# Patient Record
Sex: Female | Born: 1950 | Race: White | Hispanic: No | Smoking: Current every day smoker
Health system: Southern US, Community
[De-identification: ages and names within clinical notes are randomized; demographics above are authoritative.]

## PROBLEM LIST (undated history)

## (undated) DIAGNOSIS — M797 Fibromyalgia: Secondary | ICD-10-CM

---

## 2021-09-01 ENCOUNTER — Ambulatory Visit: Payer: Self-pay | Admitting: Nurse Practitioner

## 2021-09-07 ENCOUNTER — Inpatient Hospital Stay
Admission: EM | Admit: 2021-09-07 | Discharge: 2021-09-24 | DRG: 391 | Disposition: A | Discharge status: Home Health | Payer: Medicare Other | Attending: Family Medicine | Admitting: Family Medicine

## 2021-09-07 ENCOUNTER — Emergency Department: Payer: Medicare Other

## 2021-09-07 ENCOUNTER — Encounter: Payer: Self-pay | Admitting: Internal Medicine

## 2021-09-07 ENCOUNTER — Other Ambulatory Visit: Payer: Self-pay

## 2021-09-07 DIAGNOSIS — R4182 Altered mental status, unspecified: Secondary | ICD-10-CM | POA: Diagnosis not present

## 2021-09-07 DIAGNOSIS — I5032 Chronic diastolic (congestive) heart failure: Secondary | ICD-10-CM | POA: Diagnosis present

## 2021-09-07 DIAGNOSIS — K529 Noninfective gastroenteritis and colitis, unspecified: Secondary | ICD-10-CM | POA: Diagnosis not present

## 2021-09-07 DIAGNOSIS — J849 Interstitial pulmonary disease, unspecified: Secondary | ICD-10-CM | POA: Diagnosis present

## 2021-09-07 DIAGNOSIS — J984 Other disorders of lung: Secondary | ICD-10-CM | POA: Diagnosis present

## 2021-09-07 DIAGNOSIS — R319 Hematuria, unspecified: Secondary | ICD-10-CM | POA: Diagnosis not present

## 2021-09-07 DIAGNOSIS — R112 Nausea with vomiting, unspecified: Secondary | ICD-10-CM | POA: Diagnosis not present

## 2021-09-07 DIAGNOSIS — I493 Ventricular premature depolarization: Secondary | ICD-10-CM | POA: Diagnosis present

## 2021-09-07 DIAGNOSIS — I6503 Occlusion and stenosis of bilateral vertebral arteries: Secondary | ICD-10-CM | POA: Diagnosis present

## 2021-09-07 DIAGNOSIS — G9341 Metabolic encephalopathy: Secondary | ICD-10-CM | POA: Diagnosis present

## 2021-09-07 DIAGNOSIS — R932 Abnormal findings on diagnostic imaging of liver and biliary tract: Secondary | ICD-10-CM | POA: Diagnosis not present

## 2021-09-07 DIAGNOSIS — I776 Arteritis, unspecified: Secondary | ICD-10-CM | POA: Diagnosis present

## 2021-09-07 DIAGNOSIS — J441 Chronic obstructive pulmonary disease with (acute) exacerbation: Secondary | ICD-10-CM | POA: Diagnosis present

## 2021-09-07 DIAGNOSIS — J9811 Atelectasis: Secondary | ICD-10-CM | POA: Diagnosis present

## 2021-09-07 DIAGNOSIS — G0490 Encephalitis and encephalomyelitis, unspecified: Secondary | ICD-10-CM | POA: Diagnosis not present

## 2021-09-07 DIAGNOSIS — G894 Chronic pain syndrome: Secondary | ICD-10-CM | POA: Diagnosis present

## 2021-09-07 DIAGNOSIS — N390 Urinary tract infection, site not specified: Secondary | ICD-10-CM

## 2021-09-07 DIAGNOSIS — R1319 Other dysphagia: Secondary | ICD-10-CM | POA: Diagnosis not present

## 2021-09-07 DIAGNOSIS — F061 Catatonic disorder due to known physiological condition: Secondary | ICD-10-CM | POA: Diagnosis not present

## 2021-09-07 DIAGNOSIS — B9620 Unspecified Escherichia coli [E. coli] as the cause of diseases classified elsewhere: Secondary | ICD-10-CM | POA: Diagnosis present

## 2021-09-07 DIAGNOSIS — E0500 Thyrotoxicosis with diffuse goiter without thyrotoxic crisis or storm: Secondary | ICD-10-CM | POA: Diagnosis present

## 2021-09-07 DIAGNOSIS — I639 Cerebral infarction, unspecified: Secondary | ICD-10-CM

## 2021-09-07 DIAGNOSIS — I110 Hypertensive heart disease with heart failure: Secondary | ICD-10-CM | POA: Diagnosis present

## 2021-09-07 DIAGNOSIS — E43 Unspecified severe protein-calorie malnutrition: Secondary | ICD-10-CM | POA: Diagnosis not present

## 2021-09-07 DIAGNOSIS — I7143 Infrarenal abdominal aortic aneurysm, without rupture: Secondary | ICD-10-CM | POA: Diagnosis present

## 2021-09-07 DIAGNOSIS — E0590 Thyrotoxicosis, unspecified without thyrotoxic crisis or storm: Secondary | ICD-10-CM | POA: Diagnosis not present

## 2021-09-07 DIAGNOSIS — E872 Acidosis, unspecified: Secondary | ICD-10-CM | POA: Diagnosis present

## 2021-09-07 DIAGNOSIS — Z681 Body mass index (BMI) 19 or less, adult: Secondary | ICD-10-CM | POA: Diagnosis not present

## 2021-09-07 DIAGNOSIS — E860 Dehydration: Secondary | ICD-10-CM | POA: Diagnosis not present

## 2021-09-07 DIAGNOSIS — K821 Hydrops of gallbladder: Secondary | ICD-10-CM | POA: Diagnosis not present

## 2021-09-07 DIAGNOSIS — M47816 Spondylosis without myelopathy or radiculopathy, lumbar region: Secondary | ICD-10-CM | POA: Diagnosis present

## 2021-09-07 DIAGNOSIS — E870 Hyperosmolality and hypernatremia: Secondary | ICD-10-CM | POA: Diagnosis present

## 2021-09-07 DIAGNOSIS — Z20822 Contact with and (suspected) exposure to covid-19: Secondary | ICD-10-CM | POA: Diagnosis present

## 2021-09-07 DIAGNOSIS — T402X5A Adverse effect of other opioids, initial encounter: Secondary | ICD-10-CM | POA: Diagnosis present

## 2021-09-07 DIAGNOSIS — K5190 Ulcerative colitis, unspecified, without complications: Secondary | ICD-10-CM | POA: Diagnosis present

## 2021-09-07 DIAGNOSIS — R1011 Right upper quadrant pain: Secondary | ICD-10-CM | POA: Diagnosis not present

## 2021-09-07 DIAGNOSIS — I10 Essential (primary) hypertension: Secondary | ICD-10-CM

## 2021-09-07 DIAGNOSIS — Z1611 Resistance to penicillins: Secondary | ICD-10-CM | POA: Diagnosis present

## 2021-09-07 DIAGNOSIS — T17908A Unspecified foreign body in respiratory tract, part unspecified causing other injury, initial encounter: Secondary | ICD-10-CM

## 2021-09-07 DIAGNOSIS — E440 Moderate protein-calorie malnutrition: Secondary | ICD-10-CM | POA: Diagnosis not present

## 2021-09-07 DIAGNOSIS — T17908D Unspecified foreign body in respiratory tract, part unspecified causing other injury, subsequent encounter: Secondary | ICD-10-CM | POA: Diagnosis not present

## 2021-09-07 DIAGNOSIS — A0839 Other viral enteritis: Secondary | ICD-10-CM | POA: Diagnosis present

## 2021-09-07 DIAGNOSIS — D72829 Elevated white blood cell count, unspecified: Secondary | ICD-10-CM

## 2021-09-07 DIAGNOSIS — F1123 Opioid dependence with withdrawal: Secondary | ICD-10-CM | POA: Diagnosis present

## 2021-09-07 DIAGNOSIS — R197 Diarrhea, unspecified: Secondary | ICD-10-CM

## 2021-09-07 DIAGNOSIS — E876 Hypokalemia: Secondary | ICD-10-CM | POA: Diagnosis not present

## 2021-09-07 DIAGNOSIS — G9340 Encephalopathy, unspecified: Secondary | ICD-10-CM | POA: Diagnosis not present

## 2021-09-07 DIAGNOSIS — F17210 Nicotine dependence, cigarettes, uncomplicated: Secondary | ICD-10-CM | POA: Diagnosis present

## 2021-09-07 DIAGNOSIS — T17418A Gastric contents in trachea causing other injury, initial encounter: Secondary | ICD-10-CM | POA: Diagnosis present

## 2021-09-07 DIAGNOSIS — R0602 Shortness of breath: Secondary | ICD-10-CM

## 2021-09-07 DIAGNOSIS — N342 Other urethritis: Secondary | ICD-10-CM | POA: Diagnosis not present

## 2021-09-07 DIAGNOSIS — J00 Acute nasopharyngitis [common cold]: Secondary | ICD-10-CM | POA: Diagnosis present

## 2021-09-07 DIAGNOSIS — R079 Chest pain, unspecified: Secondary | ICD-10-CM

## 2021-09-07 DIAGNOSIS — B258 Other cytomegaloviral diseases: Secondary | ICD-10-CM | POA: Diagnosis present

## 2021-09-07 DIAGNOSIS — R0682 Tachypnea, not elsewhere classified: Secondary | ICD-10-CM | POA: Diagnosis present

## 2021-09-07 DIAGNOSIS — E785 Hyperlipidemia, unspecified: Secondary | ICD-10-CM | POA: Diagnosis present

## 2021-09-07 DIAGNOSIS — R1310 Dysphagia, unspecified: Secondary | ICD-10-CM | POA: Diagnosis not present

## 2021-09-07 DIAGNOSIS — Z79899 Other long term (current) drug therapy: Secondary | ICD-10-CM

## 2021-09-07 DIAGNOSIS — I4891 Unspecified atrial fibrillation: Secondary | ICD-10-CM | POA: Diagnosis not present

## 2021-09-07 DIAGNOSIS — M797 Fibromyalgia: Secondary | ICD-10-CM | POA: Diagnosis present

## 2021-09-07 DIAGNOSIS — D751 Secondary polycythemia: Secondary | ICD-10-CM | POA: Diagnosis present

## 2021-09-07 HISTORY — DX: Fibromyalgia: M79.7

## 2021-09-07 LAB — CBC WITH DIFFERENTIAL/PLATELET
Abs Immature Granulocytes: 0.06 10*3/uL (ref 0.00–0.07)
Basophils Absolute: 0 10*3/uL (ref 0.0–0.1)
Basophils Relative: 0 %
Eosinophils Absolute: 0 10*3/uL (ref 0.0–0.5)
Eosinophils Relative: 0 %
HCT: 47.1 % — ABNORMAL HIGH (ref 36.0–46.0)
Hemoglobin: 16.2 g/dL — ABNORMAL HIGH (ref 12.0–15.0)
Immature Granulocytes: 0 %
Lymphocytes Relative: 5 %
Lymphs Abs: 0.7 10*3/uL (ref 0.7–4.0)
MCH: 32 pg (ref 26.0–34.0)
MCHC: 34.4 g/dL (ref 30.0–36.0)
MCV: 92.9 fL (ref 80.0–100.0)
Monocytes Absolute: 0.5 10*3/uL (ref 0.1–1.0)
Monocytes Relative: 4 %
Neutro Abs: 12.2 10*3/uL — ABNORMAL HIGH (ref 1.7–7.7)
Neutrophils Relative %: 91 %
Platelets: 263 10*3/uL (ref 150–400)
RBC: 5.07 MIL/uL (ref 3.87–5.11)
RDW: 12.6 % (ref 11.5–15.5)
WBC: 13.7 10*3/uL — ABNORMAL HIGH (ref 4.0–10.5)
nRBC: 0 % (ref 0.0–0.2)
nRBC: 0 /100 WBC

## 2021-09-07 LAB — COMPREHENSIVE METABOLIC PANEL
ALT: 25 U/L (ref 0–44)
AST: 36 U/L (ref 15–41)
Albumin: 3.2 g/dL — ABNORMAL LOW (ref 3.5–5.0)
Alkaline Phosphatase: 144 U/L — ABNORMAL HIGH (ref 38–126)
Anion gap: 17 — ABNORMAL HIGH (ref 5–15)
BUN: 24 mg/dL — ABNORMAL HIGH (ref 8–23)
CO2: 23 mmol/L (ref 22–32)
Calcium: 9.4 mg/dL (ref 8.9–10.3)
Chloride: 104 mmol/L (ref 98–111)
Creatinine, Ser: 0.92 mg/dL (ref 0.44–1.00)
GFR, Estimated: >60 mL/min (ref >60)
Glucose, Bld: 117 mg/dL — ABNORMAL HIGH (ref 70–99)
Potassium: 2.7 mmol/L — CL (ref 3.5–5.1)
Sodium: 144 mmol/L (ref 135–145)
Total Bilirubin: 0.5 mg/dL (ref 0.3–1.2)
Total Protein: 7.6 g/dL (ref 6.5–8.1)

## 2021-09-07 LAB — GASTROINTESTINAL PANEL BY PCR, STOOL (REPLACES STOOL CULTURE)

## 2021-09-07 LAB — URINALYSIS, ROUTINE W REFLEX MICROSCOPIC
Bilirubin Urine: NEGATIVE
Glucose, UA: NEGATIVE mg/dL
Ketones, ur: 20 mg/dL — AB
Nitrite: POSITIVE — AB
Protein, ur: 30 mg/dL — AB
Specific Gravity, Urine: 1.012 (ref 1.005–1.030)
WBC, UA: >50 WBC/hpf — ABNORMAL HIGH (ref 0–5)
pH: 6 (ref 5.0–8.0)

## 2021-09-07 LAB — C DIFFICILE QUICK SCREEN W PCR REFLEX
C Diff antigen: NEGATIVE
C Diff interpretation: NOT DETECTED
C Diff toxin: NEGATIVE

## 2021-09-07 LAB — CBC
HCT: 47.7 % — ABNORMAL HIGH (ref 36.0–46.0)
Hemoglobin: 16.3 g/dL — ABNORMAL HIGH (ref 12.0–15.0)
MCH: 31.8 pg (ref 26.0–34.0)
MCHC: 34.2 g/dL (ref 30.0–36.0)
MCV: 93.2 fL (ref 80.0–100.0)
Platelets: 259 10*3/uL (ref 150–400)
RBC: 5.12 MIL/uL — ABNORMAL HIGH (ref 3.87–5.11)
RDW: 12.5 % (ref 11.5–15.5)
WBC: 13.6 10*3/uL — ABNORMAL HIGH (ref 4.0–10.5)
nRBC: 0 % (ref 0.0–0.2)

## 2021-09-07 LAB — RESP PANEL BY RT-PCR (FLU A&B, COVID) ARPGX2
Influenza A by PCR: NEGATIVE
Influenza B by PCR: NEGATIVE
SARS Coronavirus 2 by RT PCR: NEGATIVE

## 2021-09-07 LAB — LIPASE, BLOOD: Lipase: 26 U/L (ref 11–51)

## 2021-09-07 LAB — TROPONIN I (HIGH SENSITIVITY)
Troponin I (High Sensitivity): 45 ng/L — ABNORMAL HIGH (ref <18)
Troponin I (High Sensitivity): 48 ng/L — ABNORMAL HIGH (ref <18)

## 2021-09-07 LAB — LACTIC ACID, PLASMA: Lactic Acid, Venous: 1.3 mmol/L (ref 0.5–1.9)

## 2021-09-07 LAB — MAGNESIUM: Magnesium: 1.9 mg/dL (ref 1.7–2.4)

## 2021-09-07 LAB — SEDIMENTATION RATE: Sed Rate: 44 mm/hr — ABNORMAL HIGH (ref 0–30)

## 2021-09-07 IMAGING — DX DG CHEST 1V PORT
1 series · 1 of 1 positions shown · non-contrast
Comparison: None.

CLINICAL DATA: Altered mental status.  Weakness.

EXAM:
PORTABLE CHEST 1 VIEW

[chest ap]
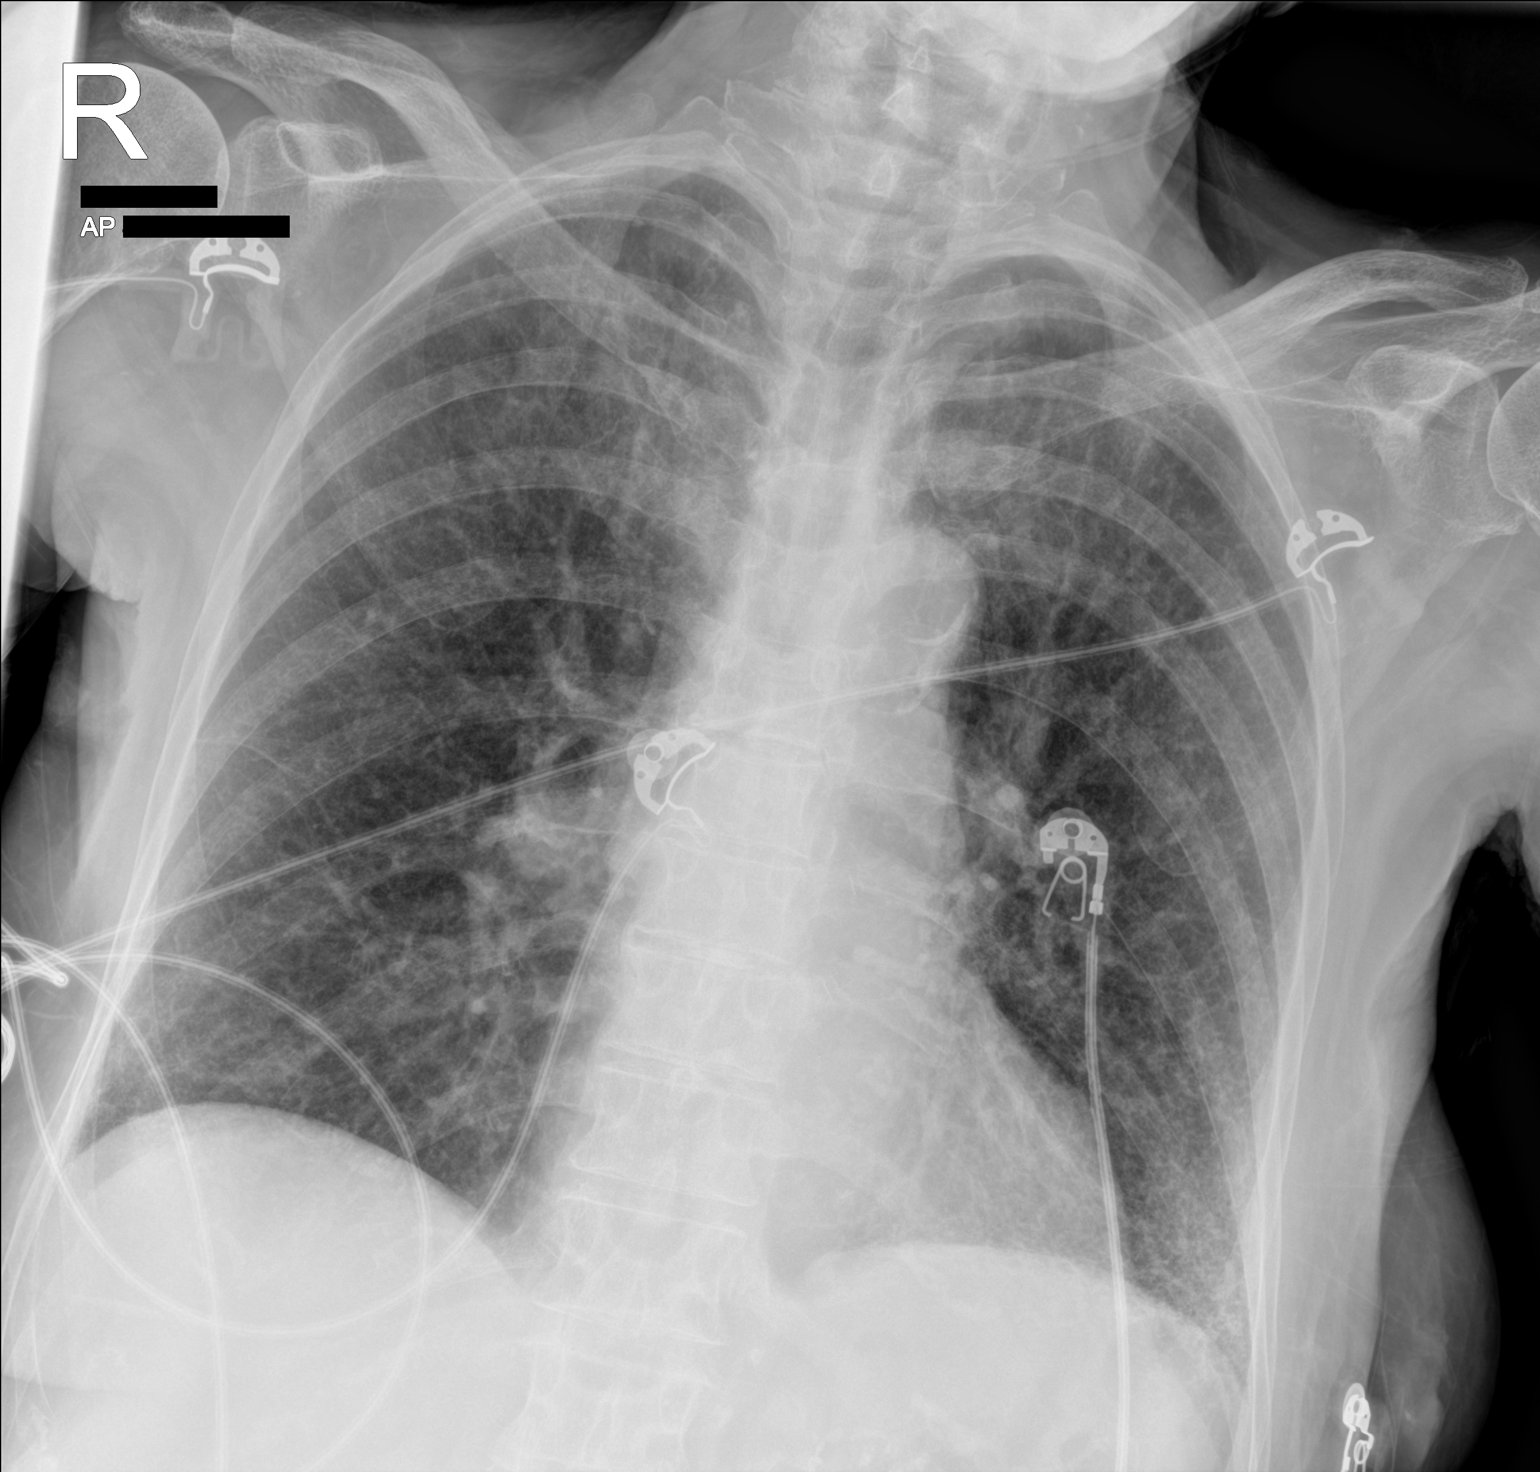

[1 of 1 positions shown; findings below may reference images not displayed]

FINDINGS: [PC] hours. Lungs are hyperexpanded. Interstitial markings are
diffusely coarsened with chronic features. Subtle disease at the
left base may reflect chronic interstitial changes although subtle
pneumonia not excluded. The cardiopericardial silhouette is within
normal limits for size. The visualized bony structures of the thorax
are unremarkable. Telemetry leads overlie the chest.
IMPRESSION: 1. Hyperexpansion with chronic interstitial coarsening.
2. Subtle opacity at the left base may reflect chronic interstitial
changes although subtle pneumonia not excluded.

## 2021-09-07 IMAGING — US US ABDOMEN LIMITED
1 series · 14 of 25 positions shown · non-contrast
Comparison: Abdominopelvic CT same date.

CLINICAL DATA: Right upper quadrant abdominal pain.

EXAM:
ULTRASOUND ABDOMEN LIMITED RIGHT UPPER QUADRANT

[Series 1: us abdomen limited ruq (liver/gb) · 14 of 116 slices shown]
[im 1/116]
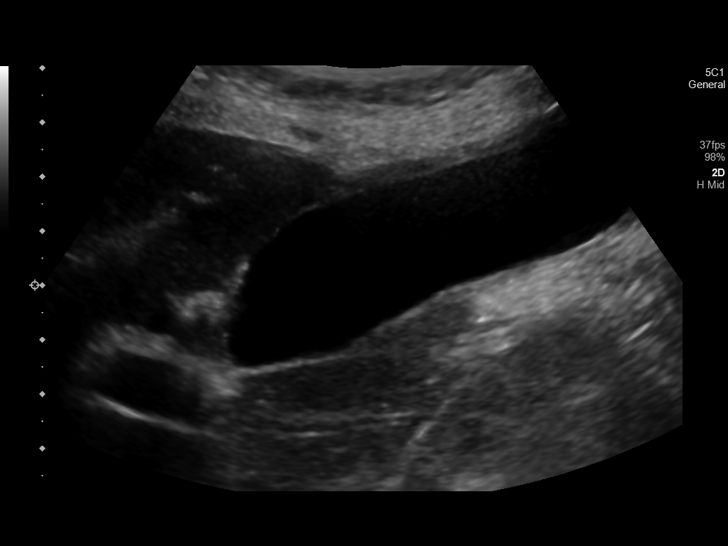
[im 10/116]
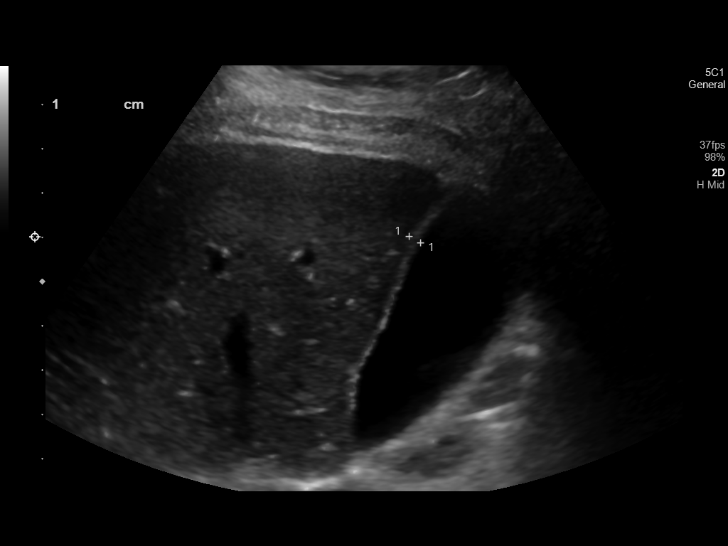
[im 20/116]
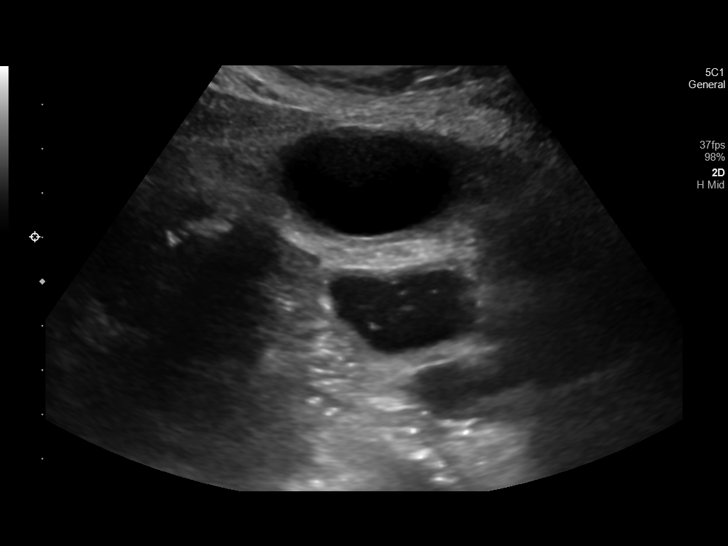
[im 29/116]
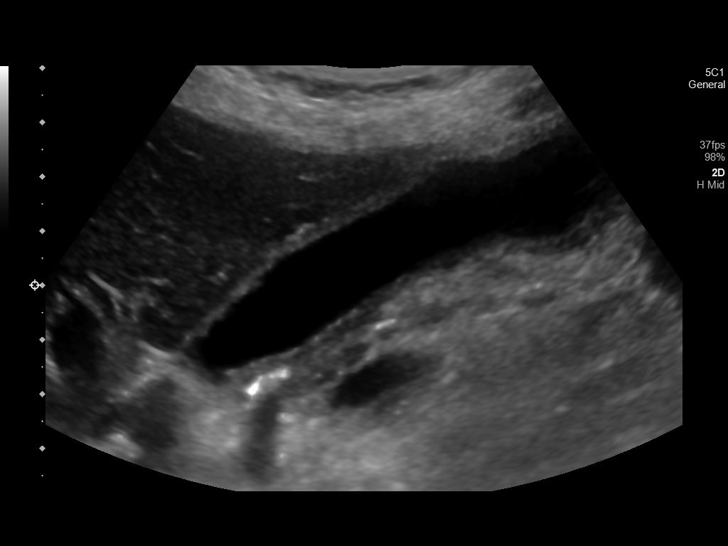
[im 39/116]
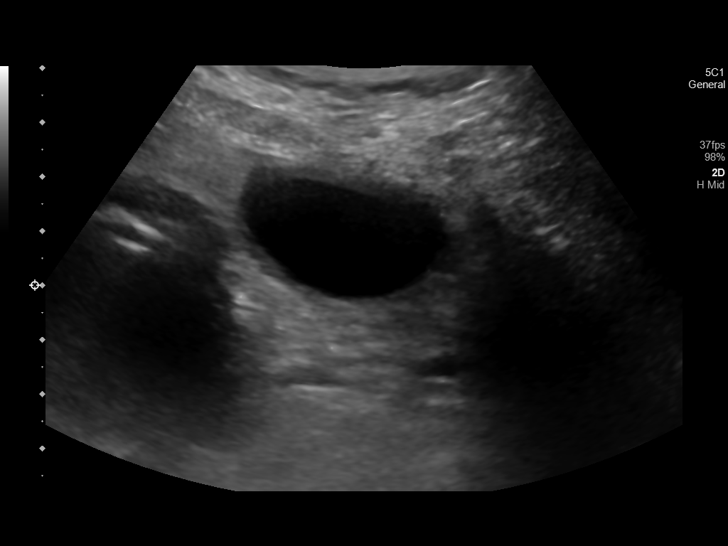
[im 44/116]
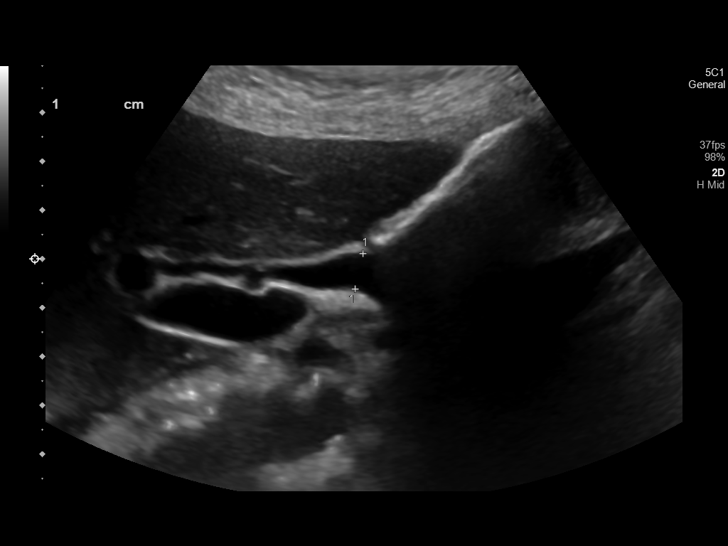
[im 53/116]
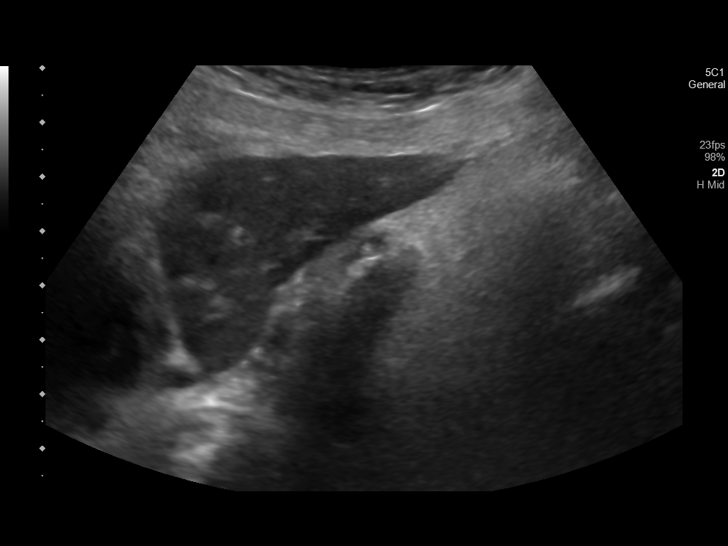
[im 63/116]
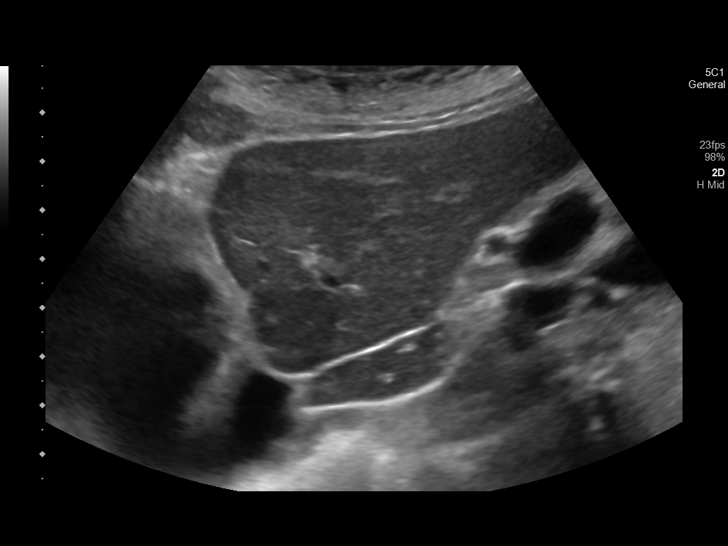
[im 72/116]
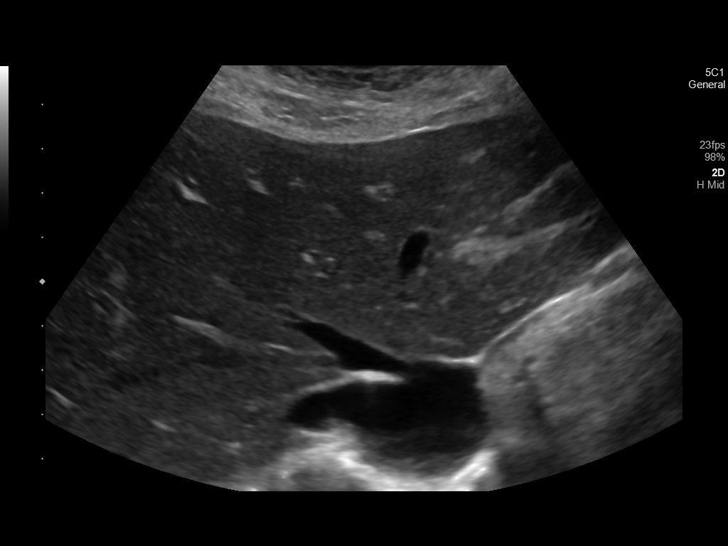
[im 77/116]
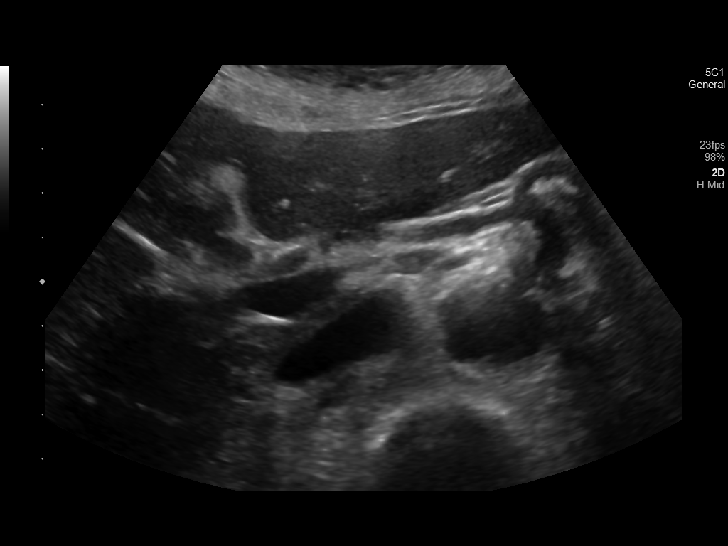
[im 87/116]
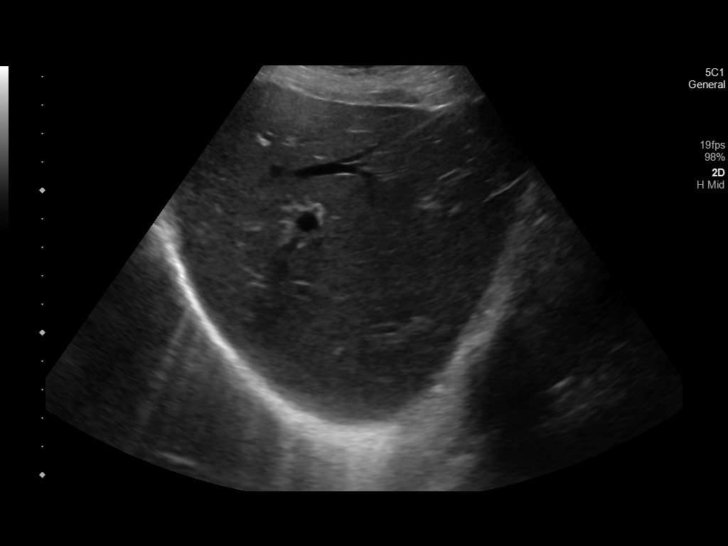
[im 96/116]
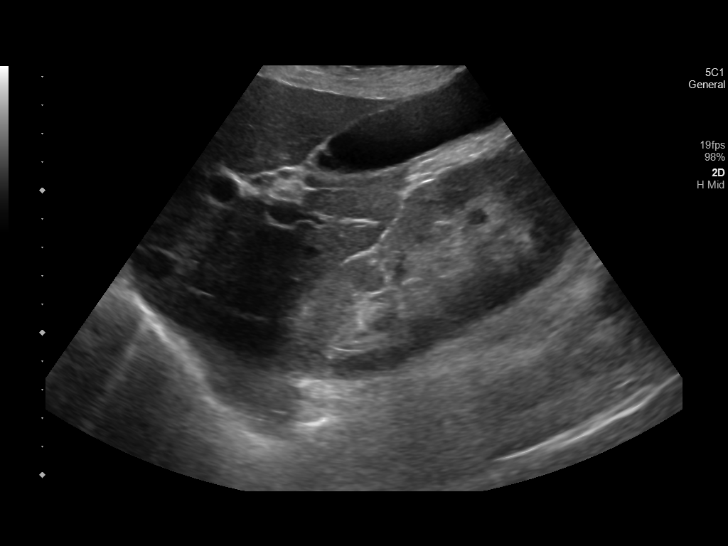
[im 106/116]
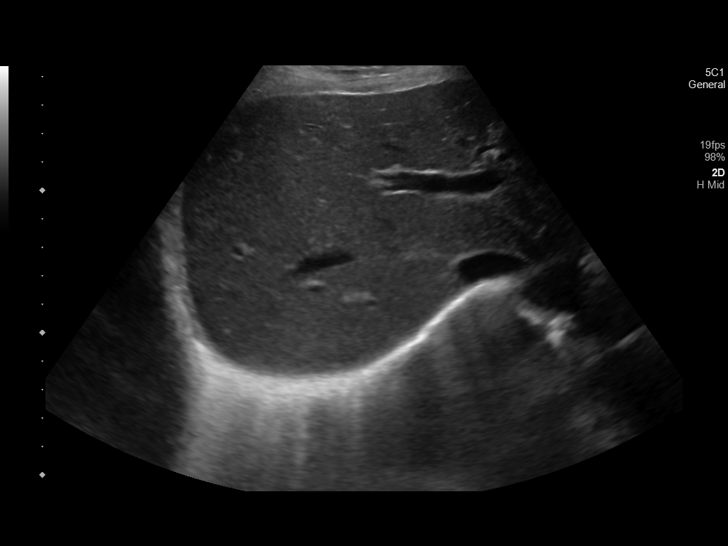
[im 116/116]
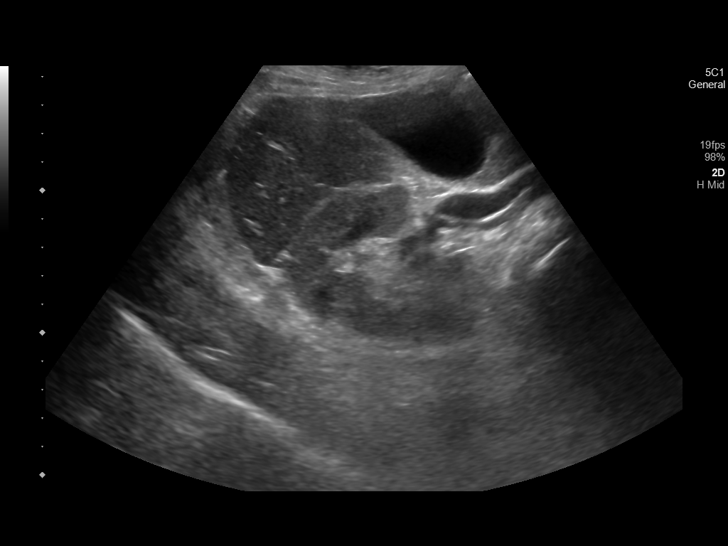

[14 of 25 positions shown; findings below may reference images not displayed]

FINDINGS: Gallbladder:

Borderline gallbladder wall thickening to 3-4 mm. The gallbladder is
mildly distended, measuring up to 8.7 cm in length. No evidence of
cholelithiasis, pericholecystic fluid or sonographic Murphy sign.

Common bile duct:

Diameter: 8 mm. The common bile duct tapers distally. No evidence of
choledocholithiasis or intrahepatic biliary dilatation.

Liver:

No focal lesion identified. Within normal limits in parenchymal
echogenicity. Portal vein is patent on color Doppler imaging with
normal direction of blood flow towards the liver.

Other: None.
IMPRESSION: 1. Nonspecific borderline gallbladder hydrops with borderline
gallbladder wall thickening. No evidence of cholelithiasis or
sonographic Murphy sign.
2. Mild extrahepatic biliary dilatation without evidence of
choledocholithiasis.

## 2021-09-07 IMAGING — CT CT HEAD W/O CM
4 series · 16 of 47 positions shown, 18 images · non-contrast
Comparison: None.

CLINICAL DATA: Nausea, vomiting and diarrhea, altered mental status



[Series 2: head wo · axial · 0.41mm/px · z∈[+231,+356]mm · 7 of 35 slices shown, 9 images]
[im 5/35  brain]
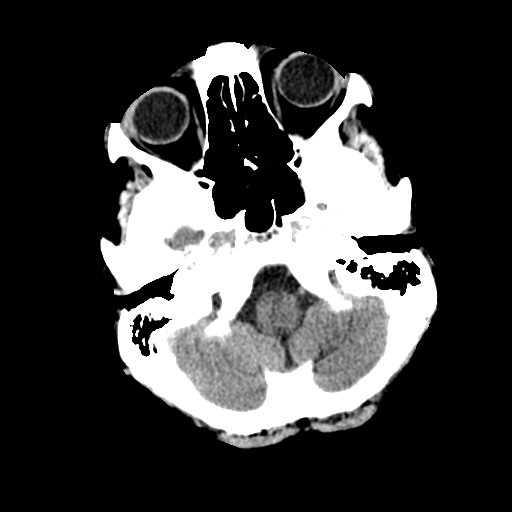
[im 5/35  bone]
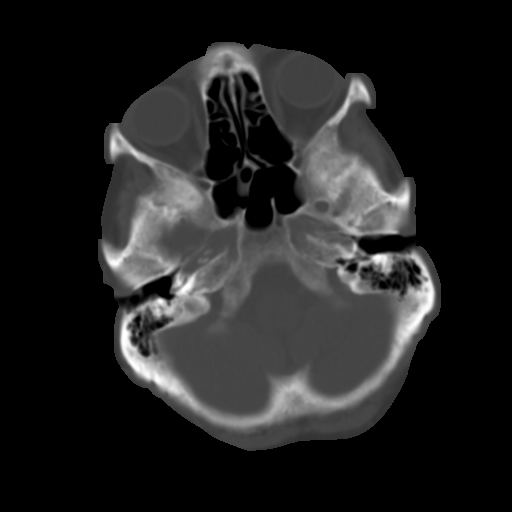
[im 9/35  brain]
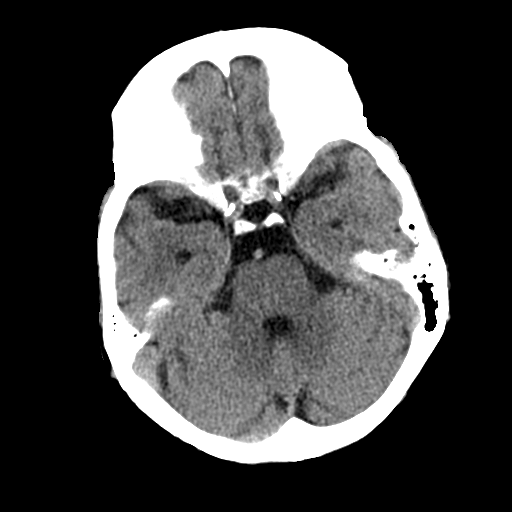
[im 13/35  brain]
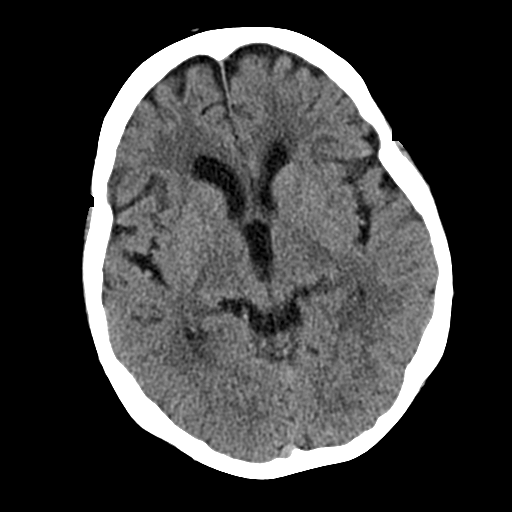
[im 18/35  brain]
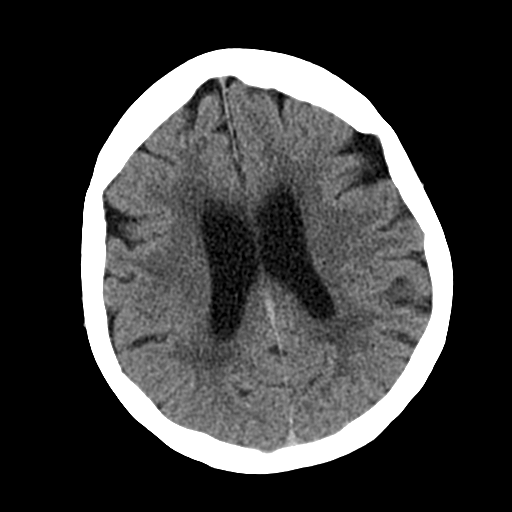
[im 22/35  brain]
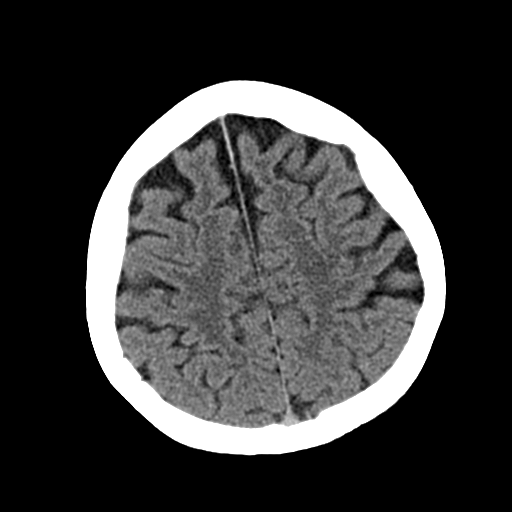
[im 22/35  bone]
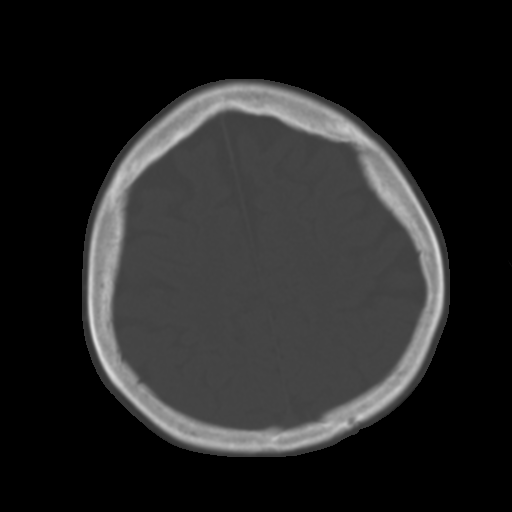
[im 26/35  brain]
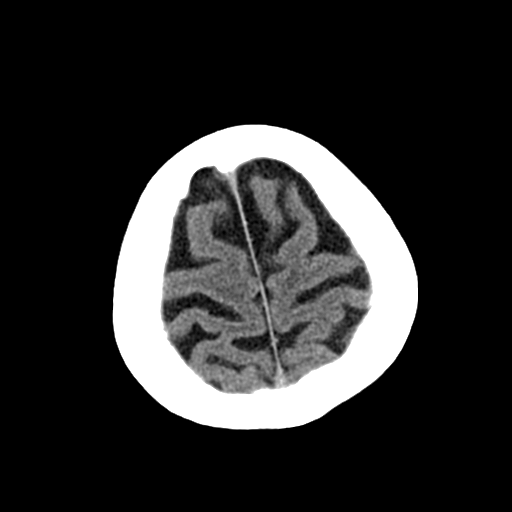
[im 30/35  brain]
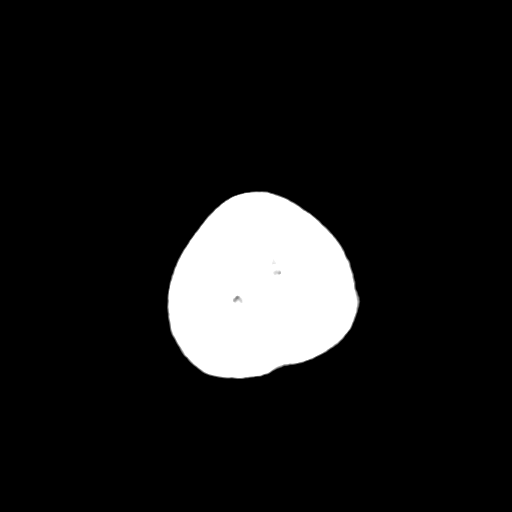

[Series 3: head bone · axial · 0.41mm/px · z∈[+227,+261]mm · 3 of 86 slices shown]
[im 9/86  bone]
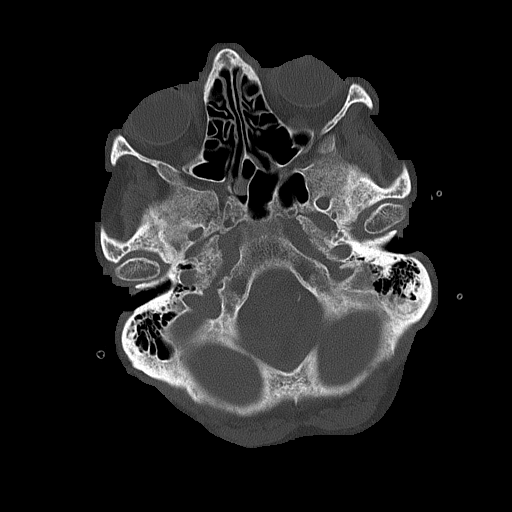
[im 18/86  bone]
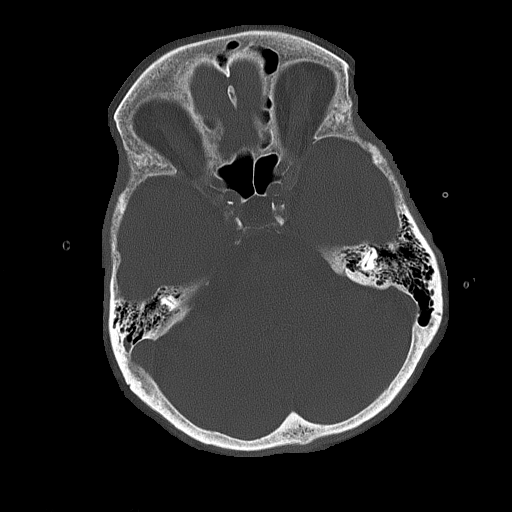
[im 26/86  bone]
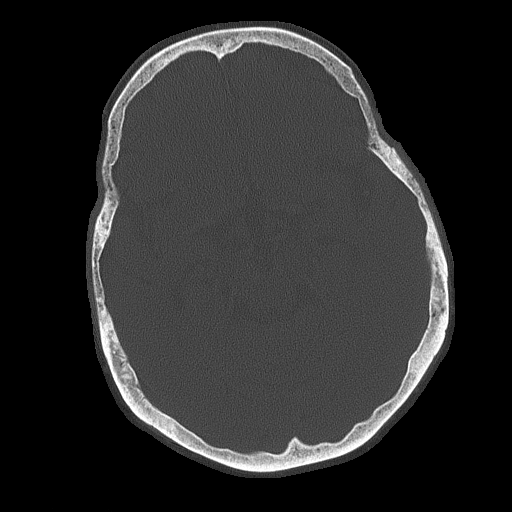

[Series 4: coronal soft tissue · coronal · 0.32mm/px · 3 of 63 slices shown]
[im 21/63  brain]
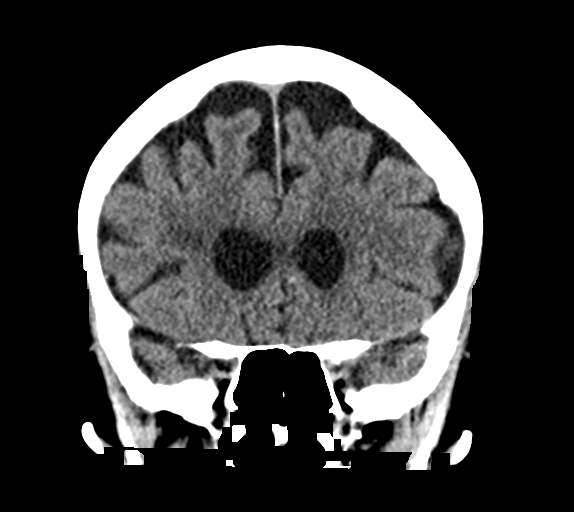
[im 28/63  brain]
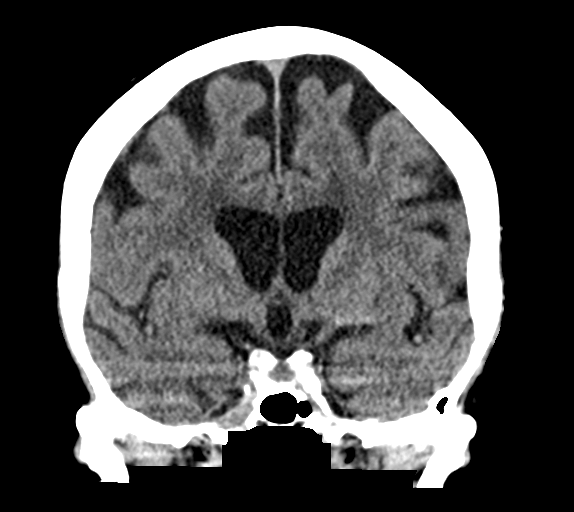
[im 35/63  brain]
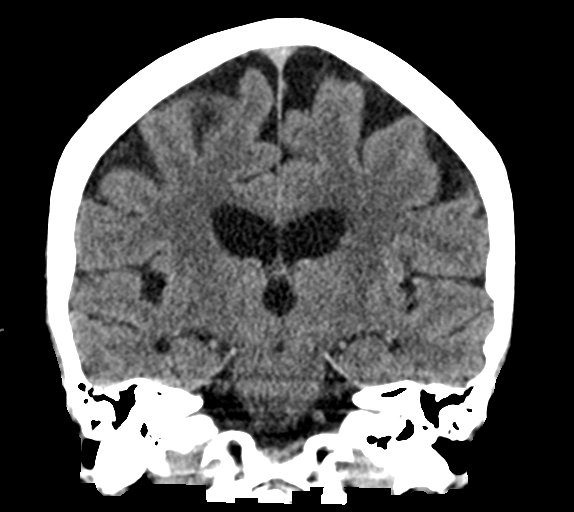

[Series 5: sagittal soft tissue · sagittal · 0.33mm/px · 3 of 57 slices shown]
[im 19/57  brain]
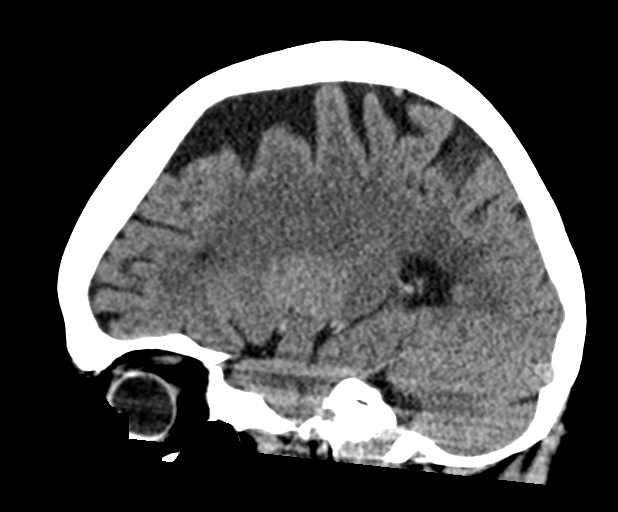
[im 29/57  brain]
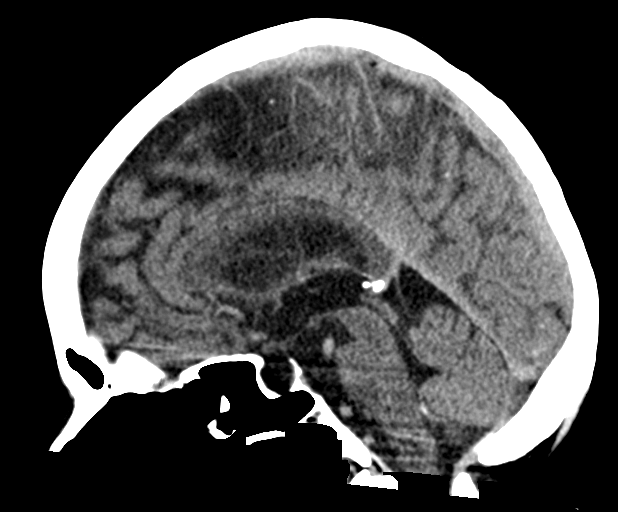
[im 38/57  brain]
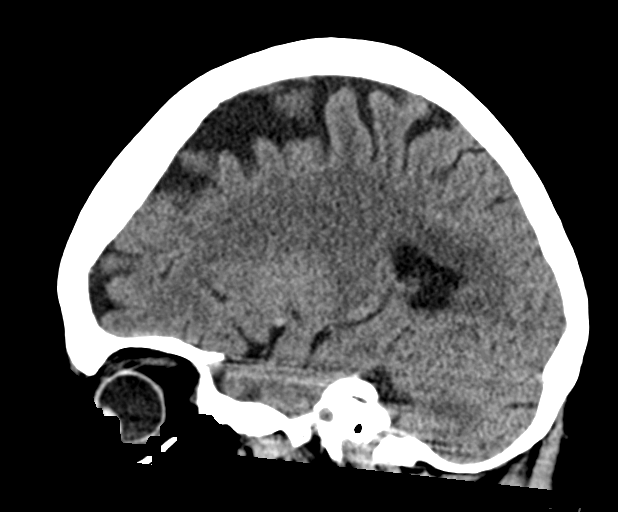

[16 of 47 positions shown; findings below may reference images not displayed]

FINDINGS: Brain: Mild atrophy pattern and white matter microvascular ischemic
changes. No acute intracranial hemorrhage, new mass lesion, new
infarction, midline shift, herniation, hydrocephalus, or extra-axial
fluid collection. No cerebellar abnormality.

Vascular: Intracranial atherosclerosis. No hyperdense vessel.

Skull: Normal. Negative for fracture or focal lesion.

Sinuses/Orbits: No acute finding.

Other: None.
IMPRESSION: 1. Mild atrophy and white matter microvascular ischemic changes.
2. No acute intracranial abnormality by noncontrast CT.

## 2021-09-07 IMAGING — CT CT ABD-PELV W/ CM
2 of 5 series · 15 of 46 positions shown, 17 images · IV contrast (APPLIED)
Comparison: None.

CLINICAL DATA: Abdominal pain

EXAM:
CT ABDOMEN AND PELVIS WITH CONTRAST
TECHNIQUE: Multidetector CT imaging of the abdomen and pelvis was performed
using the standard protocol following bolus administration of
intravenous contrast.

[Series 3: routine abd/pel with · axial · 0.83mm/px · z∈[-495,-95]mm · 12 of 92 slices shown, 14 images]
[im 6/92  soft-tissue]
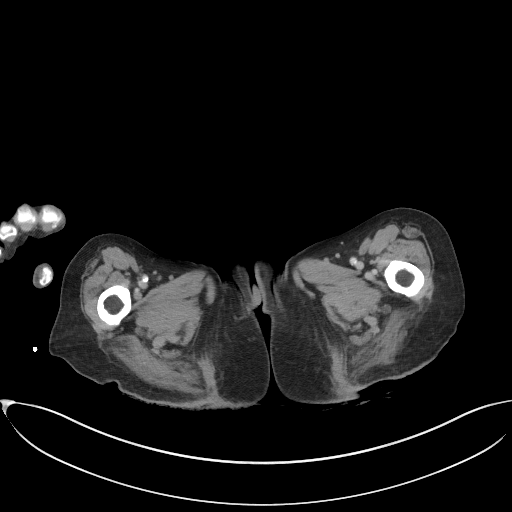
[im 6/92  bone]
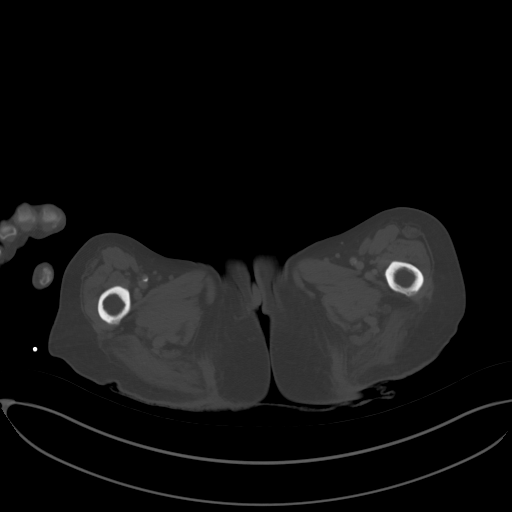
[im 16/92  soft-tissue]
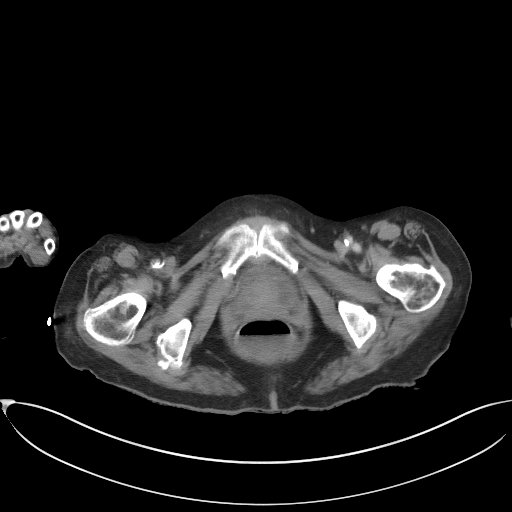
[im 21/92  soft-tissue]
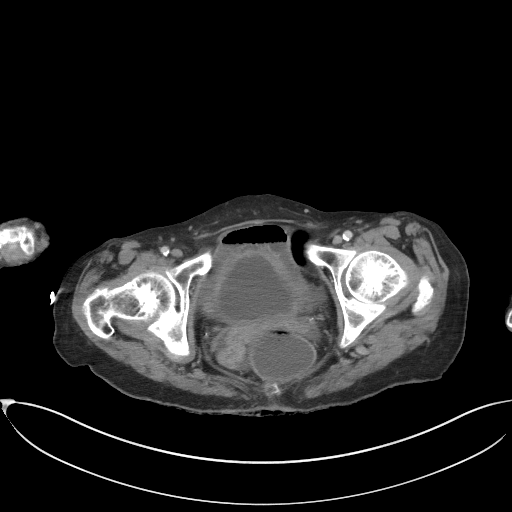
[im 26/92  soft-tissue]
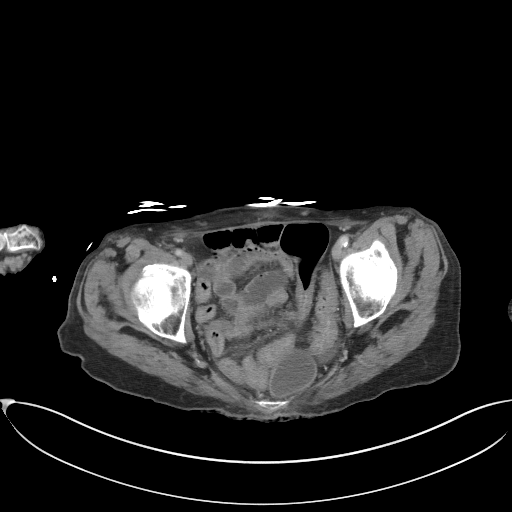
[im 36/92  soft-tissue]
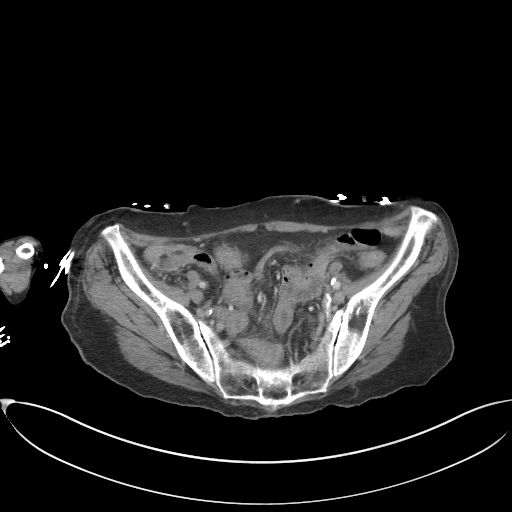
[im 41/92  soft-tissue]
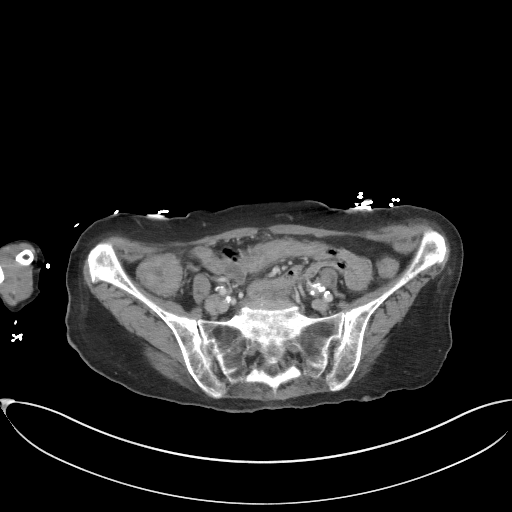
[im 51/92  soft-tissue]
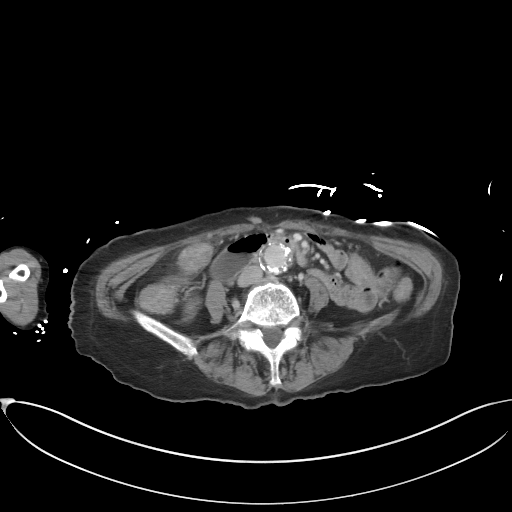
[im 56/92  soft-tissue]
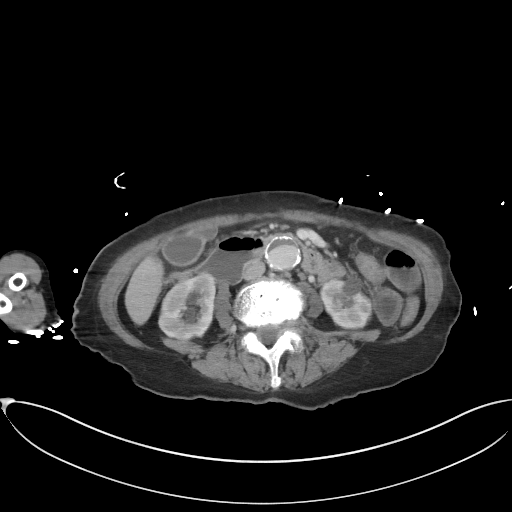
[im 66/92  soft-tissue]
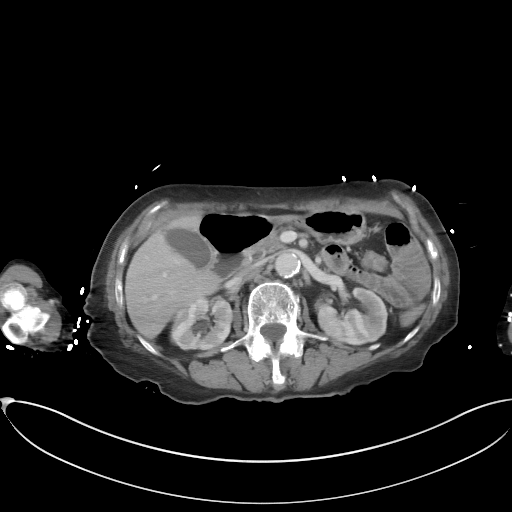
[im 66/92  bone]
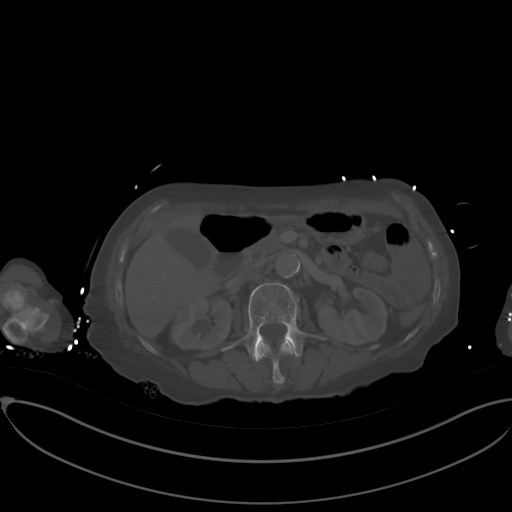
[im 71/92  soft-tissue]
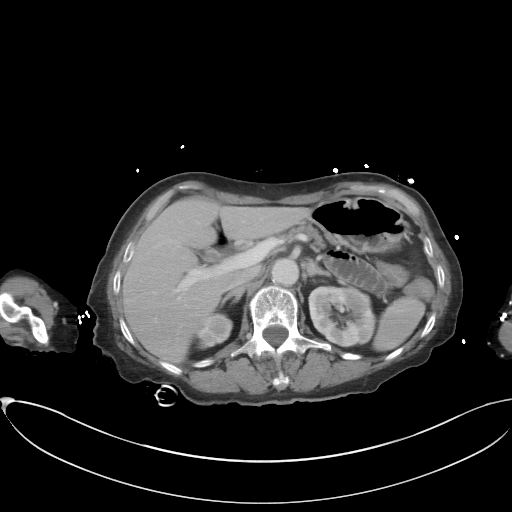
[im 76/92  soft-tissue]
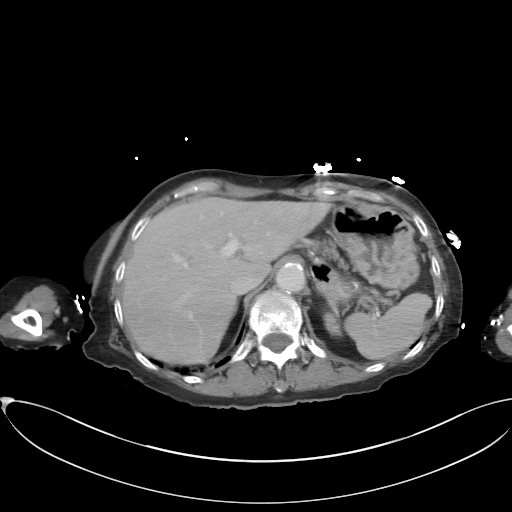
[im 86/92  soft-tissue]
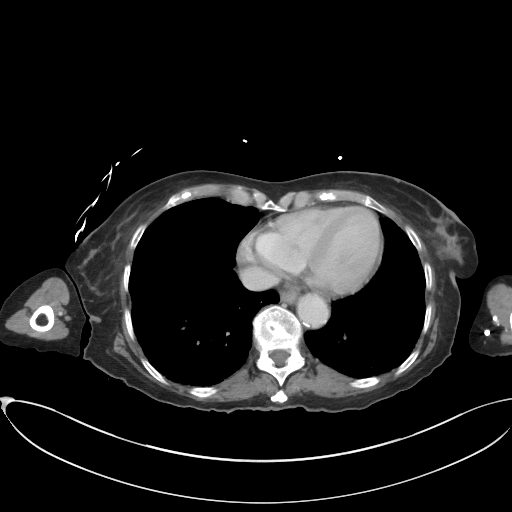

[Series 7: coronal st · coronal · 0.76mm/px · 3 of 74 slices shown]
[im 25/74  soft-tissue]
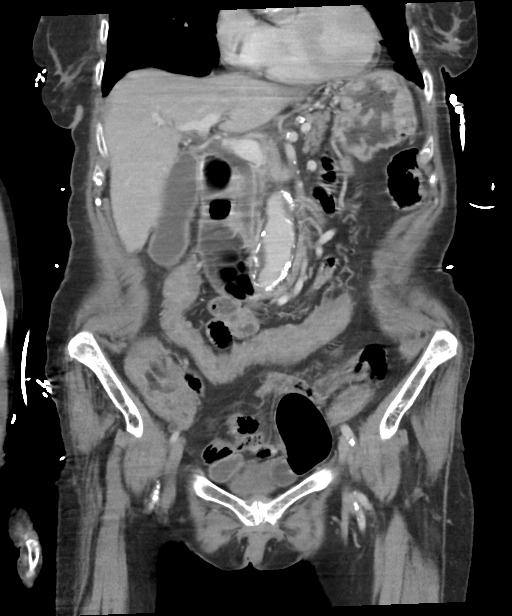
[im 33/74  soft-tissue]
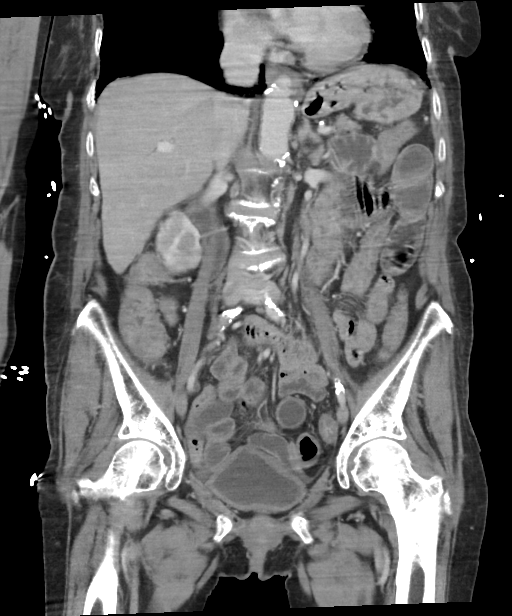
[im 41/74  soft-tissue]
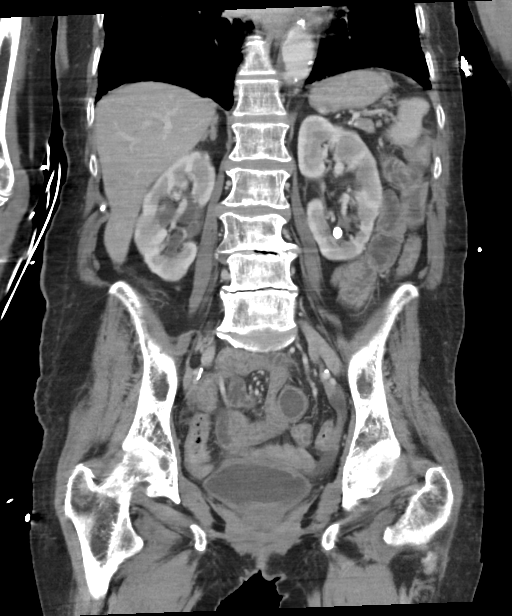

[15 of 46 positions shown; findings below may reference images not displayed]

RADIATION DOSE REDUCTION: This exam was performed according to the
departmental dose-optimization program which includes automated
exposure control, adjustment of the mA and/or kV according to
patient size and/or use of iterative reconstruction technique.

CONTRAST:  80mL OMNIPAQUE IOHEXOL 300 MG/ML  SOLN
FINDINGS: Lower chest: Increased interstitial markings in the periphery of
both lower lung fields may suggest scarring.

Hepatobiliary: No focal abnormality is seen in the liver.
Gallbladder is distended. There is mild diffuse wall thickening in
the gallbladder. There is no fluid around the gallbladder. Distal
common bile duct in the head of the pancreas measures 8 mm in
diameter.

Pancreas: There is atrophy.  No focal abnormality is seen.

Spleen: Unremarkable.

Adrenals/Urinary Tract: Adrenals are unremarkable. There is no
hydronephrosis. There are scattered calcifications in the right
renal artery branches. No definite renal or ureteral stones are
seen. Urinary bladder is not distended. There is mild diffuse wall
thickening in the bladder which may be due to incomplete distention
or cystitis.

Stomach/Bowel: Stomach is unremarkable. There is mild dilation of
proximal small bowel loops. There is mild diffuse wall thickening in
the distal small bowel loops. Appendix is not dilated. There is
diffuse wall thickening in the colon. Liquid stool is seen in the
the lumen of rectosigmoid.

Vascular/Lymphatic: There are scattered atherosclerotic plaques and
calcifications. There is ectasia of infrarenal abdominal aorta
measuring 3.4 x 3.3 cm. There is no demonstrable retroperitoneal
hematoma. There are coarse calcifications in the major branches of
aorta. There is possible high-grade stenosis in right common iliac
artery

Reproductive: Uterus is not seen.

Other: There is no pneumoperitoneum. Minimal amount of free fluid is
seen in the pelvis, possibly due to inflammation of distal small
bowel loops and colon.

Musculoskeletal: Degenerative changes are noted in the lumbar spine
with disc space narrowing, bony spurs, facet hypertrophy and
encroachment of neural foramina at multiple levels, more so at L3-L4
L4-L5 and L5-S1 levels.
IMPRESSION: There is diffuse wall thickening in colon. There is diffuse wall
thickening in the distal small bowel loops. Findings suggest
inflammatory or infectious enterocolitis.

There is no evidence of intestinal obstruction or pneumoperitoneum.
There is no hydronephrosis.

Gallbladder is distended with wall thickening. This may be due to
chronic cholecystitis. If there are focal symptoms in the right
upper quadrant, gallbladder sonogram may be considered.

There is 3.4 cm infrarenal aortic aneurysm. Lumbar spondylosis with
encroachment of neural foramina from L3-S1 levels.

Other findings as described in the body of the report.

## 2021-09-07 MED ORDER — CEFTRIAXONE SODIUM 1 G IJ SOLR
1.0000 g | Freq: Once | INTRAMUSCULAR | Status: AC
  Administered 2021-09-07: 1 g via INTRAVENOUS
  Filled 2021-09-07: qty 10

## 2021-09-07 MED ORDER — ALBUTEROL SULFATE (2.5 MG/3ML) 0.083% IN NEBU
3.0000 mL | INHALATION_SOLUTION | Freq: Four times a day (QID) | RESPIRATORY_TRACT | Status: DC | PRN
  Filled 2021-09-07: qty 3

## 2021-09-07 MED ORDER — SODIUM CHLORIDE 0.9 % IV SOLN
500.0000 mg | Freq: Once | INTRAVENOUS | Status: AC
  Administered 2021-09-07: 500 mg via INTRAVENOUS
  Filled 2021-09-07: qty 5

## 2021-09-07 MED ORDER — ONDANSETRON HCL 4 MG PO TABS: 4.0000 mg | ORAL_TABLET | Freq: Four times a day (QID) | ORAL | Status: DC | PRN

## 2021-09-07 MED ORDER — SODIUM CHLORIDE 0.9 % IV SOLN
2.0000 g | INTRAVENOUS | Status: AC
  Administered 2021-09-08 – 2021-09-13 (×6): 2 g via INTRAVENOUS
  Filled 2021-09-07 (×2): qty 2
  Filled 2021-09-07 (×4): qty 20

## 2021-09-07 MED ORDER — METRONIDAZOLE 500 MG/100ML IV SOLN
500.0000 mg | Freq: Two times a day (BID) | INTRAVENOUS | Status: DC
  Administered 2021-09-07 – 2021-09-08 (×2): 500 mg via INTRAVENOUS
  Filled 2021-09-07 (×4): qty 100

## 2021-09-07 MED ORDER — PANTOPRAZOLE SODIUM 40 MG IV SOLR
40.0000 mg | INTRAVENOUS | Status: DC
  Administered 2021-09-07 – 2021-09-22 (×16): 40 mg via INTRAVENOUS
  Filled 2021-09-07 (×15): qty 10

## 2021-09-07 MED ORDER — METRONIDAZOLE 500 MG/100ML IV SOLN
500.0000 mg | Freq: Once | INTRAVENOUS | Status: AC
  Administered 2021-09-07: 500 mg via INTRAVENOUS
  Filled 2021-09-07: qty 100

## 2021-09-07 MED ORDER — LACTATED RINGERS IV BOLUS
1000.0000 mL | Freq: Once | INTRAVENOUS | Status: AC
  Administered 2021-09-07: 1000 mL via INTRAVENOUS

## 2021-09-07 MED ORDER — ONDANSETRON HCL 4 MG/2ML IJ SOLN
4.0000 mg | Freq: Four times a day (QID) | INTRAMUSCULAR | Status: DC | PRN
  Administered 2021-09-07 – 2021-09-10 (×3): 4 mg via INTRAVENOUS
  Filled 2021-09-07 (×3): qty 2

## 2021-09-07 MED ORDER — IOHEXOL 300 MG/ML  SOLN
80.0000 mL | Freq: Once | INTRAMUSCULAR | Status: AC | PRN
  Administered 2021-09-07: 80 mL via INTRAVENOUS

## 2021-09-07 MED ORDER — POTASSIUM CHLORIDE 2 MEQ/ML IV SOLN
INTRAVENOUS | Status: DC
  Filled 2021-09-07 (×6): qty 1000

## 2021-09-07 MED ORDER — ATORVASTATIN CALCIUM 20 MG PO TABS
40.0000 mg | ORAL_TABLET | Freq: Every day | ORAL | Status: DC
  Administered 2021-09-07 – 2021-09-18 (×12): 40 mg via ORAL
  Filled 2021-09-07 (×12): qty 2

## 2021-09-07 MED ORDER — ENOXAPARIN SODIUM 40 MG/0.4ML IJ SOSY
40.0000 mg | PREFILLED_SYRINGE | INTRAMUSCULAR | Status: DC
  Administered 2021-09-07: 40 mg via SUBCUTANEOUS
  Filled 2021-09-07: qty 0.4

## 2021-09-07 MED ORDER — POTASSIUM CHLORIDE 10 MEQ/100ML IV SOLN
10.0000 meq | Freq: Once | INTRAVENOUS | Status: AC
  Administered 2021-09-07: 10 meq via INTRAVENOUS
  Filled 2021-09-07: qty 100

## 2021-09-07 NOTE — ED Notes (Signed)
Pt had large watery BM. Unable to obtain sample to send to lab. Patient cleaned. New lien and and chuck pad placed at this time. Warm blankets proved. ?

## 2021-09-07 NOTE — H&P (Signed)
History and Physical    Patient: Joy Clark GQQ:[id number] DOB: [birth year] DOA: 09/07/2021 DOS: the patient was seen and examined on 09/07/2021 PCP: Pcp, No  Patient coming from: Home  Chief Complaint:  Chief Complaint  Patient presents with   Emesis   Diarrhea   Most of the history was obtained from patient's daughter at the bedside.  HPI: Joy Clark is a 71 y.o. female with medical history significant for nicotine dependence, chronic pain syndrome, dyslipidemia who was brought into the ER by EMS for evaluation of mental status changes for about 4 days. Patient is visiting her daughter here in [place] and has been here for about 4 weeks.  At baseline she is usually awake, alert and oriented to person place and time.  Patient's daughter states that her symptoms started about 4 days ago with chills and confusion.  Since then she has had multiple episodes of emesis and diarrhea and is now incontinent of stool which is loose and has lots of mucus in it.  Patient's oral intake has been very poor over the last 4 days as well. She is awake and oriented to person but is unable to follow commands. I am unable to do review of systems on this patient due to her mental status changes.  Review of Systems: unable to review all systems due to the inability of the patient to answer questions. Past Medical History:  Diagnosis Date   Fibromyalgia    History reviewed. No pertinent surgical history. Social History:  reports that she has been smoking cigarettes. She does not have any smokeless tobacco history on file. No history on file for alcohol use and drug use.  Not on File  History reviewed. No pertinent family history.  Prior to Admission medications   Medication Sig Start Date End Date Taking? Authorizing Provider  albuterol (VENTOLIN HFA) 108 (90 Base) MCG/ACT inhaler Inhale into the lungs every 6 (six) hours as needed for wheezing or shortness of breath.   Yes [provider]   atorvastatin (LIPITOR) 40 MG tablet Take 40 mg by mouth daily.   Yes [provider]  carboxymethylcellulose 1 % ophthalmic solution 1 drop 3 (three) times daily.   Yes [provider]  celecoxib (CELEBREX) 200 MG capsule Take 200 mg by mouth 2 (two) times daily.   Yes [provider]  gabapentin (NEURONTIN) 800 MG tablet Take 800 mg by mouth 3 (three) times daily.   Yes [provider]  omeprazole (PRILOSEC) 40 MG capsule Take 40 mg by mouth daily.   Yes [provider]  ondansetron (ZOFRAN) 8 MG tablet Take by mouth every 8 (eight) hours as needed for nausea or vomiting.   Yes [provider]  oxyCODONE-acetaminophen (PERCOCET) 10-325 MG tablet Take 1 tablet by mouth every 4 (four) hours as needed for pain.   Yes [provider]    Physical Exam: Vitals:   09/07/21 1211 09/07/21 1249 09/07/21 1324 09/07/21 1423  BP: (!) 148/82 (!) 184/74 (!) 162/109 (!) 165/76  Pulse: (!) 112 (!) 103 (!) 103 99  Resp: 19 20 (!) 25 (!) 21  Temp:      TempSrc:      SpO2: 98% 98% 97% 96%  Weight:      Height:       Physical Exam Vitals and nursing note reviewed.  Constitutional:      Appearance: She is normal weight.     Comments: Oriented only to person not to place or time.  Unable to follow commands  HENT:     Head: Normocephalic and atraumatic.     Nose: Nose normal.     Mouth/Throat:     Mouth: Mucous membranes are dry.  Eyes:     Pupils: Pupils are equal, round, and reactive to light.  Cardiovascular:     Rate and Rhythm: Tachycardia present.  Pulmonary:     Effort: Pulmonary effort is normal.  Abdominal:     General: Abdomen is flat.     Palpations: Abdomen is soft.     Tenderness: There is abdominal tenderness.     Comments: Right upper quadrant tenderness  Musculoskeletal:        General: Normal range of motion.     Cervical back: Normal range of motion and neck supple.  Skin:    General: Skin is warm and dry.   Neurological:     Mental Status: She is alert.     Comments: Unable to assess.  Patient not following commands  Psychiatric:     Comments: Unable to assess     Data Reviewed: Relevant notes from primary care and specialist visits, past discharge summaries as available in EHR, including Care Everywhere. Prior diagnostic testing as pertinent to current admission diagnoses Updated medications and problem lists for reconciliation ED course, including vitals, labs, imaging, treatment and response to treatment Triage notes, nursing and pharmacy notes and ED provider's notes Notable results as noted in HPI Labs reviewed white count 13.7, hemoglobin 16.2, hematocrit 47.1, platelet count 263, potassium 2.7, BUN 24, lactic acid 1.3, troponin 45 >> 48, magnesium 1.9 Urine analysis shows significant pyuria Chest x-ray reviewed by me shows hyperinflated lung fields CT scan of the head without contrast shows Mild atrophy and white matter microvascular ischemic changes. No acute intracranial abnormality by noncontrast CT. CT scan of abdomen and pelvis shows diffuse wall thickening in colon. There is diffuse wall thickening in the distal small bowel loops. Findings suggest inflammatory or infectious enterocolitis. There is no evidence of intestinal obstruction or pneumoperitoneum. There is no hydronephrosis. Gallbladder is distended with wall thickening. This may be due to chronic cholecystitis. If there are focal symptoms in the right upper quadrant, gallbladder sonogram may be considered. 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 Gallbladder ultrasound shows Nonspecific borderline gallbladder hydrops with borderline gallbladder wall thickening. No evidence of cholelithiasis or sonographic Murphy sign. Mild extrahepatic biliary dilatation without evidence of choledocholithiasis. Twelve-lead EKG reviewed by me shows sinus  tachycardia with PVCs. There are no new results to review at this time.  Assessment and Plan: * Acute metabolic encephalopathy- (present on admission) Patient presents for evaluation of mental status changes over the last 4 days prior to her admission. She is awake and is only oriented to person not place or time. Altered mental status appears to be metabolic and may be secondary to underlying UTI Expect improvement in patient's mental status following resolution of acute infection and treatment of acute illness Patient may need an MRI of the brain if her mental status does not improve.  Hypokalemia- (present on admission) Secondary to GI losses from nausea, vomiting and diarrhea Supplement potassium Check magnesium levels  UTI (urinary tract infection)- (present on admission) Patient has significant pyuria We will treat empirically with Rocephin until urine culture results become available  Enterocolitis- (present on admission) Patient has a history of C. Difficile She has diarrhea but has not had recent antibiotic therapy We will send  stool for C. difficile toxin Treat empirically with Rocephin and Flagyl       Advance Care Planning:   Code Status: Full Code   Consults: None  Family Communication: Greater than 50% of time was spent discussing patient's condition and plan of care with her daughter at the bedside.  All questions and concerns have been addressed.  She verbalizes understanding and agrees to the plan  Severity of Illness: The appropriate patient status for this patient is INPATIENT. Inpatient status is judged to be reasonable and necessary in order to provide the required intensity of service to ensure the patient's safety. The patient's presenting symptoms, physical exam findings, and initial radiographic and laboratory data in the context of their chronic comorbidities is felt to place them at high risk for further clinical deterioration. Furthermore, it is not  anticipated that the patient will be medically stable for discharge from the hospital within 2 midnights of admission.   * I certify that at the point of admission it is my clinical judgment that the patient will require inpatient hospital care spanning beyond 2 midnights from the point of admission due to high intensity of service, high risk for further deterioration and high frequency of surveillance required.*  Author: Lucile Shutters, MD 09/07/2021 3:27 PM  For on call review [email or web address].

## 2021-09-07 NOTE — ED Notes (Signed)
Secure chat message sent to inpatient RN Despina Hidden for hand off report at this time; ?

## 2021-09-07 NOTE — ED Provider Notes (Signed)
? ?[facility] ?Provider Note ? ? ? Event Date/Time  ? First MD Initiated Contact with Patient 09/07/21 1117   ?  (approximate) ? ? ?History  ? ?Emesis and Diarrhea ? ? ?HPI ? ?Khadeja Abt is a 71 y.o. female who is very confused.  Daughter reports that she has a past history of hypertension, she had C. difficile 4 years ago.  She had a rib out of place that was hurting and a abdominal aortic aneurysm.  She had some upper back pain prior to last Friday that she thought maybe was from the rib problem.  On Friday she started with nausea and vomiting and diarrhea and became altered.  She is speaking but not anywhere near as much as usual she appears to be very confused.  For me she cannot answer any questions although she says I do not know very clearly.  She is moving all extremities equally and well. ? ?  ? ? ?Physical Exam  ? ?Triage Vital Signs: ?ED Triage Vitals  ?Enc Vitals Group  ?   BP 09/07/21 1111 (!) 184/95  ?   Pulse Rate 09/07/21 1111 (!) 114  ?   Resp 09/07/21 1111 (!) 25  ?   Temp 09/07/21 1111 98.3 ?F (36.8 ?C)  ?   Temp Source 09/07/21 1111 Axillary  ?   SpO2 09/07/21 1111 96 %  ?   Weight 09/07/21 1106 140 lb (63.5 kg)  ?   Height 09/07/21 1106 5\' 10"  (1.778 m)  ?   Head Circumference --   ?   Peak Flow --   ?   Pain Score --   ?   Pain Loc --   ?   Pain Edu? --   ?   Excl. in GC? --   ? ? ?Most recent vital signs: ?Vitals:  ? 09/07/21 1423 09/07/21 1545  ?BP: (!) 165/76 (!) 148/79  ?Pulse: 99 (!) 107  ?Resp: (!) 21 18  ?Temp:    ?SpO2: 96% 98%  ? ? ? ?General: Awake, alert but cannot answer any questions.  She looks uncomfortable ?Head normocephalic atraumatic eyes pupils equal round reactive ?CV:  Good peripheral perfusion.  Heart regular rate and rhythm no audible murmurs ?Resp:  Normal effort.  Lungs sound clear ?Abd:  No distention.  Abdomen appears to be somewhat tender ?Extremities moving all extremities equally and well no edema ? ? ?ED Results / Procedures / Treatments   ? ?Labs ?(all labs ordered are listed, but only abnormal results are displayed) ?Labs Reviewed  ?COMPREHENSIVE METABOLIC PANEL - Abnormal; Notable for the following components:  ?    Result Value  ? Potassium 2.7 (*)   ? Glucose, Bld 117 (*)   ? BUN 24 (*)   ? Albumin 3.2 (*)   ? Alkaline Phosphatase 144 (*)   ? Anion gap 17 (*)   ? All other components within normal limits  ?CBC - Abnormal; Notable for the following components:  ? WBC 13.6 (*)   ? RBC 5.12 (*)   ? Hemoglobin 16.3 (*)   ? HCT 47.7 (*)   ? All other components within normal limits  ?URINALYSIS, ROUTINE W REFLEX MICROSCOPIC - Abnormal; Notable for the following components:  ? Color, Urine YELLOW (*)   ? APPearance HAZY (*)   ? Hgb urine dipstick MODERATE (*)   ? Ketones, ur 20 (*)   ? Protein, ur 30 (*)   ? Nitrite POSITIVE (*)   ?  Leukocytes,Ua LARGE (*)   ? WBC, UA >50 (*)   ? Bacteria, UA MANY (*)   ? All other components within normal limits  ?CBC WITH DIFFERENTIAL/PLATELET - Abnormal; Notable for the following components:  ? WBC 13.7 (*)   ? Hemoglobin 16.2 (*)   ? HCT 47.1 (*)   ? Neutro Abs 12.2 (*)   ? All other components within normal limits  ?SEDIMENTATION RATE - Abnormal; Notable for the following components:  ? Sed Rate 44 (*)   ? All other components within normal limits  ?TROPONIN I (HIGH SENSITIVITY) - Abnormal; Notable for the following components:  ? Troponin I (High Sensitivity) 45 (*)   ? All other components within normal limits  ?TROPONIN I (HIGH SENSITIVITY) - Abnormal; Notable for the following components:  ? Troponin I (High Sensitivity) 48 (*)   ? All other components within normal limits  ?RESP PANEL BY RT-PCR (FLU A&B, COVID) ARPGX2  ?C DIFFICILE QUICK SCREEN W PCR REFLEX    ?GASTROINTESTINAL PANEL BY PCR, STOOL (REPLACES STOOL CULTURE)  ?CULTURE, BLOOD (SINGLE)  ?LIPASE, BLOOD  ?MAGNESIUM  ?LACTIC ACID, PLASMA  ?DIFFERENTIAL  ? ? ? ?EKG ? ? ? ? ?RADIOLOGY ?Chest x-ray shows possible pneumonia reviewed the film ?CT of the  head shows no acute disease I reviewed those films ?CT of the abdomen and pelvis shows an abdominal aortic aneurysm colitis and thickening of the wall of the gallbladder.  I reviewed those films ultrasound shows what appears to be gallbladder hydrops per radiology I reviewed those films as well. ? ? ? ? ? ?PROCEDURES: ? ?Critical Care performed: Critical care time 30 minutes.  This includes speaking to the patient's daughter reviewing the patient's findings ordering additional tests and reviewing those findings.  Additionally I looked at the patient's old records and old lab work ? ?Procedures ? ? ?MEDICATIONS ORDERED IN ED: ?Medications  ?lactated ringers 1,000 mL with potassium chloride 40 mEq infusion (has no administration in time range)  ?atorvastatin (LIPITOR) tablet 40 mg (has no administration in time range)  ?albuterol (PROVENTIL) (2.5 MG/3ML) 0.083% nebulizer solution 3 mL (has no administration in time range)  ?enoxaparin (LOVENOX) injection 40 mg (has no administration in time range)  ?ondansetron [facility]) tablet 4 mg ( Oral See Alternative 09/07/21 1516)  ?  Or  ?ondansetron [facility]) injection 4 mg (4 mg Intravenous Given 09/07/21 1516)  ?cefTRIAXone (ROCEPHIN) 2 g in sodium chloride 0.9 % 100 mL IVPB (has no administration in time range)  ?metroNIDAZOLE (FLAGYL) IVPB 500 mg (has no administration in time range)  ?pantoprazole (PROTONIX) injection 40 mg (has no administration in time range)  ?potassium chloride 10 mEq in 100 mL IVPB (0 mEq Intravenous Stopped 09/07/21 1344)  ?lactated ringers bolus 1,000 mL (0 mLs Intravenous Stopped 09/07/21 1421)  ?cefTRIAXone (ROCEPHIN) 1 g in sodium chloride 0.9 % 100 mL IVPB (0 g Intravenous Stopped 09/07/21 1320)  ?azithromycin (ZITHROMAX) 500 mg in sodium chloride 0.9 % 250 mL IVPB (0 mg Intravenous Stopped 09/07/21 1424)  ?iohexol (OMNIPAQUE) 300 MG/ML solution 80 mL (80 mLs Intravenous Contrast Given 09/07/21 1239)  ?metroNIDAZOLE (FLAGYL) IVPB 500 mg (0 mg Intravenous  Stopped 09/07/21 1545)  ? ? ? ?IMPRESSION / MDM / ASSESSMENT AND PLAN / ED COURSE  ?I reviewed the triage vital signs and the nursing notes. ? 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  She has marked dehydration with hyperkalemia hypernatremia and AKI.  She has also other electrolyte abnormalities related to this.  Her troponin is  mildly elevated but does not change significantly.  She has a white count with 90% polys.  She does have a UTI which may explain the white count and the elevated sed rate along with the CT findings which I will discuss in a second CT of the head  does not show any acute pathology.  CT of the abdomen shows dilated gallbladder with thickened wall and what appears to be colitis.  Ultrasound was done to look at the gallbladder which shows only hydrops of the gallbladder and some ductal dilatation which is mild without any evidence of choledocholithiasis. ? ? ? ?The patient is on the cardiac monitor to evaluate for evidence of arrhythmia and/or significant heart rate changes.  None were seen ? ?  ? ? ?FINAL CLINICAL IMPRESSION(S) / ED DIAGNOSES  ? ?Final diagnoses:  ?Dehydration  ?Nausea vomiting and diarrhea  ?Colitis  ?Urinary tract infection with hematuria, site unspecified  ?Altered mental status, unspecified altered mental status type  ?Gallbladder hydrops  ? ? ? ?Rx / DC Orders  ? ?ED Discharge Orders   ? ? None  ? ?  ? ? ? ?Note:  This document was prepared using Dragon voice recognition software and may include unintentional dictation errors. ?  ?Arnaldo Natal, MD ?09/07/21 1613 ? ?

## 2021-09-07 NOTE — Consult Note (Addendum)
Subjective:   CC: Gallbladder hydrops  HPI:  Joy Clark is a 71 y.o. female who is consulted by [facility] for evaluation of above cc.  Per chart review, pt experienced some diarrhea and vomiting for several days, then noted to have AMS.  No report of abdominal pain through chart review.  Patient currently continues to have altered mental status and is unable to answer any past questions.  She cannot definitively answer yes or no to any abdominal pain, but does nod her head yes when she is asked if she is nauseous.      Past Medical History:  has a past medical history of Fibromyalgia.  Past Surgical History: none reported  Family History: reviewed and not relevant to CC  Social History:  reports that she has been smoking cigarettes. She does not have any smokeless tobacco history on file. No history on file for alcohol use and drug use.  Current Medications:  Prior to Admission medications   Medication Sig Start Date End Date Taking? Authorizing Provider  albuterol (VENTOLIN HFA) 108 (90 Base) MCG/ACT inhaler Inhale into the lungs every 6 (six) hours as needed for wheezing or shortness of breath.   Yes [provider]  atorvastatin (LIPITOR) 40 MG tablet Take 40 mg by mouth daily.   Yes [provider]  carboxymethylcellulose 1 % ophthalmic solution 1 drop 3 (three) times daily.   Yes [provider]  celecoxib (CELEBREX) 200 MG capsule Take 200 mg by mouth 2 (two) times daily.   Yes [provider]  cilostazol (PLETAL) 100 MG tablet Take 100 mg by mouth 2 (two) times daily.   Yes [provider]  fluticasone (FLONASE) 50 MCG/ACT nasal spray Place into both nostrils daily.   Yes [provider]  gabapentin (NEURONTIN) 800 MG tablet Take 800 mg by mouth 3 (three) times daily.   Yes [provider]  omeprazole (PRILOSEC) 40 MG capsule Take 40 mg by mouth daily.   Yes [provider]  ondansetron (ZOFRAN) 8 MG tablet Take  by mouth every 8 (eight) hours as needed for nausea or vomiting.   Yes [provider]  oxyCODONE-acetaminophen (PERCOCET) 10-325 MG tablet Take 1 tablet by mouth every 4 (four) hours as needed for pain.   Yes [provider]  sodium chloride (OCEAN) 0.65 % SOLN nasal spray Place 1 spray into both nostrils as needed for congestion.   Yes [provider]    Allergies:  Allergies as of 09/07/2021   (Not on File)    ROS:  Unable to be obtained secondary to pt mentation     Objective:     BP (!) 148/79    Pulse (!) 107    Temp 98.3 F (36.8 C) (Axillary)    Resp 18    Ht 5' 10"  (1.778 m)    Wt 63.5 kg    SpO2 98%    BMI 20.09 kg/m    Constitutional :  no distress, slowed mentation, and uncooperative  Lymphatics/Throat:  no asymmetry, masses, or scars  Respiratory:  clear to auscultation bilaterally  Cardiovascular:  regular rate and rhythm  Gastrointestinal: Soft, no guarding, some grimace with palpation in lower quadrants, no sign of TTP in upper quadrant .   Musculoskeletal: Steady movement  Skin: Cool and moist          LABS:  CMP Latest Ref Rng & Units 09/07/2021  Glucose 70 - 99 mg/dL 117(H)  BUN 8 - 23 mg/dL 24(H)  Creatinine 0.44 - 1.00 mg/dL 0.92  Sodium 135 - 145 mmol/L 144  Potassium 3.5 - 5.1 mmol/L 2.7(LL)  Chloride 98 - 111 mmol/L 104  CO2 22 - 32 mmol/L 23  Calcium 8.9 - 10.3 mg/dL 9.4  Total Protein 6.5 - 8.1 g/dL 7.6  Total Bilirubin 0.3 - 1.2 mg/dL 0.5  Alkaline Phos 38 - 126 U/L 144(H)  AST 15 - 41 U/L 36  ALT 0 - 44 U/L 25   CBC Latest Ref Rng & Units 09/07/2021 09/07/2021  WBC 4.0 - 10.5 K/uL 13.7(H) 13.6(H)  Hemoglobin 12.0 - 15.0 g/dL 16.2(H) 16.3(H)  Hematocrit 36.0 - 46.0 % 47.1(H) 47.7(H)  Platelets 150 - 400 K/uL 263 259     RADS: CLINICAL DATA:  Right upper quadrant abdominal pain.   EXAM: ULTRASOUND ABDOMEN LIMITED RIGHT UPPER QUADRANT   COMPARISON:  Abdominopelvic CT same date.   FINDINGS: Gallbladder:    Borderline gallbladder wall thickening to 3-4 mm. The gallbladder is mildly distended, measuring up to 8.7 cm in length. No evidence of cholelithiasis, pericholecystic fluid or sonographic Murphy sign.   Common bile duct:   Diameter: 8 mm. The common bile duct tapers distally. No evidence of choledocholithiasis or intrahepatic biliary dilatation.   Liver:   No focal lesion identified. Within normal limits in parenchymal echogenicity. Portal vein is patent on color Doppler imaging with normal direction of blood flow towards the liver.   Other: None.   IMPRESSION: 1. Nonspecific borderline gallbladder hydrops with borderline gallbladder wall thickening. No evidence of cholelithiasis or sonographic Murphy sign. 2. Mild extrahepatic biliary dilatation without evidence of choledocholithiasis.     Electronically Signed   By: Richardean Sale M.D.   On: 09/07/2021 14:07  CLINICAL DATA:  Abdominal pain   EXAM: CT ABDOMEN AND PELVIS WITH CONTRAST   TECHNIQUE: Multidetector CT imaging of the abdomen and pelvis was performed using the standard protocol following bolus administration of intravenous contrast.   RADIATION DOSE REDUCTION: This exam was performed according to the departmental dose-optimization program which includes automated exposure control, adjustment of the mA and/or kV according to patient size and/or use of iterative reconstruction technique.   CONTRAST:  45m OMNIPAQUE IOHEXOL 300 MG/ML  SOLN   COMPARISON:  None.   FINDINGS: Lower chest: Increased interstitial markings in the periphery of both lower lung fields may suggest scarring.   Hepatobiliary: No focal abnormality is seen in the liver. Gallbladder is distended. There is mild diffuse wall thickening in the gallbladder. There is no fluid around the gallbladder. Distal common bile duct in the head of the pancreas measures 8 mm in diameter.   Pancreas: There is atrophy.  No focal abnormality is  seen.   Spleen: Unremarkable.   Adrenals/Urinary Tract: Adrenals are unremarkable. There is no hydronephrosis. There are scattered calcifications in the right renal artery branches. No definite renal or ureteral stones are seen. Urinary bladder is not distended. There is mild diffuse wall thickening in the bladder which may be due to incomplete distention or cystitis.   Stomach/Bowel: Stomach is unremarkable. There is mild dilation of proximal small bowel loops. There is mild diffuse wall thickening in the distal small bowel loops. Appendix is not dilated. There is diffuse wall thickening in the colon. Liquid stool is seen in the the lumen of rectosigmoid.   Vascular/Lymphatic: There are scattered atherosclerotic plaques and calcifications. There is ectasia of infrarenal abdominal aorta measuring 3.4 x 3.3 cm. There is no demonstrable retroperitoneal hematoma. There are coarse calcifications in  the major branches of aorta. There is possible high-grade stenosis in right common iliac artery   Reproductive: Uterus is not seen.   Other: There is no pneumoperitoneum. Minimal amount of free fluid is seen in the pelvis, possibly due to inflammation of distal small bowel loops and colon.   Musculoskeletal: Degenerative changes are noted in the lumbar spine with disc space narrowing, bony spurs, facet hypertrophy and encroachment of neural foramina at multiple levels, more so at L3-L4 L4-L5 and L5-S1 levels.   IMPRESSION: There is diffuse wall thickening in colon. There is diffuse wall thickening in the distal small bowel loops. Findings suggest inflammatory or infectious enterocolitis.   There is no evidence of intestinal obstruction or pneumoperitoneum. There is no hydronephrosis.   Gallbladder is distended with wall thickening. This may be due to chronic cholecystitis. If there are focal symptoms in the right upper quadrant, gallbladder sonogram may be considered.   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   Other findings as described in the body of the report.     Electronically Signed   By: Elmer Picker M.D.   On: 09/07/2021 12:56 Assessment:      Gallbladder hydrops based on US findings with elevated alk phos  Plan:  No clinical exam concerning for acute cholecystitis associated with the distended gallbladder at this time.  History and remaining labs and imaging more concerning for UTI related sepsis and/or gastroenteritis.  Recommend continuing supportive care with IVF, abx per hospitalist team.  Ok to resume clears for now.  Surgery will continue to follow.  labs/images/medications/previous chart entries reviewed personally and relevant changes/updates noted above.

## 2021-09-07 NOTE — ED Notes (Signed)
US at bedside

## 2021-09-07 NOTE — Assessment & Plan Note (Signed)
Patient has a history of C. Difficile She has diarrhea but has not had recent antibiotic therapy We will send stool for C. difficile toxin Treat empirically with Rocephin and Flagyl

## 2021-09-07 NOTE — ED Notes (Signed)
Critical Result: Potassium 2.7 ? ?Darnelle Catalan, MD aware ?

## 2021-09-07 NOTE — ED Notes (Signed)
Patient to CT at this time

## 2021-09-07 NOTE — ED Notes (Signed)
Pt on the way to [facility] room 204 at this time, inpatient RN notified and pt's daughter at bedside. Pt clean and dry at time of ED departure  ?

## 2021-09-07 NOTE — Assessment & Plan Note (Signed)
Patient has significant pyuria We will treat empirically with Rocephin until urine culture results become available

## 2021-09-07 NOTE — Assessment & Plan Note (Signed)
Secondary to GI losses from nausea, vomiting and diarrhea Supplement potassium Check magnesium levels

## 2021-09-07 NOTE — Assessment & Plan Note (Addendum)
Patient presents for evaluation of mental status changes over the last 4 days prior to her admission. She is awake and is only oriented to person not place or time. Altered mental status appears to be metabolic and may be secondary to underlying UTI Expect improvement in patient's mental status following resolution of acute infection and treatment of acute illness Patient may need an MRI of the brain if her mental status does not improve.

## 2021-09-07 NOTE — ED Triage Notes (Addendum)
Pt comes ems from home with n/v/d. Hx of cdiff. Symptoms since Friday. Altered since then as well, Aox0. Normally Aox4, ambulatory. NSR. 166/73. CBG 127. 98.8 ax temp. 99% RA.  ? ?Unable to assess medical hx or allergies due to AMS. Daughter en route.  ?

## 2021-09-08 ENCOUNTER — Inpatient Hospital Stay: Payer: Medicare Other

## 2021-09-08 DIAGNOSIS — R1011 Right upper quadrant pain: Secondary | ICD-10-CM

## 2021-09-08 DIAGNOSIS — E0590 Thyrotoxicosis, unspecified without thyrotoxic crisis or storm: Secondary | ICD-10-CM

## 2021-09-08 DIAGNOSIS — I4891 Unspecified atrial fibrillation: Secondary | ICD-10-CM | POA: Diagnosis not present

## 2021-09-08 DIAGNOSIS — R932 Abnormal findings on diagnostic imaging of liver and biliary tract: Secondary | ICD-10-CM | POA: Diagnosis not present

## 2021-09-08 DIAGNOSIS — K529 Noninfective gastroenteritis and colitis, unspecified: Secondary | ICD-10-CM

## 2021-09-08 DIAGNOSIS — R112 Nausea with vomiting, unspecified: Secondary | ICD-10-CM

## 2021-09-08 DIAGNOSIS — K821 Hydrops of gallbladder: Secondary | ICD-10-CM

## 2021-09-08 DIAGNOSIS — R4182 Altered mental status, unspecified: Secondary | ICD-10-CM | POA: Diagnosis not present

## 2021-09-08 DIAGNOSIS — E876 Hypokalemia: Secondary | ICD-10-CM | POA: Diagnosis not present

## 2021-09-08 DIAGNOSIS — E860 Dehydration: Secondary | ICD-10-CM | POA: Diagnosis not present

## 2021-09-08 DIAGNOSIS — N390 Urinary tract infection, site not specified: Secondary | ICD-10-CM

## 2021-09-08 DIAGNOSIS — R319 Hematuria, unspecified: Secondary | ICD-10-CM

## 2021-09-08 DIAGNOSIS — G9341 Metabolic encephalopathy: Secondary | ICD-10-CM | POA: Diagnosis not present

## 2021-09-08 DIAGNOSIS — R197 Diarrhea, unspecified: Secondary | ICD-10-CM

## 2021-09-08 LAB — CBC
HCT: 43.6 % (ref 36.0–46.0)
Hemoglobin: 14.8 g/dL (ref 12.0–15.0)
MCH: 31.6 pg (ref 26.0–34.0)
MCHC: 33.9 g/dL (ref 30.0–36.0)
MCV: 93.2 fL (ref 80.0–100.0)
Platelets: 264 10*3/uL (ref 150–400)
RBC: 4.68 MIL/uL (ref 3.87–5.11)
RDW: 12.6 % (ref 11.5–15.5)
WBC: 13.4 10*3/uL — ABNORMAL HIGH (ref 4.0–10.5)
nRBC: 0 % (ref 0.0–0.2)

## 2021-09-08 LAB — T4, FREE: Free T4: 2.24 ng/dL — ABNORMAL HIGH (ref 0.61–1.12)

## 2021-09-08 LAB — BASIC METABOLIC PANEL
Anion gap: 16 — ABNORMAL HIGH (ref 5–15)
BUN: 25 mg/dL — ABNORMAL HIGH (ref 8–23)
CO2: 22 mmol/L (ref 22–32)
Calcium: 8.5 mg/dL — ABNORMAL LOW (ref 8.9–10.3)
Chloride: 111 mmol/L (ref 98–111)
Creatinine, Ser: 0.89 mg/dL (ref 0.44–1.00)
GFR, Estimated: >60 mL/min (ref >60)
Glucose, Bld: 102 mg/dL — ABNORMAL HIGH (ref 70–99)
Potassium: 2.6 mmol/L — CL (ref 3.5–5.1)
Sodium: 149 mmol/L — ABNORMAL HIGH (ref 135–145)

## 2021-09-08 LAB — MAGNESIUM: Magnesium: 1.9 mg/dL (ref 1.7–2.4)

## 2021-09-08 LAB — LACTIC ACID, PLASMA
Lactic Acid, Venous: 1 mmol/L (ref 0.5–1.9)
Lactic Acid, Venous: 0.9 mmol/L (ref 0.5–1.9)

## 2021-09-08 LAB — HEPARIN LEVEL (UNFRACTIONATED): Heparin Unfractionated: 0.29 IU/mL — ABNORMAL LOW (ref 0.30–0.70)

## 2021-09-08 LAB — PROTIME-INR
INR: 1.3 — ABNORMAL HIGH (ref 0.8–1.2)
Prothrombin Time: 16.4 seconds — ABNORMAL HIGH (ref 11.4–15.2)

## 2021-09-08 LAB — GLUCOSE, CAPILLARY: Glucose-Capillary: 108 mg/dL — ABNORMAL HIGH (ref 70–99)

## 2021-09-08 LAB — APTT: aPTT: 77 seconds — ABNORMAL HIGH (ref 24–36)

## 2021-09-08 LAB — TSH: TSH: 0.01 u[IU]/mL — ABNORMAL LOW (ref 0.350–4.500)

## 2021-09-08 LAB — PROCALCITONIN: Procalcitonin: 0.53 ng/mL

## 2021-09-08 LAB — D-DIMER, QUANTITATIVE: D-Dimer, Quant: 6.42 ug/mL-FEU — ABNORMAL HIGH (ref 0.00–0.50)

## 2021-09-08 LAB — POTASSIUM: Potassium: 3.4 mmol/L — ABNORMAL LOW (ref 3.5–5.1)

## 2021-09-08 IMAGING — DX DG CHEST 1V PORT
1 series · 1 of 1 positions shown · non-contrast
Comparison: [DATE]

CLINICAL DATA: Chest pain

EXAM:
PORTABLE CHEST 1 VIEW

[chest ap]
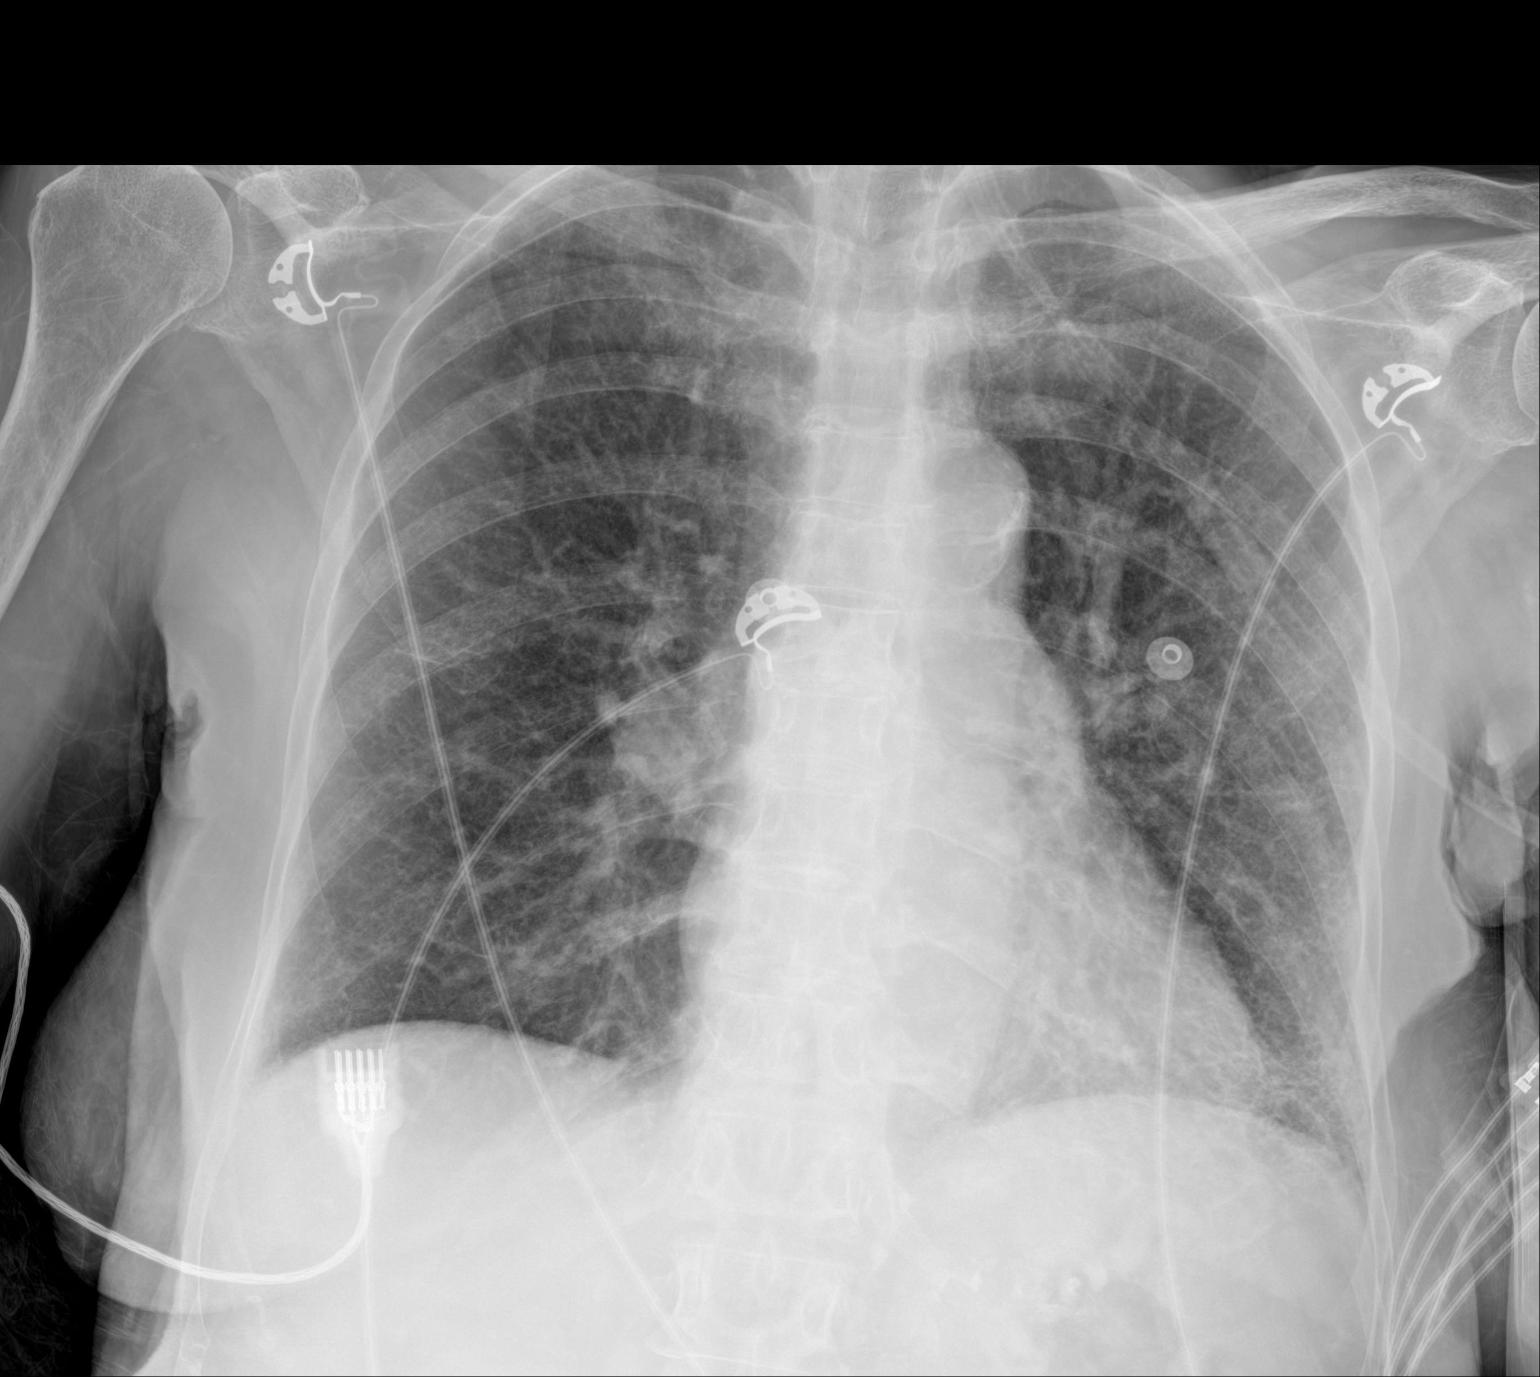

[1 of 1 positions shown; findings below may reference images not displayed]

FINDINGS: Heart is normal size. Aortic atherosclerosis. Interstitial
coarsening throughout the lungs again noted, unchanged. Continued
opacity at the left base could reflect chronic interstitial changes
or developing infiltrate. No effusions. No real change since prior
study.
IMPRESSION: Stable chronic interstitial coarsening.

Slight increased density at the left base could also be chronic
interstitial changes although early infiltrate cannot be excluded.

## 2021-09-08 IMAGING — US US THYROID
1 series · 14 of 25 positions shown · non-contrast
Comparison: None.

CLINICAL DATA: Hyperthyroidism.

EXAM:
THYROID ULTRASOUND
TECHNIQUE: Ultrasound examination of the thyroid gland and adjacent soft
tissues was performed.

[Series 1: us thyroid · 14 of 48 slices shown]
[im 1/48]
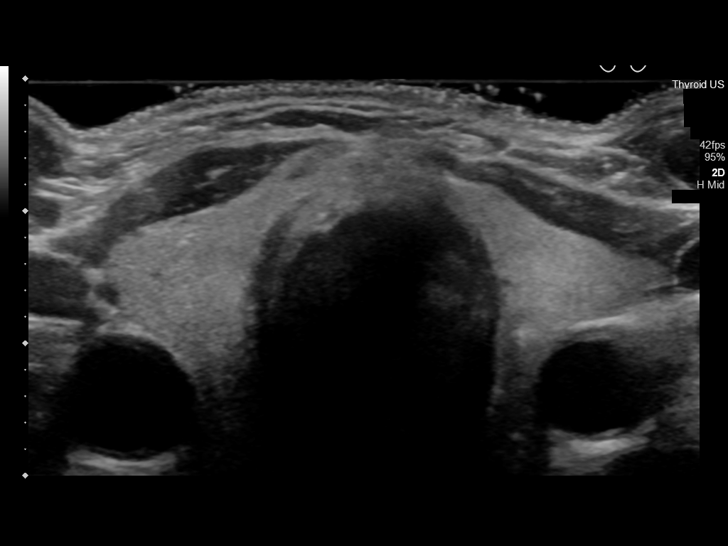
[im 4/48]
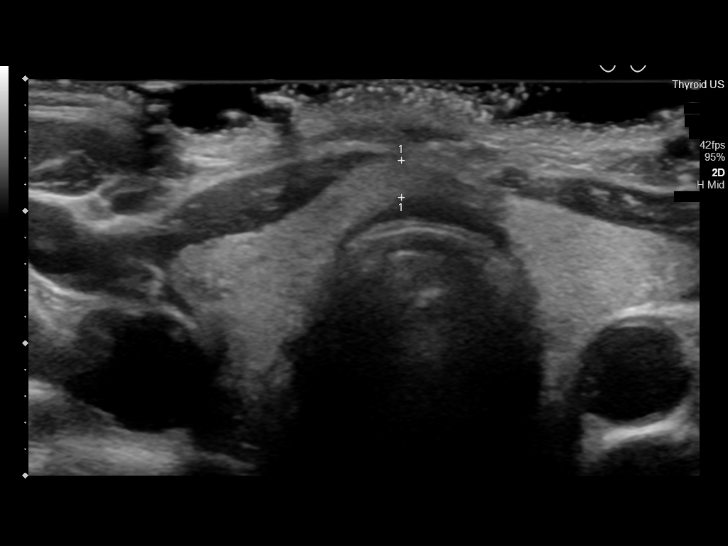
[im 8/48]
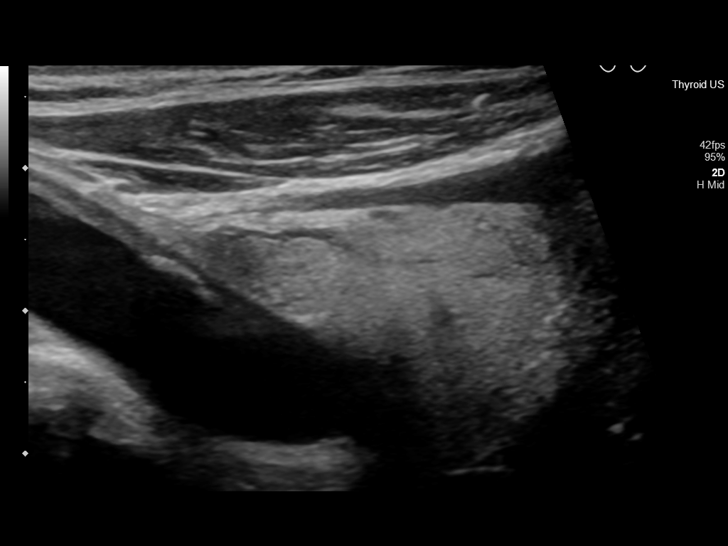
[im 12/48]
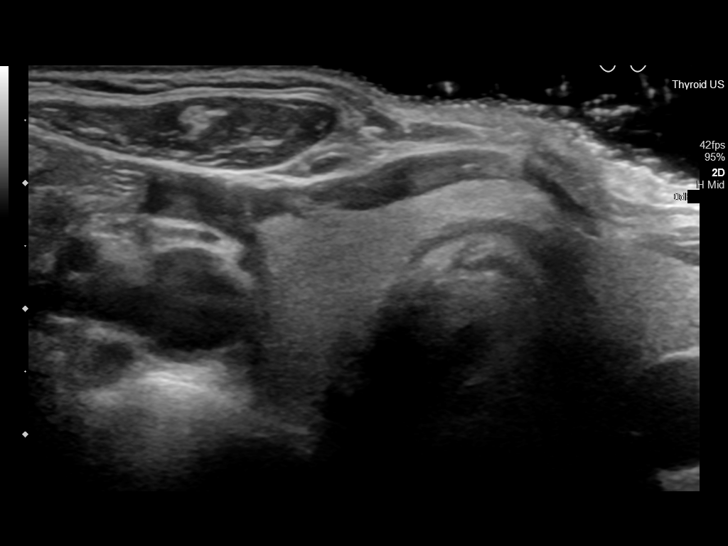
[im 16/48]
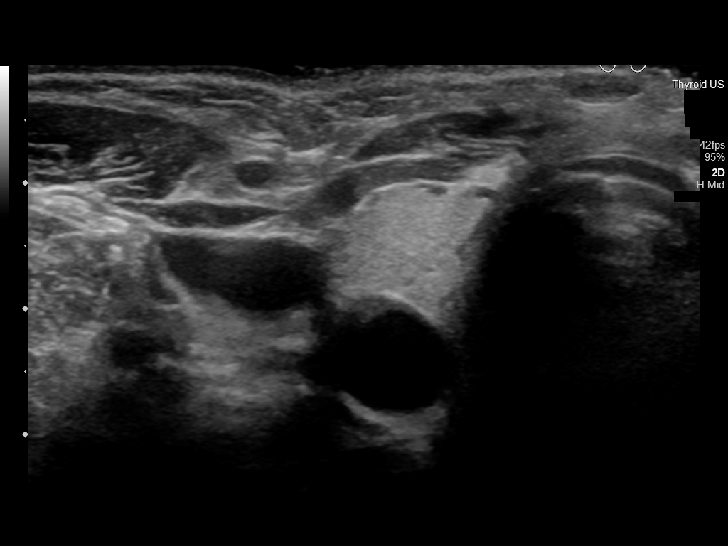
[im 18/48]
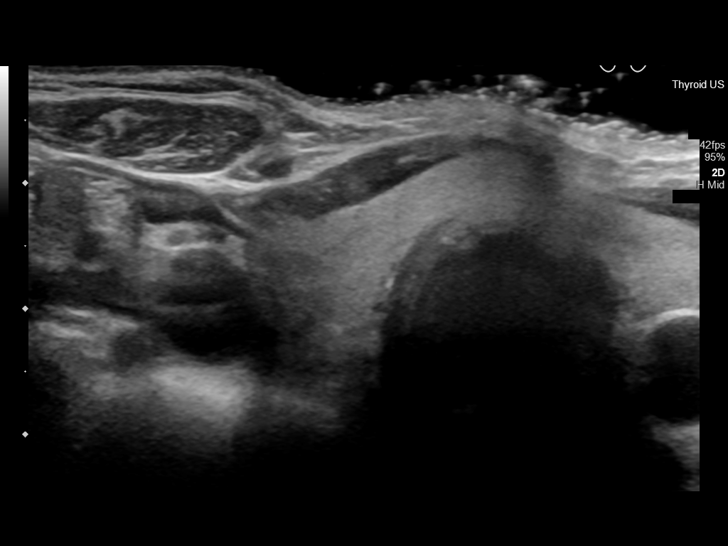
[im 22/48]
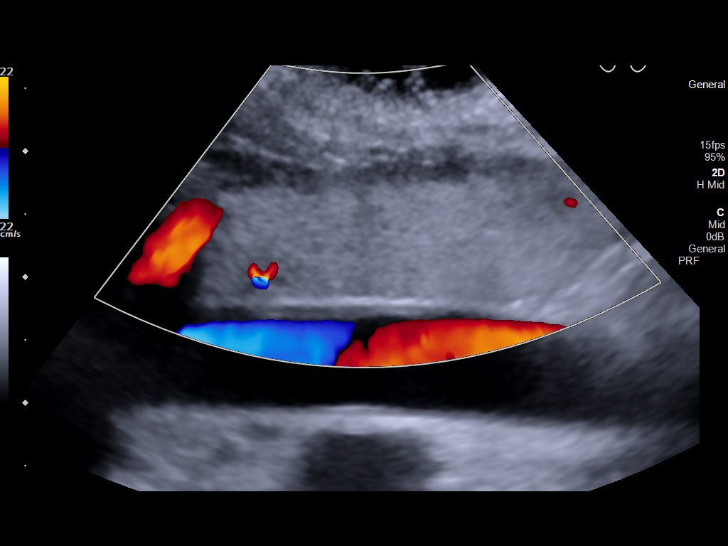
[im 26/48]
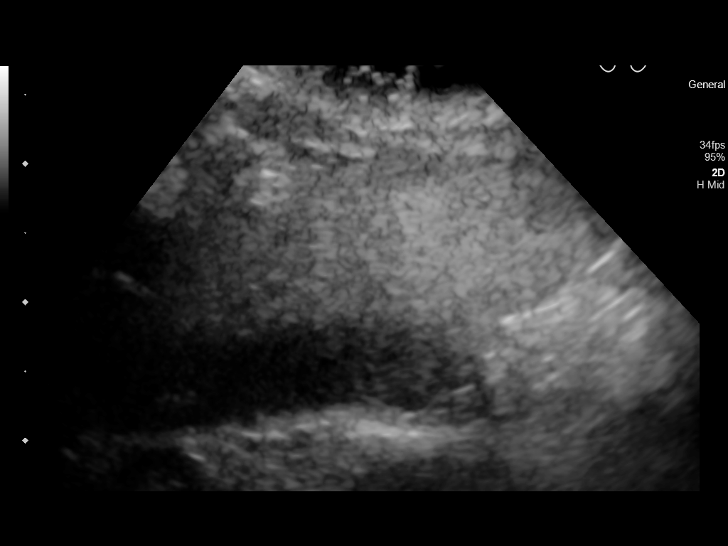
[im 30/48]
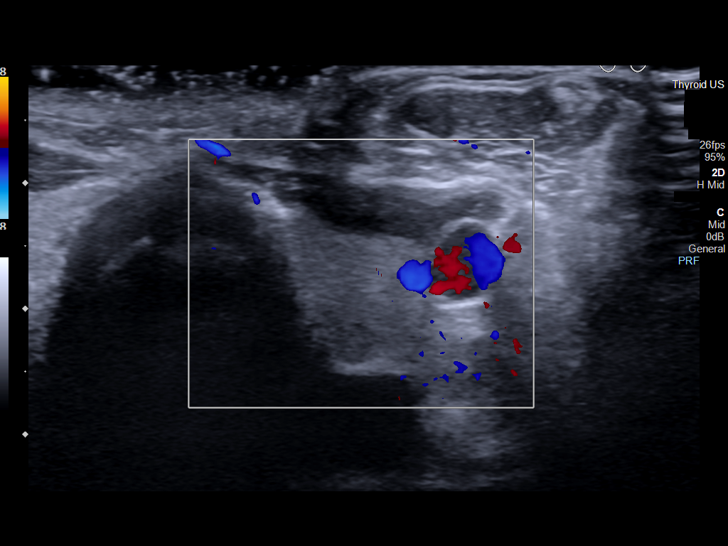
[im 32/48]
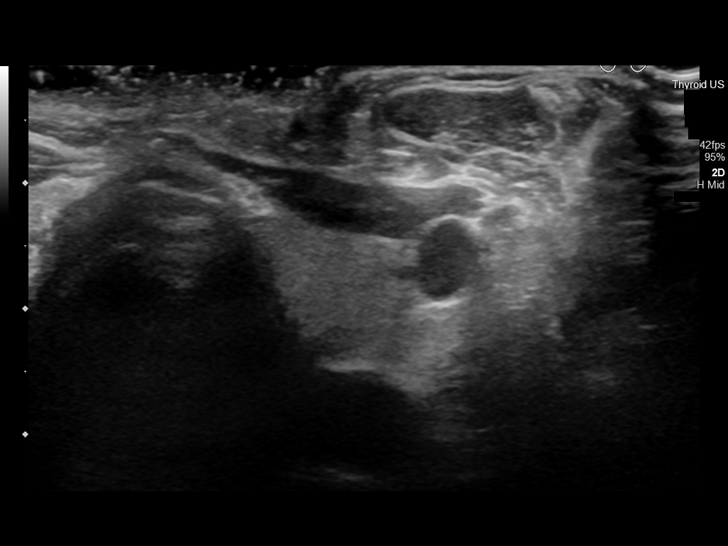
[im 36/48]
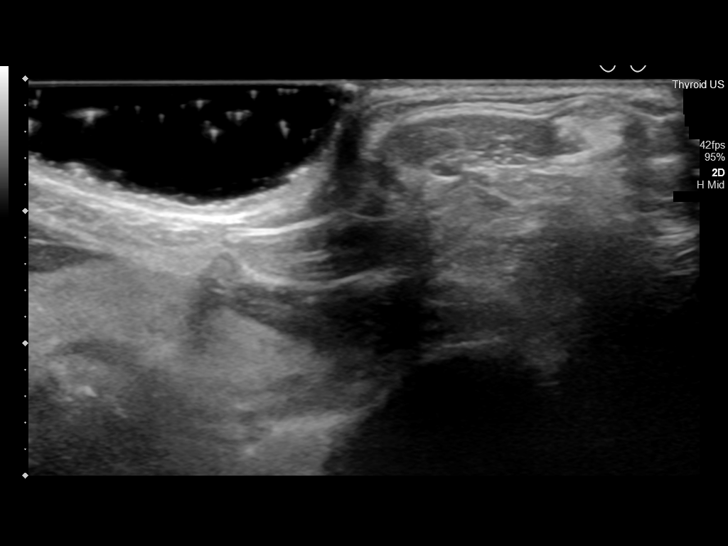
[im 40/48]
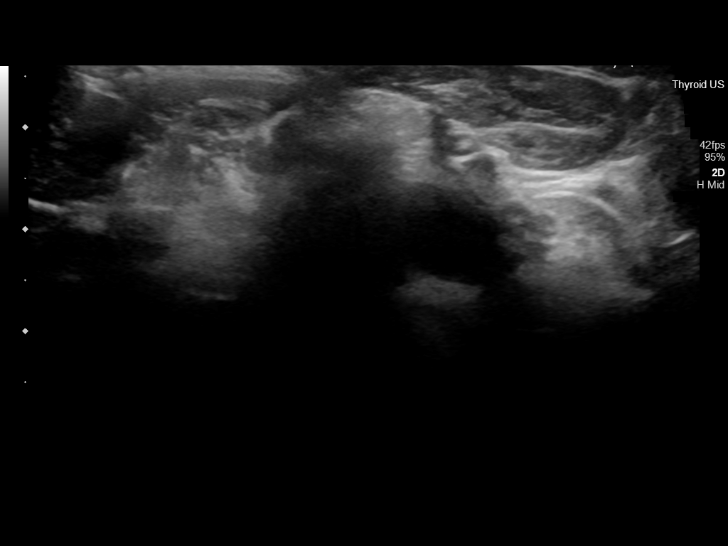
[im 44/48]
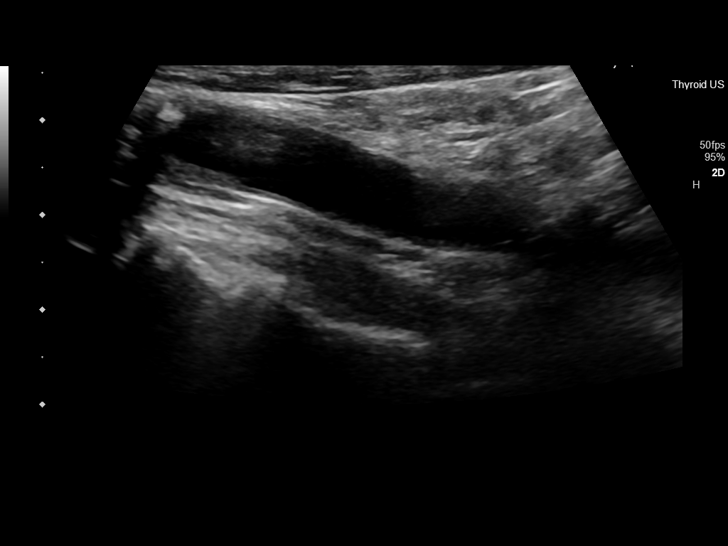
[im 48/48]
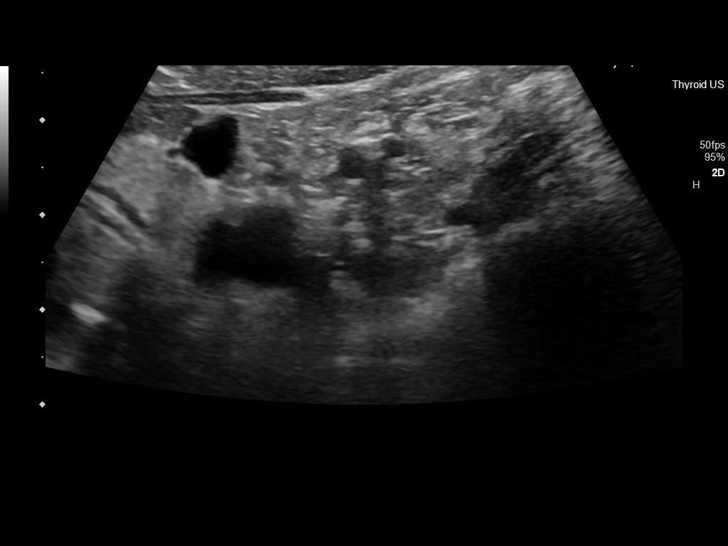

[14 of 25 positions shown; findings below may reference images not displayed]

FINDINGS: Parenchymal Echotexture: Mildly heterogenous

Isthmus: 0.3 cm

Right lobe: 3.7 x 1.3 x 1.3 cm

Left lobe: 3.6 x 1.1 x 1.2 cm

_________________________________________________________

Estimated total number of nodules >/= 1 cm: 0

Number of spongiform nodules >/=  2 cm not described below (TR1): 0

Number of mixed cystic and solid nodules >/= 1.5 cm not described
below (TR2): 0

_________________________________________________________

Few small hypoechoic structures in the right thyroid lobe are
nonspecific. No significant or suspicious thyroid nodules.

Small amount of heterogeneous plaque in the right carotid artery.
Evidence for echogenic plaque in left carotid artery.
IMPRESSION: 1. Thyroid tissue is mildly heterogeneous. No suspicious thyroid
nodules. Reportedly, this was a technically challenging examination
due to patient altered mental status.
2. Carotid artery atherosclerotic disease.

The above is in keeping with the ACR TI-RADS recommendations - [HOSPITAL] [LH];[DATE].

## 2021-09-08 MED ORDER — DILTIAZEM HCL-DEXTROSE 125-5 MG/125ML-% IV SOLN (PREMIX)
5.0000 mg/h | INTRAVENOUS | Status: DC
  Administered 2021-09-08: 7.5 mg/h via INTRAVENOUS
  Administered 2021-09-08: 5 mg/h via INTRAVENOUS
  Administered 2021-09-09: 7.5 mg/h via INTRAVENOUS
  Filled 2021-09-08: qty 125

## 2021-09-08 MED ORDER — POTASSIUM CHLORIDE CRYS ER 20 MEQ PO TBCR
40.0000 meq | EXTENDED_RELEASE_TABLET | Freq: Once | ORAL | Status: AC
  Administered 2021-09-08: 40 meq via ORAL
  Filled 2021-09-08: qty 2

## 2021-09-08 MED ORDER — DILTIAZEM HCL 30 MG PO TABS
60.0000 mg | ORAL_TABLET | Freq: Four times a day (QID) | ORAL | Status: DC
  Administered 2021-09-08: 60 mg via ORAL
  Filled 2021-09-08: qty 2

## 2021-09-08 MED ORDER — PROCHLORPERAZINE EDISYLATE 10 MG/2ML IJ SOLN
INTRAMUSCULAR | Status: AC
  Filled 2021-09-08: qty 2

## 2021-09-08 MED ORDER — DILTIAZEM HCL-DEXTROSE 125-5 MG/125ML-% IV SOLN (PREMIX)
5.0000 mg/h | INTRAVENOUS | Status: DC
  Filled 2021-09-08: qty 125

## 2021-09-08 MED ORDER — HEPARIN (PORCINE) 25000 UT/250ML-% IV SOLN
1000.0000 [IU]/h | INTRAVENOUS | Status: DC
  Administered 2021-09-08: 850 [IU]/h via INTRAVENOUS
  Administered 2021-09-09: 1000 [IU]/h via INTRAVENOUS
  Filled 2021-09-08 (×3): qty 250

## 2021-09-08 MED ORDER — METOPROLOL TARTRATE 5 MG/5ML IV SOLN
5.0000 mg | INTRAVENOUS | Status: AC
  Administered 2021-09-08: 5 mg via INTRAVENOUS
  Filled 2021-09-08: qty 5

## 2021-09-08 MED ORDER — DILTIAZEM HCL 25 MG/5ML IV SOLN
15.0000 mg | Freq: Once | INTRAVENOUS | Status: AC
  Administered 2021-09-08: 15 mg via INTRAVENOUS
  Filled 2021-09-08: qty 5

## 2021-09-08 MED ORDER — HEPARIN BOLUS VIA INFUSION
3100.0000 [IU] | Freq: Once | INTRAVENOUS | Status: DC
  Filled 2021-09-08: qty 3100

## 2021-09-08 MED ORDER — PROCHLORPERAZINE EDISYLATE 10 MG/2ML IJ SOLN
10.0000 mg | INTRAMUSCULAR | Status: AC
  Administered 2021-09-08: 10 mg via INTRAVENOUS

## 2021-09-08 MED ORDER — HEPARIN BOLUS VIA INFUSION
1500.0000 [IU] | Freq: Once | INTRAVENOUS | Status: AC
  Administered 2021-09-08: 1500 [IU] via INTRAVENOUS
  Filled 2021-09-08: qty 1500

## 2021-09-08 MED ORDER — POTASSIUM CHLORIDE 10 MEQ/100ML IV SOLN
10.0000 meq | INTRAVENOUS | Status: AC
  Administered 2021-09-08 (×4): 10 meq via INTRAVENOUS
  Filled 2021-09-08 (×4): qty 100

## 2021-09-08 NOTE — Progress Notes (Signed)
?   09/08/21 0241  ?Assess: MEWS Score  ?Temp 99 ?F (37.2 ?C)  ?BP (!) 136/118  ?Pulse Rate (!) 162  ?Resp (!) 34  ?SpO2 95 %  ?O2 Device Room Air  ?Assess: MEWS Score  ?MEWS Temp 0  ?MEWS Systolic 0  ?MEWS Pulse 3  ?MEWS RR 2  ?MEWS LOC 0  ?MEWS Score 5  ?MEWS Score Color Red  ?Assess: if the MEWS score is Yellow or Red  ?Were vital signs taken at a resting state? Yes  ?Focused Assessment No change from prior assessment  ?Does the patient meet 2 or more of the SIRS criteria? No  ?MEWS guidelines implemented *See Row Information* Yes  ?Treat  ?MEWS Interventions Escalated (See documentation below)  ?Pain Scale 0-10  ?Pain Score 0  ?Take Vital Signs  ?Increase Vital Sign Frequency  Red: Q 1hr X 4 then Q 4hr X 4, if remains red, continue Q 4hrs  ?Escalate  ?MEWS: Escalate Red: discuss with charge nurse/RN and provider, consider discussing with RRT  ?Notify: Charge Nurse/RN  ?Name of Charge Nurse/RN Notified Marcella, RN  ?Date Charge Nurse/RN Notified 09/08/21  ?Time Charge Nurse/RN Notified [phone]  ?Notify: Provider  ?Provider Name/Title Manuela Schwartz, NP  ?Date Provider Notified 09/08/21  ?Time Provider Notified [phone]  ?Notification Type Page ?(secure chat)  ?Notification Reason Other (Comment)  ?Provider response At bedside  ?Date of Provider Response 09/08/21  ?Time of Provider Response [phone]  ?Notify: Rapid Response  ?Name of Rapid Response RN Notified Beth Buono  ?Date Rapid Response Notified 09/08/21  ?Time Rapid Response Notified 0242  ?Document  ?Patient Outcome Stabilized after interventions  ?Progress note created (see row info) Yes  ?Assess: SIRS CRITERIA  ?SIRS Temperature  0  ?SIRS Pulse 1  ?SIRS Respirations  1  ?SIRS WBC 0  ?SIRS Score Sum  2  ? ?Received called form Tele that patient HR in the 160s, vital check, red mews, provider, RR, CN all notified. See order set. Plan is to transfer patient to PCU when bed is available. Patient currently stable. We continue to monitor.  ?

## 2021-09-08 NOTE — Progress Notes (Signed)
Patient transfer to 74, family notified. ?

## 2021-09-08 NOTE — Evaluation (Signed)
Clinical/Bedside Swallow Evaluation Patient Details  Name: Joy Clark MRN: [id number] Date of Birth: 12/24/1950  Today's Date: 09/08/2021 Time: SLP Start Time (ACUTE ONLY): 1310 SLP Stop Time (ACUTE ONLY): 1355 SLP Time Calculation (min) (ACUTE ONLY): 45 min  Past Medical History:  Past Medical History:  Diagnosis Date   Fibromyalgia    Past Surgical History: History reviewed. No pertinent surgical history. HPI:  Pt is a 71 y.o. female with medical history significant for nicotine dependence, chronic pain syndrome, dyslipidemia who was brought into the ER by EMS for evaluation of mental status changes for about 4 days.  Patient is visiting her daughter here in [place] and has been here for about 4 weeks.  At baseline, she is usually awake, alert and oriented to person place and time.  Patient's daughter states that her symptoms started about 4 days ago with chills and confusion.  Since then she has had multiple episodes of emesis and diarrhea and is now incontinent of stool which is loose and has lots of mucus in it.  Patient's oral intake has been very poor over the last 4 days as well.  She is awake and oriented to person in the ED.   CXR: Stable chronic interstitial coarsening.     Slight increased density at the left base could also be chronic  interstitial changes although early infiltrate cannot be excluded.  HEAD CT: Mild atrophy and white matter microvascular ischemic changes.  2. No acute intracranial abnormality.  CT of Abd.: There is diffuse wall thickening in colon. There is diffuse wall  thickening in the distal small bowel loops. Findings suggest  inflammatory or infectious enterocolitis.    Assessment / Plan / Recommendation  Clinical Impression  Pt appears to present w/ oral phase dysphagia in setting of Acute illness and Cognitive decline; unsure of pt's Baseline Cognitive status d/t lack of supporting history and/or chart information. Any Cognitive decline in the setting  of Acute illness can impact overall awareness/timing of swallow and safety during po tasks which increases risk for aspiration, choking. Pt also exhibited increased Phlegm leading to gagging and expectoration; this too can have impact on swallowing and oral intake in general.  Pt's risk for aspiration can be reduced when following general aspiration precautions. Verbal/tactile/visual cues and support during po tasks can help encourage and support oral intake. Pt required MOD+ assistance w/ initiation in taking po trials as well as w/ oral care. She was unable to manage clearing the Phlegm she expectorated and often let it hang from her mouth. Pt often labile at times. Again, unsure of pt's Baseline Cognitive status.  Upon entering room, pt was lying in bed; not overally verbal but nodded her head. She did not immediately respond to direct questions. She was labile at times. She required MAX support to sit fully upright in bed for po intake.   Pt consumed only few trials of ice chips, thin liquids via Straw, and purees. No immediate, overt clinical s/s of aspiration noted w/ 2 sips of thin liquids and ice chips(2) accepted; no decline in vocal quality; no cough, and no decline in respiratory status during/post trials. HOWEVER, oral phase deficits coupled w/ Cognitive decline resulted in pt orally holding boluses, "swishing" boluses orally (even applesauce), then expectorated the boluses -- minimal intake consumed w/ pharyngeal swallowing following. Pt was given MOD++ cues to complete a swallow and NOT "swish", but she was unable to follow through and spit out trials anyway. W/ some expectorated trials, also noted  gagging and increased, thick Phelgm. Pt nodded she had been having the Phlegm at home also. Pt attempted self-feeding by holding Cup to drink but required Mod support and guidance. OM Exam appeared [facility] during bolus management and oral clearing w/ No unilateral labial/lingual weakness noted. Some  confusion of OM tasks and oral care.     D/t pt's overall presentation and her risk for aspiration, recommend initiation of a more Full Liquid diet w/ general aspiration precautions; reduce Distractions during meals and engage pt during po's at meal for self-feeding. Support and encouragement w/ eating at meals as needed. Pills Crushed in Puree for safer swallowing.  Recommend f/u w/ GI if further increased Phlegm to r/o possible GERD, Esophageal dysmotility. MD/NSG updated. ST services will continue to f/u while pt is admitted.  SLP Visit Diagnosis: Dysphagia, oral phase (R13.11) (suspect impact from Cognitive decline and Acute illness)    Aspiration Risk  Mild aspiration risk;Risk for inadequate nutrition/hydration    Diet Recommendation   Full Liquid diet w/ general aspiration precautions; reduce Distractions during meals and engage pt during po's at meal for self-feeding. Support and encouragement w/ eating at meals as needed.   Medication Administration: Crushed with puree    Other  Recommendations Recommended Consults:  (Dietician f/u) Oral Care Recommendations: Oral care BID;Oral care before and after PO;Staff/trained caregiver to provide oral care Other Recommendations:  (n/a)    Recommendations for follow up therapy are one component of a multi-disciplinary discharge planning process, led by the attending physician.  Recommendations may be updated based on patient status, additional functional criteria and insurance authorization.  Follow up Recommendations Skilled nursing-short term rehab (<3 hours/day)      Assistance Recommended at Discharge Frequent or constant Supervision/Assistance  Functional Status Assessment Patient has had a recent decline in their functional status and demonstrates the ability to make significant improvements in function in a reasonable and predictable amount of time.  Frequency and Duration min 2x/week  2 weeks       Prognosis Prognosis for Safe Diet  Advancement: Fair Barriers to Reach Goals: Cognitive deficits;Time post onset;Severity of deficits;Behavior Barriers/Prognosis Comment: acuity of illness; Cognitive decline      Swallow Study   General Date of Onset: 09/07/21 HPI: Pt is a 71 y.o. female with medical history significant for nicotine dependence, chronic pain syndrome, dyslipidemia who was brought into the ER by EMS for evaluation of mental status changes for about 4 days.  Patient is visiting her daughter here in [place] and has been here for about 4 weeks.  At baseline, she is usually awake, alert and oriented to person place and time.  Patient's daughter states that her symptoms started about 4 days ago with chills and confusion.  Since then she has had multiple episodes of emesis and diarrhea and is now incontinent of stool which is loose and has lots of mucus in it.  Patient's oral intake has been very poor over the last 4 days as well.  She is awake and oriented to person in the ED.   CXR: Stable chronic interstitial coarsening.     Slight increased density at the left base could also be chronic  interstitial changes although early infiltrate cannot be excluded.  HEAD CT: Mild atrophy and white matter microvascular ischemic changes.  2. No acute intracranial abnormality.  CT of Abd.: There is diffuse wall thickening in colon. There is diffuse wall  thickening in the distal small bowel loops. Findings suggest  inflammatory or infectious  enterocolitis. Type of Study: Bedside Swallow Evaluation Previous Swallow Assessment: none noted Diet Prior to this Study: NPO Temperature Spikes Noted: No (wbc 13.4) Respiratory Status: Nasal cannula (2L) History of Recent Intubation: No Behavior/Cognition: Alert;Confused;Distractible;Requires cueing;Pleasant mood (mostly nonverbal; few words only; lability) Oral Cavity Assessment: Excessive secretions (thick Phlegm) Oral Care Completed by SLP: Yes Oral Cavity - Dentition: Poor  condition;Missing dentition Vision: Functional for self-feeding (helped to hold cup) Self-Feeding Abilities: Needs assist;Needs set up;Total assist Patient Positioning: Upright in bed (needed positioning support) Baseline Vocal Quality: Low vocal intensity (reduced effort) Volitional Cough: Cognitively unable to elicit Volitional Swallow: Unable to elicit    Oral/Motor/Sensory Function Overall Oral Motor/Sensory Function: Within functional limits (during bolus management and oral clearing)   Ice Chips Ice chips: Within functional limits Presentation: Spoon (fed; 2 trials) Other Comments: made a sour face during po trials in mouth   Thin Liquid Thin Liquid: Impaired (initial 2 trials - WFL; the remainder = oral confusion) Presentation: Straw;Self Fed (supported; 6 trials attempted) Oral Phase Impairments: Poor awareness of bolus Oral Phase Functional Implications:  (expectorated 4/6 trials after swishing them first) Pharyngeal  Phase Impairments:  (none noted) Other Comments: oral phase deficits    Nectar Thick Nectar Thick Liquid: Not tested   Honey Thick Honey Thick Liquid: Not tested   Puree Puree: Impaired Presentation: Spoon (fed; 3 trials attempted) Oral Phase Impairments: Poor awareness of bolus Oral Phase Functional Implications:  (pt expectorated the trials after swishing them) Pharyngeal Phase Impairments:  (boluses were not swallowed)   Solid     Solid: Not tested        Jerilynn Som, MS, CCC-SLP Speech Language Pathologist Rehab Services; [facility] [place] [phone] (ascom) Maxcine Strong 09/08/2021,2:20 PM

## 2021-09-08 NOTE — Progress Notes (Signed)
PROGRESS NOTE  Joy Clark    DOB: July 30, 1950, 71 y.o.  [id number]    Code Status: Full Code   DOA: 09/07/2021   LOS: 1   Brief hospital course  Joy Clark is a 71 y.o. female with a PMH significant for nicotine dependence, chronic pain syndrome, HLD. They presented from home to the ED on 09/07/2021 with AMS x 4 days. Began having confusion and chills about 4 days ago, per daughter followed by multiple episodes of emesis, diarrhea and poor PO intake.  In the ED, it was found that they had hypokalemia, lactic acidosis, elevated troponins, pyuria. CT head showed mild atrophy and white matter microvascular ischemic changes. Nothing acute was identified. No previous to compare to. CT abdomen/pelvis positive for diffuse wall thickening of colon and distal small bowel consistent with inflammatory or infectious enterocolitis. Also signs of chronic vs acute cholecystitis. RUQ [place] positive for borderline gallbladder hydrops and positive sonographic murphy sign. Surgery was consulted.  They were treated with supportive care including electrolyte repletion as well as antibiotics for UTI.   Patient was admitted to medicine service for further workup and management of AMS as outlined in detail below.  Overnight, patient had an event of Afib RVR which is presumably a new diagnosis for her. She was started on diltiazem bolus and drip which controlled her rate and she remained hemodynamically stable. Cardiology was consulted to evaluate.   09/08/21 -stable  Assessment & Plan  Principal Problem:   Acute metabolic encephalopathy Active Problems:   Enterocolitis   UTI (urinary tract infection)   Hypokalemia  Acute metabolic encephalopathy- improved since admission with correction of underlying metabolic disorders but still not at baseline.   New onset Afib RVR   elevated BP- converted to NSR overnight with metoprolol and diltiazem.  - cardiology following, appreciate recs  - initiating heparin gtt  -  dilt 60mg  q6h - f/u echo  TSH low- sign of hyperthyroid and could be contributing to the afib. Will obtain T3, T4 and thyroid US  Gallbladder disease- - general surgery following, appreciate recs. Does not recommend urgent surgical intervention - analgesia PRN and nutritional support   Hypokalemia- (present on admission) 2.7>2.6 despite repletion. Repeat is 3.4. Mg++ wnl. Secondary to GI losses from nausea, vomiting and diarrhea - Supplement potassium - BMP in afternoon and am   UTI (urinary tract infection)- (present on admission) Patient has significant pyuria - continue Rocephin  - follow UCx   Enterocolitis- (present on admission) cdiff and GI pathogen panel both negative. Likely viral vs inflammatory - supportive care and treat underlying conditions - dc flagyl  Body mass index is 20.09 kg/m.  VTE ppx: enoxaparin (LOVENOX) injection 40 mg Start: 09/07/21 2000   Diet:     Diet   Diet clear liquid Room service appropriate? Yes; Fluid consistency: Thin   Subjective 09/08/21    Pt reports she is doing well overall. Denies any complaints. Denies baseline oxygen requirement.   Objective   Vitals:   09/08/21 0346 09/08/21 0400 09/08/21 0524 09/08/21 0753  BP: (!) 148/66 (!) 159/74 (!) 161/62 (!) 157/72  Pulse: 76 100 (!) 105   Resp: 20 (!) 24 18 (!) 21  Temp: 98.2 F (36.8 C) 99.3 F (37.4 C) 97.8 F (36.6 C)   TempSrc:      SpO2: 98%  95%   Weight:      Height:        Intake/Output Summary (Last 24 hours) at 09/08/2021 [id number] Last data filed  at 09/08/2021 0537 Gross per 24 hour  Intake 0 ml  Output 2 ml  Net -2 ml   Filed Weights   09/07/21 1106  Weight: 63.5 kg     Physical Exam:  General: awake, alert, NAD HEENT: atraumatic, clear conjunctiva, anicteric sclera, MMM, hearing grossly normal Respiratory: normal respiratory effort. Cardiovascular: normal S1/S2, RRR, no JVD, murmurs, quick capillary refill  Gastrointestinal: soft, NT, ND Nervous: A&O  x2. no gross focal neurologic deficits, normal speech Extremities: moves all equally, no edema, normal tone Skin: dry, intact, normal temperature, normal color. No rashes, lesions or ulcers on exposed skin  Labs   I have personally reviewed the following labs and imaging studies CBC    Component Value Date/Time   WBC 13.4 (H) 09/08/2021 0336   RBC 4.68 09/08/2021 0336   HGB 14.8 09/08/2021 0336   HCT 43.6 09/08/2021 0336   PLT 264 09/08/2021 0336   MCV 93.2 09/08/2021 0336   MCH 31.6 09/08/2021 0336   MCHC 33.9 09/08/2021 0336   RDW 12.6 09/08/2021 0336   LYMPHSABS 0.7 09/07/2021 1109   MONOABS 0.5 09/07/2021 1109   EOSABS 0.0 09/07/2021 1109   BASOSABS 0.0 09/07/2021 1109   BMP Latest Ref Rng & Units 09/08/2021 09/07/2021  Glucose 70 - 99 mg/dL 102(H) 117(H)  BUN 8 - 23 mg/dL 25(H) 24(H)  Creatinine 0.44 - 1.00 mg/dL 0.89 0.92  Sodium 135 - 145 mmol/L 149(H) 144  Potassium 3.5 - 5.1 mmol/L 2.6(LL) 2.7(LL)  Chloride 98 - 111 mmol/L 111 104  CO2 22 - 32 mmol/L 22 23  Calcium 8.9 - 10.3 mg/dL 8.5(L) 9.4    CT Head Wo Contrast  Result Date: 09/07/2021 CLINICAL DATA:  Nausea, vomiting and diarrhea, altered mental status EXAM: CT HEAD WITHOUT CONTRAST TECHNIQUE: Contiguous axial images were obtained from the base of the skull through the vertex without intravenous contrast. RADIATION DOSE REDUCTION: This exam was performed according to the departmental dose-optimization program which includes automated exposure control, adjustment of the mA and/or kV according to patient size and/or use of iterative reconstruction technique. COMPARISON:  None. FINDINGS: Brain: Mild atrophy pattern and white matter microvascular ischemic changes. No acute intracranial hemorrhage, new mass lesion, new infarction, midline shift, herniation, hydrocephalus, or extra-axial fluid collection. No cerebellar abnormality. Vascular: Intracranial atherosclerosis. No hyperdense vessel. Skull: Normal. Negative for  fracture or focal lesion. Sinuses/Orbits: No acute finding. Other: None. IMPRESSION: 1. Mild atrophy and white matter microvascular ischemic changes. 2. No acute intracranial abnormality by noncontrast CT. Electronically Signed   By: Jerilynn Mages.  Shick M.D.   On: 09/07/2021 12:45   CT ABDOMEN PELVIS W CONTRAST  Result Date: 09/07/2021 CLINICAL DATA:  Abdominal pain EXAM: CT ABDOMEN AND PELVIS WITH CONTRAST TECHNIQUE: Multidetector CT imaging of the abdomen and pelvis was performed using the standard protocol following bolus administration of intravenous contrast. RADIATION DOSE REDUCTION: This exam was performed according to the departmental dose-optimization program which includes automated exposure control, adjustment of the mA and/or kV according to patient size and/or use of iterative reconstruction technique. CONTRAST:  37mL OMNIPAQUE IOHEXOL 300 MG/ML  SOLN COMPARISON:  None. FINDINGS: Lower chest: Increased interstitial markings in the periphery of both lower lung fields may suggest scarring. Hepatobiliary: No focal abnormality is seen in the liver. Gallbladder is distended. There is mild diffuse wall thickening in the gallbladder. There is no fluid around the gallbladder. Distal common bile duct in the head of the pancreas measures 8 mm in diameter. Pancreas: There is atrophy.  No  focal abnormality is seen. Spleen: Unremarkable. Adrenals/Urinary Tract: Adrenals are unremarkable. There is no hydronephrosis. There are scattered calcifications in the right renal artery branches. No definite renal or ureteral stones are seen. Urinary bladder is not distended. There is mild diffuse wall thickening in the bladder which may be due to incomplete distention or cystitis. Stomach/Bowel: Stomach is unremarkable. There is mild dilation of proximal small bowel loops. There is mild diffuse wall thickening in the distal small bowel loops. Appendix is not dilated. There is diffuse wall thickening in the colon. Liquid stool is seen  in the the lumen of rectosigmoid. Vascular/Lymphatic: There are scattered atherosclerotic plaques and calcifications. There is ectasia of infrarenal abdominal aorta measuring 3.4 x 3.3 cm. There is no demonstrable retroperitoneal hematoma. There are coarse calcifications in the major branches of aorta. There is possible high-grade stenosis in right common iliac artery Reproductive: Uterus is not seen. Other: There is no pneumoperitoneum. Minimal amount of free fluid is seen in the pelvis, possibly due to inflammation of distal small bowel loops and colon. Musculoskeletal: Degenerative changes are noted in the lumbar spine with disc space narrowing, bony spurs, facet hypertrophy and encroachment of neural foramina at multiple levels, more so at L3-L4 L4-L5 and L5-S1 levels. IMPRESSION: There is diffuse wall thickening in colon. There is diffuse wall thickening in the distal small bowel loops. Findings suggest inflammatory or infectious enterocolitis. There is no evidence of intestinal obstruction or pneumoperitoneum. There is no hydronephrosis. Gallbladder is distended with wall thickening. This may be due to chronic cholecystitis. If there are focal symptoms in the right upper quadrant, gallbladder sonogram may be considered. 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 Other findings as described in the body of the report. Electronically Signed   By: Ernie Avena M.D.   On: 09/07/2021 12:56   DG Chest Port 1 View  Result Date: 09/08/2021 CLINICAL DATA:  Chest pain EXAM: PORTABLE CHEST 1 VIEW COMPARISON:  09/07/2021 FINDINGS: Heart is normal size. Aortic atherosclerosis. Interstitial coarsening throughout the lungs again noted, unchanged. Continued opacity at the left base could reflect chronic interstitial changes or developing infiltrate. No effusions. No real change since prior study. IMPRESSION: Stable chronic interstitial  coarsening. Slight increased density at the left base could also be chronic interstitial changes although early infiltrate cannot be excluded. Electronically Signed   By: Charlett Nose M.D.   On: 09/08/2021 03:46   DG Chest Portable 1 View  Result Date: 09/07/2021 CLINICAL DATA:  Altered mental status.  Weakness. EXAM: PORTABLE CHEST 1 VIEW COMPARISON:  None. FINDINGS: 1130 hours. Lungs are hyperexpanded. Interstitial markings are diffusely coarsened with chronic features. Subtle disease at the left base may reflect chronic interstitial changes although subtle pneumonia not excluded. The cardiopericardial silhouette is within normal limits for size. The visualized bony structures of the thorax are unremarkable. Telemetry leads overlie the chest. IMPRESSION: 1. Hyperexpansion with chronic interstitial coarsening. 2. Subtle opacity at the left base may reflect chronic interstitial changes although subtle pneumonia not excluded. Electronically Signed   By: Kennith Center M.D.   On: 09/07/2021 11:45   US Abdomen Limited RUQ (LIVER/GB)  Result Date: 09/07/2021 CLINICAL DATA:  Right upper quadrant abdominal pain. EXAM: ULTRASOUND ABDOMEN LIMITED RIGHT UPPER QUADRANT COMPARISON:  Abdominopelvic CT same date. FINDINGS: Gallbladder: Borderline gallbladder wall thickening to 3-4 mm. The gallbladder is mildly distended, measuring up to 8.7 cm in length. No evidence of cholelithiasis, pericholecystic fluid or sonographic  Murphy sign. Common bile duct: Diameter: 8 mm. The common bile duct tapers distally. No evidence of choledocholithiasis or intrahepatic biliary dilatation. Liver: No focal lesion identified. Within normal limits in parenchymal echogenicity. Portal vein is patent on color Doppler imaging with normal direction of blood flow towards the liver. Other: None. IMPRESSION: 1. Nonspecific borderline gallbladder hydrops with borderline gallbladder wall thickening. No evidence of cholelithiasis or sonographic Murphy  sign. 2. Mild extrahepatic biliary dilatation without evidence of choledocholithiasis. Electronically Signed   By: Richardean Sale M.D.   On: 09/07/2021 14:07    Disposition Plan & Communication  Patient status: Inpatient  Admitted From: Home Planned disposition location: Relative's home Anticipated discharge date: 3/6 pending further medical evaluation  Family Communication: none    Author: Richarda Osmond, DO [facility] 09/08/2021, 7:54 AM   Available by Epic secure chat 7AM-7PM. If 7PM-7AM, please contact night-coverage.  [facility] contact information found on [email or web address].

## 2021-09-08 NOTE — Progress Notes (Signed)
Subjective:  ?CC: ?Joy Clark is a 71 y.o. female  Hospital stay day 1,   gallbladder hydrops ? ?HPI: ?Afib with RVR reported overnight.  No change in mentation noted.  One more loose BM.   ? ?ROS:  ?Unable to obtain secondary to mentation ? ? ?Objective:  ? ?Temp:  [97.6 ?F (36.4 ?C)-99.3 ?F (37.4 ?C)] 97.7 ?F (36.5 ?C) (03/02 1644) ?Pulse Rate:  [48-162] 93 (03/02 1644) ?Resp:  [17-34] 22 (03/02 1644) ?BP: (112-187)/(62-118) 187/82 (03/02 1644) ?SpO2:  [95 %-99 %] 97 % (03/02 1644)     Height: 5\' 10"  (177.8 cm) Weight: 63.5 kg BMI (Calculated): 20.09  ? ?Intake/Output this shift:  ? ?Intake/Output Summary (Last 24 hours) at 09/08/2021 1924 ?Last data filed at 09/08/2021 1700 ?Gross per 24 hour  ?Intake 273.27 ml  ?Output 2 ml  ?Net 271.27 ml  ? ? ?Constitutional :  no distress, slowed mentation, and shakes head to yes no questions sometime  ?Respiratory:  clear to auscultation bilaterally  ?Cardiovascular:  regular rate and rhythm  ?Gastrointestinal: soft, non-tender; bowel sounds normal; no masses,  no organomegaly.   ?Skin: Cool and moist.   ?   ?   ?  ?LABS:  ?CMP Latest Ref Rng & Units 09/08/2021 09/08/2021 09/07/2021  ?Glucose 70 - 99 mg/dL - 11/07/2021) 960(A)  ?BUN 8 - 23 mg/dL - 540(J) 81(X)  ?Creatinine 0.44 - 1.00 mg/dL - 91(Y 7.82  ?Sodium 135 - 145 mmol/L - 149(H) 144  ?Potassium 3.5 - 5.1 mmol/L 3.4(L) 2.6(LL) 2.7(LL)  ?Chloride 98 - 111 mmol/L - 111 104  ?CO2 22 - 32 mmol/L - 22 23  ?Calcium 8.9 - 10.3 mg/dL - 8.5(L) 9.4  ?Total Protein 6.5 - 8.1 g/dL - - 7.6  ?Total Bilirubin 0.3 - 1.2 mg/dL - - 0.5  ?Alkaline Phos 38 - 126 U/L - - 144(H)  ?AST 15 - 41 U/L - - 36  ?ALT 0 - 44 U/L - - 25  ? ?CBC Latest Ref Rng & Units 09/08/2021 09/07/2021 09/07/2021  ?WBC 4.0 - 10.5 K/uL 13.4(H) 13.7(H) 13.6(H)  ?Hemoglobin 12.0 - 15.0 g/dL 11/07/2021 16.2(H) 16.3(H)  ?Hematocrit 36.0 - 46.0 % 43.6 47.1(H) 47.7(H)  ?Platelets 150 - 400 K/uL 264 263 259  ? ? ?RADS: ?N/a ?Assessment:  ? ?Gallbladder hydrops noted. Abdominal exam continues to be  overall unremarkable.  Current clinical issues likely not related to gallbladder.  Continue care per hospitalist.  Ok to advance diet as tolerated ? ?labs/images/medications/previous chart entries reviewed personally and relevant changes/updates noted above. ? ? ?

## 2021-09-08 NOTE — Consult Note (Addendum)
ANTICOAGULATION CONSULT NOTE - Initial Consult ? ?Pharmacy Consult for Heparin ?Indication: atrial fibrillation ? ?Not on File ? ?Patient Measurements: ?Height: 5\' 10"  (177.8 cm) ?Weight: 63.5 kg (140 lb) ?IBW/kg (Calculated) : 68.5 ?Heparin Dosing Weight: 63.5 kg ? ?Vital Signs: ?Temp: 97.8 ?F (36.6 ?C) (03/02 1101) ?Temp Source: Axillary (03/02 1101) ?BP: 181/95 (03/02 1101) ?Pulse Rate: 96 (03/02 1101) ? ?Labs: ?Recent Labs  ?  09/07/21 ?1109 09/07/21 ?1418 09/08/21 ?0336  ?HGB 16.2*  16.3*  --  14.8  ?HCT 47.1*  47.7*  --  43.6  ?PLT 263  259  --  264  ?CREATININE 0.92  --  0.89  ?TROPONINIHS 45* 48*  --   ? ? ?Estimated Creatinine Clearance: 58.1 mL/min (by C-G formula based on SCr of 0.89 mg/dL). ? ? ?Medical History: ?Past Medical History:  ?Diagnosis Date  ? Fibromyalgia   ? ? ?Medications:  ?No DOAC PTA. Was on enoxaparin for DVT ppx.  ? ?Assessment: ?71 year old female presented with emesis and diarrhea. Pt was found to be in afib and was given ditl 15 IV x 1 and converted back to NSR. Cards following and want to initiate heparin and dilt gtt. CHADSVASc 2, low risk of clotting. No know cardiac history. Pt currently has issues with PO medications. Speech to follow.  ? ?Goal of Therapy:  ?Heparin level 0.3-0.7 units/ml ?Monitor platelets by anticoagulation protocol: Yes ?  ?Plan:  ?Give 1500 units bolus x 1, gave half bolus due to pt receiving enoxaparin 3/1 PM.  ?Start heparin infusion at 850 units/hr ?Check anti-Xa level in 8 hours and daily while on heparin ?Continue to monitor H&H and platelets ? ?62, PharmD, BCPS ?09/08/2021,11:44 AM ? ? ?

## 2021-09-08 NOTE — Consult Note (Signed)
ANTICOAGULATION CONSULT NOTE ? ?Pharmacy Consult for Heparin ?Indication: atrial fibrillation ? ?Not on File ? ?Patient Measurements: ?Height: 5\' 10"  (177.8 cm) ?Weight: 63.5 kg (140 lb) ?IBW/kg (Calculated) : 68.5 ?Heparin Dosing Weight: 63.5 kg ? ?Vital Signs: ?Temp: 97.7 ?F (36.5 ?C) (03/02 1644) ?Temp Source: Axillary (03/02 1644) ?BP: 187/82 (03/02 1644) ?Pulse Rate: 93 (03/02 1644) ? ?Labs: ?Recent Labs  ?  09/07/21 ?1109 09/07/21 ?1418 09/08/21 ?0336 09/08/21 ?1259 09/08/21 ?1900  ?HGB 16.2*  16.3*  --  14.8  --   --   ?HCT 47.1*  47.7*  --  43.6  --   --   ?PLT 263  259  --  264  --   --   ?APTT  --   --   --  77*  --   ?LABPROT  --   --   --  16.4*  --   ?INR  --   --   --  1.3*  --   ?HEPARINUNFRC  --   --   --   --  0.29*  ?CREATININE 0.92  --  0.89  --   --   ?TROPONINIHS 45* 48*  --   --   --   ? ? ? ?Estimated Creatinine Clearance: 58.1 mL/min (by C-G formula based on SCr of 0.89 mg/dL). ? ? ?Medical History: ?Past Medical History:  ?Diagnosis Date  ? Fibromyalgia   ? ? ?Medications:  ?No DOAC PTA. Was on enoxaparin for DVT ppx.  ? ?Assessment: ?71 year old female presented with emesis and diarrhea. Pt was found to be in afib and was given ditl 15 IV x 1 and converted back to NSR. Cards following and want to initiate heparin and dilt gtt. CHADSVASc 2, low risk of clotting. No know cardiac history. Pt currently has issues with PO medications. Speech to follow.  ? ?Date Time HL Rate/comment ?3/2 1900 0.29 850 un/hr, subthera ? ?Goal of Therapy:  ?Heparin level 0.3-0.7 units/ml ?Monitor platelets by anticoagulation protocol: Yes ?  ?Plan:  ?Heparin level 0.29, slightly subtherapeutic ?Increase heparin infusion to 1000 units/hr ?Check anti-Xa level in 8 hours and daily while on heparin ?Continue to monitor H&H and platelets ? ?Darnelle Bos, PharmD ?09/08/2021,7:45 PM ? ? ?

## 2021-09-08 NOTE — Progress Notes (Signed)
Cross Cover ?Patient developed atrial fib with RVR - confirmed by EKG - rates 160' - 170's/ BP stable and mentation at baseline from admit . ?Chest xray again shows increased density in left base chronic vs early infection  ?- no improvement in heart rhythm/rate with dose of IV metop ?-NSR in 70's shortly after diltiazem 15 mg bolus ?Transfer to 2 A for additional diltiazem bolus and drip should she need ?-procal added to am labs ? ?

## 2021-09-08 NOTE — Significant Event (Signed)
Rapid Response Event Note  ? ?Reason for Call : Rapid heart rate ? ? ?Initial Focused Assessment: Pt in bed, heart rate 160s sustained on monitor. 12 ekg shows afib rvr. Pt in no obvious distress, is confused at baseline ? ? ? ? ? ?Interventions: 12 lead ekg, chest xray, metoprolol 5 mg, Diltiazem 15 mg. NP Randol Kern at bedside. Pt returned to nsr, blood pressure wnl.  ? ? ?Plan of Care: Pt will be transferred to pcu for further monitoring and possible diltiazem.  ? ? ? ?Event Summary: Pt treated and will be tranferred to pcu. ? ?MD Notified: Randol Kern NP ?Call [place] ?Arrival [id number] ?End Time:0330 ? ?Shraga Custard A, RN ?

## 2021-09-08 NOTE — Consult Note (Signed)
Cardiology Consultation:   Patient ID: Joy Clark MRN: [id number]; DOB: [birth year]  Admit date: 09/07/2021 Date of Consult: 09/08/2021  PCP:  Oneita Hurt No   [facility] Providers Cardiologist:  New  Patient Profile:   Joy Clark is a 71 y.o. female with a hx of nicotine dependence, chronic pain syndome, dyslipidemia who is being seen 09/08/2021 for the evaluation of afib RVR at the request of Dr. Dareen Piano.  History of Present Illness:   History obtained by chart review given AMS.    Ms. Joy Clark with no known cardiac history.   The patient was brought to the hospital for AMS for 4 days. She is visiting her daughter in [place]. AT baseline she is awake and plsent. Reported chills and confusion alone with diarrhea and vomiting. Not incontinent.   In the ER found to have UTI and hypokalemia with K2.6. BP 184/95, pulse 114, RR 25, afebrile. Other labs showed WBC 13.7, Hgb 16.2, HCT 47.1, platelet count 263, LA 1.3. HS trop 45>48. Mag 1.9. CXR showed hyperinflated lungs, CT head without acute process and microvascular ischemic changes. CT abd/pelvis showed diffuse colon thickening, inflammatory or infectious enterocolitis, infrarenal aortic aneurysm, gallbladder hydrops. EKG showed ST with PVCs.   On 3/2 noted to go into Afib RVR and cardiology was consulted. CXR showed increased density in left base chronic vs early infection \. She was given IV metoprolol. Started on IV dilt bolus and she converted to NSR.   Past Medical History:  Diagnosis Date   Fibromyalgia     History reviewed. No pertinent surgical history.   Home Medications:  Prior to Admission medications   Medication Sig Start Date End Date Taking? Authorizing Provider  albuterol (VENTOLIN HFA) 108 (90 Base) MCG/ACT inhaler Inhale into the lungs every 6 (six) hours as needed for wheezing or shortness of breath.   Yes [provider]  atorvastatin (LIPITOR) 40 MG tablet Take 40 mg by mouth daily.   Yes [provider]  carboxymethylcellulose 1 % ophthalmic solution 1 drop 3 (three) times daily.   Yes [provider]  celecoxib (CELEBREX) 200 MG capsule Take 200 mg by mouth 2 (two) times daily.   Yes [provider]  cilostazol (PLETAL) 100 MG tablet Take 100 mg by mouth 2 (two) times daily.   Yes [provider]  fluticasone (FLONASE) 50 MCG/ACT nasal spray Place into both nostrils daily.   Yes [provider]  gabapentin (NEURONTIN) 800 MG tablet Take 800 mg by mouth 3 (three) times daily.   Yes [provider]  omeprazole (PRILOSEC) 40 MG capsule Take 40 mg by mouth daily.   Yes [provider]  ondansetron (ZOFRAN) 8 MG tablet Take by mouth every 8 (eight) hours as needed for nausea or vomiting.   Yes [provider]  oxyCODONE-acetaminophen (PERCOCET) 10-325 MG tablet Take 1 tablet by mouth every 4 (four) hours as needed for pain.   Yes [provider]  sodium chloride (OCEAN) 0.65 % SOLN nasal spray Place 1 spray into both nostrils as needed for congestion.   Yes [provider]    Inpatient Medications: Scheduled Meds:  atorvastatin  40 mg Oral Daily   enoxaparin (LOVENOX) injection  40 mg Subcutaneous Q24H   pantoprazole (PROTONIX) IV  40 mg Intravenous Q24H   potassium chloride  40 mEq Oral Once   Continuous Infusions:  cefTRIAXone (ROCEPHIN)  IV     diltiazem (CARDIZEM) infusion     lactated ringers with kcl 100  mL/hr at 09/07/21 1643   metronidazole 500 mg (09/07/21 2302)   PRN Meds: albuterol, ondansetron **OR** ondansetron (ZOFRAN) IV  Allergies:   Not on File  Social History:   Social History   Socioeconomic History   Marital status: Widowed    Spouse name: Not on file   Number of children: Not on file   Years of education: Not on file   Highest education level: Not on file  Occupational History   Not on file  Tobacco Use   Smoking status: Every Day    Types: Cigarettes    Smokeless tobacco: Not on file  Substance and Sexual Activity   Alcohol use: Not on file   Drug use: Not on file   Sexual activity: Not on file  Other Topics Concern   Not on file  Social History Narrative   Not on file   Social Determinants of Health   Financial Resource Strain: Not on file  Food Insecurity: Not on file  Transportation Needs: Not on file  Physical Activity: Not on file  Stress: Not on file  Social Connections: Not on file  Intimate Partner Violence: Not on file    Family History:   History reviewed. No pertinent family history.   ROS:  Please see the history of present illness.   All other ROS reviewed and negative.     Physical Exam/Data:   Vitals:   09/08/21 0524 09/08/21 0753 09/08/21 0800 09/08/21 1101  BP: (!) 161/62 (!) 157/72  (!) 181/95  Pulse: (!) 105 100  96  Resp: 18 (!) 21 19 (!) 23  Temp: 97.8 F (36.6 C) 98.2 F (36.8 C)  97.8 F (36.6 C)  TempSrc:  Axillary  Axillary  SpO2: 95% 99%  96%  Weight:      Height:        Intake/Output Summary (Last 24 hours) at 09/08/2021 1106 Last data filed at 09/08/2021 1103 Gross per 24 hour  Intake 120 ml  Output 2 ml  Net 118 ml   Last 3 Weights 09/07/2021  Weight (lbs) 140 lb  Weight (kg) 63.504 kg     Body mass index is 20.09 kg/m.  General:  Well nourished, well developed, in no acute distress HEENT: normal Neck: no JVD Vascular: No carotid bruits; Distal pulses 2+ bilaterally Cardiac:  normal S1, S2; RRR; no murmur  Lungs:  clear to auscultation bilaterally, no wheezing, rhonchi or rales  Abd: soft, nontender, no hepatomegaly  Ext: no edema Musculoskeletal:  No deformities, BUE and BLE strength normal and equal Skin: warm and dry  Neuro:  CNs 2-12 intact, no focal abnormalities noted Psych:  Normal affect   EKG:  The EKG was personally reviewed and demonstrates:  Afib RVR, 180bpm, PVC, rate related changes Telemetry:  Telemetry was personally reviewed and demonstrates:  Afib>NSR  overnight. NSR, 90-100, PVCs  Relevant CV Studies:  Echo ordered  Laboratory Data:  High Sensitivity Troponin:   Recent Labs  Lab 09/07/21 1109 09/07/21 1418  TROPONINIHS 45* 48*     Chemistry Recent Labs  Lab 09/07/21 1109 09/08/21 0336 09/08/21 0338  NA 144 149*  --   K 2.7* 2.6*  --   CL 104 111  --   CO2 23 22  --   GLUCOSE 117* 102*  --   BUN 24* 25*  --   CREATININE 0.92 0.89  --   CALCIUM 9.4 8.5*  --   MG 1.9  --  1.9  GFRNONAA >60 >60  --  ANIONGAP 17* 16*  --     Recent Labs  Lab 09/07/21 1109  PROT 7.6  ALBUMIN 3.2*  AST 36  ALT 25  ALKPHOS 144*  BILITOT 0.5   Lipids No results for input(s): CHOL, TRIG, HDL, LABVLDL, LDLCALC, CHOLHDL in the last 168 hours.  Hematology Recent Labs  Lab 09/07/21 1109 09/08/21 0336  WBC 13.7*   13.6* 13.4*  RBC 5.07   5.12* 4.68  HGB 16.2*   16.3* 14.8  HCT 47.1*   47.7* 43.6  MCV 92.9   93.2 93.2  MCH 32.0   31.8 31.6  MCHC 34.4   34.2 33.9  RDW 12.6   12.5 12.6  PLT 263   259 264   Thyroid No results for input(s): TSH, FREET4 in the last 168 hours.  BNPNo results for input(s): BNP, PROBNP in the last 168 hours.  DDimer  Recent Labs  Lab 09/08/21 [id number]  DDIMER 6.42*     Radiology/Studies:  CT Head Wo Contrast  Result Date: 09/07/2021 CLINICAL DATA:  Nausea, vomiting and diarrhea, altered mental status EXAM: CT HEAD WITHOUT CONTRAST TECHNIQUE: Contiguous axial images were obtained from the base of the skull through the vertex without intravenous contrast. RADIATION DOSE REDUCTION: This exam was performed according to the departmental dose-optimization program which includes automated exposure control, adjustment of the mA and/or kV according to patient size and/or use of iterative reconstruction technique. COMPARISON:  None. FINDINGS: Brain: Mild atrophy pattern and white matter microvascular ischemic changes. No acute intracranial hemorrhage, new mass lesion, new infarction, midline shift, herniation,  hydrocephalus, or extra-axial fluid collection. No cerebellar abnormality. Vascular: Intracranial atherosclerosis. No hyperdense vessel. Skull: Normal. Negative for fracture or focal lesion. Sinuses/Orbits: No acute finding. Other: None. IMPRESSION: 1. Mild atrophy and white matter microvascular ischemic changes. 2. No acute intracranial abnormality by noncontrast CT. Electronically Signed   By: Judie Petit.  Shick M.D.   On: 09/07/2021 12:45   CT ABDOMEN PELVIS W CONTRAST  Result Date: 09/07/2021 CLINICAL DATA:  Abdominal pain EXAM: CT ABDOMEN AND PELVIS WITH CONTRAST TECHNIQUE: Multidetector CT imaging of the abdomen and pelvis was performed using the standard protocol following bolus administration of intravenous contrast. RADIATION DOSE REDUCTION: This exam was performed according to the departmental dose-optimization program which includes automated exposure control, adjustment of the mA and/or kV according to patient size and/or use of iterative reconstruction technique. CONTRAST:  12mL OMNIPAQUE IOHEXOL 300 MG/ML  SOLN COMPARISON:  None. FINDINGS: Lower chest: Increased interstitial markings in the periphery of both lower lung fields may suggest scarring. Hepatobiliary: No focal abnormality is seen in the liver. Gallbladder is distended. There is mild diffuse wall thickening in the gallbladder. There is no fluid around the gallbladder. Distal common bile duct in the head of the pancreas measures 8 mm in diameter. Pancreas: There is atrophy.  No focal abnormality is seen. Spleen: Unremarkable. Adrenals/Urinary Tract: Adrenals are unremarkable. There is no hydronephrosis. There are scattered calcifications in the right renal artery branches. No definite renal or ureteral stones are seen. Urinary bladder is not distended. There is mild diffuse wall thickening in the bladder which may be due to incomplete distention or cystitis. Stomach/Bowel: Stomach is unremarkable. There is mild dilation of proximal small bowel  loops. There is mild diffuse wall thickening in the distal small bowel loops. Appendix is not dilated. There is diffuse wall thickening in the colon. Liquid stool is seen in the the lumen of rectosigmoid. Vascular/Lymphatic: There are scattered atherosclerotic plaques and calcifications. There is  ectasia of infrarenal abdominal aorta measuring 3.4 x 3.3 cm. There is no demonstrable retroperitoneal hematoma. There are coarse calcifications in the major branches of aorta. There is possible high-grade stenosis in right common iliac artery Reproductive: Uterus is not seen. Other: There is no pneumoperitoneum. Minimal amount of free fluid is seen in the pelvis, possibly due to inflammation of distal small bowel loops and colon. Musculoskeletal: Degenerative changes are noted in the lumbar spine with disc space narrowing, bony spurs, facet hypertrophy and encroachment of neural foramina at multiple levels, more so at L3-L4 L4-L5 and L5-S1 levels. IMPRESSION: There is diffuse wall thickening in colon. There is diffuse wall thickening in the distal small bowel loops. Findings suggest inflammatory or infectious enterocolitis. There is no evidence of intestinal obstruction or pneumoperitoneum. There is no hydronephrosis. Gallbladder is distended with wall thickening. This may be due to chronic cholecystitis. If there are focal symptoms in the right upper quadrant, gallbladder sonogram may be considered. 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 Other findings as described in the body of the report. Electronically Signed   By: Ernie Avena M.D.   On: 09/07/2021 12:56   DG Chest Port 1 View  Result Date: 09/08/2021 CLINICAL DATA:  Chest pain EXAM: PORTABLE CHEST 1 VIEW COMPARISON:  09/07/2021 FINDINGS: Heart is normal size. Aortic atherosclerosis. Interstitial coarsening throughout the lungs again noted, unchanged. Continued opacity at the  left base could reflect chronic interstitial changes or developing infiltrate. No effusions. No real change since prior study. IMPRESSION: Stable chronic interstitial coarsening. Slight increased density at the left base could also be chronic interstitial changes although early infiltrate cannot be excluded. Electronically Signed   By: Charlett Nose M.D.   On: 09/08/2021 03:46   DG Chest Portable 1 View  Result Date: 09/07/2021 CLINICAL DATA:  Altered mental status.  Weakness. EXAM: PORTABLE CHEST 1 VIEW COMPARISON:  None. FINDINGS: 1130 hours. Lungs are hyperexpanded. Interstitial markings are diffusely coarsened with chronic features. Subtle disease at the left base may reflect chronic interstitial changes although subtle pneumonia not excluded. The cardiopericardial silhouette is within normal limits for size. The visualized bony structures of the thorax are unremarkable. Telemetry leads overlie the chest. IMPRESSION: 1. Hyperexpansion with chronic interstitial coarsening. 2. Subtle opacity at the left base may reflect chronic interstitial changes although subtle pneumonia not excluded. Electronically Signed   By: Kennith Center M.D.   On: 09/07/2021 11:45   US Abdomen Limited RUQ (LIVER/GB)  Result Date: 09/07/2021 CLINICAL DATA:  Right upper quadrant abdominal pain. EXAM: ULTRASOUND ABDOMEN LIMITED RIGHT UPPER QUADRANT COMPARISON:  Abdominopelvic CT same date. FINDINGS: Gallbladder: Borderline gallbladder wall thickening to 3-4 mm. The gallbladder is mildly distended, measuring up to 8.7 cm in length. No evidence of cholelithiasis, pericholecystic fluid or sonographic Murphy sign. Common bile duct: Diameter: 8 mm. The common bile duct tapers distally. No evidence of choledocholithiasis or intrahepatic biliary dilatation. Liver: No focal lesion identified. Within normal limits in parenchymal echogenicity. Portal vein is patent on color Doppler imaging with normal direction of blood flow towards the liver.  Other: None. IMPRESSION: 1. Nonspecific borderline gallbladder hydrops with borderline gallbladder wall thickening. No evidence of cholelithiasis or sonographic Murphy sign. 2. Mild extrahepatic biliary dilatation without evidence of choledocholithiasis. Electronically Signed   By: Carey Bullocks M.D.   On: 09/07/2021 14:07     Assessment and Plan:   Afib RVR - Afib RVR in the setting of UTI,  hypokalemia, naseua/vomiting - 3/2 patient went into afib RVR, converted with IV dilt bolus - She remains in SR - CHADSVASC at least 2 female, HTN. Unsure if she is PO, will start IV heparin for pharmacy - Needs rate control, will start diltiazem 60 Q6H, may need to continue IV diltiazem - check TSH - keep K>4 and Mag >2 - continue telemetry  HTN - BP has been elevated - start diltiazem as above  Hypokalemia - 2.6 on arrival - goal>4 - she was given supplemental K - IV K given AMS  AMS UTI Enterocolitis - ?etiology UTI/infection - WBC 13.4. LA normal - abx per IM - CT head showed no acute process, microvascular ischemic changes - may need MRI of head  For questions or updates, please contact [facility] Please consult [email or web address] for contact info under    Signed, Sheridyn Canino David StallH Treshun Wold, PA-C  09/08/2021 11:06 AM

## 2021-09-09 ENCOUNTER — Other Ambulatory Visit: Payer: Medicare Other

## 2021-09-09 ENCOUNTER — Inpatient Hospital Stay (HOSPITAL_COMMUNITY)
Admit: 2021-09-09 | Discharge: 2021-09-09 | Disposition: A | Discharge status: Home / Self Care | Payer: Medicare Other | Attending: Cardiovascular Disease | Admitting: Cardiovascular Disease

## 2021-09-09 DIAGNOSIS — E876 Hypokalemia: Secondary | ICD-10-CM | POA: Diagnosis not present

## 2021-09-09 DIAGNOSIS — I4891 Unspecified atrial fibrillation: Secondary | ICD-10-CM

## 2021-09-09 DIAGNOSIS — N390 Urinary tract infection, site not specified: Secondary | ICD-10-CM | POA: Diagnosis not present

## 2021-09-09 DIAGNOSIS — E870 Hyperosmolality and hypernatremia: Secondary | ICD-10-CM

## 2021-09-09 DIAGNOSIS — R4182 Altered mental status, unspecified: Secondary | ICD-10-CM | POA: Diagnosis not present

## 2021-09-09 DIAGNOSIS — G9341 Metabolic encephalopathy: Secondary | ICD-10-CM | POA: Diagnosis not present

## 2021-09-09 DIAGNOSIS — E860 Dehydration: Secondary | ICD-10-CM | POA: Diagnosis not present

## 2021-09-09 DIAGNOSIS — R932 Abnormal findings on diagnostic imaging of liver and biliary tract: Secondary | ICD-10-CM | POA: Diagnosis not present

## 2021-09-09 LAB — ECHOCARDIOGRAM COMPLETE
AR max vel: 2.84 cm2
AV Area VTI: 2.55 cm2
AV Area mean vel: 2.74 cm2
AV Mean grad: 3 mmHg
AV Peak grad: 5.7 mmHg
Ao pk vel: 1.19 m/s
Area-P 1/2: 4.46 cm2
Height: 70 in
MV VTI: 3.67 cm2
S' Lateral: 2.34 cm
Weight: 2240 oz

## 2021-09-09 LAB — COMPREHENSIVE METABOLIC PANEL
ALT: 18 U/L (ref 0–44)
AST: 21 U/L (ref 15–41)
Albumin: 3.2 g/dL — ABNORMAL LOW (ref 3.5–5.0)
Alkaline Phosphatase: 113 U/L (ref 38–126)
Anion gap: 10 (ref 5–15)
BUN: 31 mg/dL — ABNORMAL HIGH (ref 8–23)
CO2: 27 mmol/L (ref 22–32)
Calcium: 9.2 mg/dL (ref 8.9–10.3)
Chloride: 117 mmol/L — ABNORMAL HIGH (ref 98–111)
Creatinine, Ser: 0.87 mg/dL (ref 0.44–1.00)
GFR, Estimated: >60 mL/min (ref >60)
Glucose, Bld: 126 mg/dL — ABNORMAL HIGH (ref 70–99)
Potassium: 3.2 mmol/L — ABNORMAL LOW (ref 3.5–5.1)
Sodium: 154 mmol/L — ABNORMAL HIGH (ref 135–145)
Total Bilirubin: 0.8 mg/dL (ref 0.3–1.2)
Total Protein: 7.6 g/dL (ref 6.5–8.1)

## 2021-09-09 LAB — CBC
HCT: 50.6 % — ABNORMAL HIGH (ref 36.0–46.0)
Hemoglobin: 17.1 g/dL — ABNORMAL HIGH (ref 12.0–15.0)
MCH: 31.7 pg (ref 26.0–34.0)
MCHC: 33.8 g/dL (ref 30.0–36.0)
MCV: 93.9 fL (ref 80.0–100.0)
Platelets: 319 10*3/uL (ref 150–400)
RBC: 5.39 MIL/uL — ABNORMAL HIGH (ref 3.87–5.11)
RDW: 13 % (ref 11.5–15.5)
WBC: 15.5 10*3/uL — ABNORMAL HIGH (ref 4.0–10.5)
nRBC: 0 % (ref 0.0–0.2)

## 2021-09-09 LAB — HEPARIN LEVEL (UNFRACTIONATED)
Heparin Unfractionated: 0.43 IU/mL (ref 0.30–0.70)
Heparin Unfractionated: 0.46 IU/mL (ref 0.30–0.70)

## 2021-09-09 MED ORDER — METOPROLOL TARTRATE 5 MG/5ML IV SOLN
5.0000 mg | Freq: Once | INTRAVENOUS | Status: DC
Start: 2021-09-09 — End: 2021-09-09

## 2021-09-09 MED ORDER — CARVEDILOL 25 MG PO TABS
25.0000 mg | ORAL_TABLET | Freq: Two times a day (BID) | ORAL | Status: DC
  Administered 2021-09-09: 25 mg via ORAL
  Filled 2021-09-09: qty 1

## 2021-09-09 MED ORDER — POTASSIUM CHLORIDE 10 MEQ/100ML IV SOLN
10.0000 meq | INTRAVENOUS | Status: AC
  Administered 2021-09-09 (×6): 10 meq via INTRAVENOUS
  Filled 2021-09-09 (×6): qty 100

## 2021-09-09 MED ORDER — CARVEDILOL 25 MG PO TABS
25.0000 mg | ORAL_TABLET | Freq: Two times a day (BID) | ORAL | Status: DC
  Administered 2021-09-09 – 2021-09-18 (×18): 25 mg via ORAL
  Filled 2021-09-09 (×18): qty 1

## 2021-09-09 MED ORDER — LISINOPRIL 20 MG PO TABS
20.0000 mg | ORAL_TABLET | Freq: Every day | ORAL | Status: DC
  Administered 2021-09-09 – 2021-09-10 (×2): 20 mg via ORAL
  Filled 2021-09-09 (×2): qty 1

## 2021-09-09 MED ORDER — METOPROLOL TARTRATE 25 MG PO TABS
25.0000 mg | ORAL_TABLET | Freq: Two times a day (BID) | ORAL | Status: DC
  Administered 2021-09-09: 25 mg via ORAL
  Filled 2021-09-09: qty 1

## 2021-09-09 MED ORDER — POTASSIUM CL IN DEXTROSE 5% 20 MEQ/L IV SOLN
20.0000 meq | INTRAVENOUS | Status: DC
Start: 2021-09-09 — End: 2021-09-10
  Administered 2021-09-09 – 2021-09-10 (×2): 20 meq via INTRAVENOUS
  Filled 2021-09-09 (×3): qty 1000

## 2021-09-09 MED ORDER — METHIMAZOLE 5 MG PO TABS
5.0000 mg | ORAL_TABLET | Freq: Every day | ORAL | Status: DC
  Administered 2021-09-09 – 2021-09-18 (×10): 5 mg via ORAL
  Filled 2021-09-09 (×11): qty 1

## 2021-09-09 MED ORDER — CARVEDILOL 25 MG PO TABS: 25.0000 mg | ORAL_TABLET | Freq: Two times a day (BID) | ORAL | Status: DC

## 2021-09-09 MED ORDER — SPIRONOLACTONE 25 MG PO TABS
50.0000 mg | ORAL_TABLET | Freq: Every day | ORAL | Status: DC
  Administered 2021-09-10 – 2021-09-18 (×9): 50 mg via ORAL
  Filled 2021-09-09 (×9): qty 2

## 2021-09-09 MED ORDER — SPIRONOLACTONE 25 MG PO TABS
25.0000 mg | ORAL_TABLET | Freq: Once | ORAL | Status: AC
  Administered 2021-09-09: 25 mg via ORAL
  Filled 2021-09-09: qty 1

## 2021-09-09 MED ORDER — SPIRONOLACTONE 25 MG PO TABS
25.0000 mg | ORAL_TABLET | Freq: Every day | ORAL | Status: DC
  Administered 2021-09-09: 25 mg via ORAL
  Filled 2021-09-09: qty 1

## 2021-09-09 NOTE — Progress Notes (Signed)
Progress Note  Patient Name: Joy Clark Date of Encounter: 09/09/2021  [facility] Cardiologist: [place]   Subjective   Patient still confused. Echo showed normal pump function. She remains in NSR. BP elevated . She is taking some PO meds.   Inpatient Medications    Scheduled Meds:  atorvastatin  40 mg Oral Daily   diltiazem  60 mg Oral Q6H   methimazole  5 mg Oral Daily   metoprolol tartrate  25 mg Oral BID   pantoprazole (PROTONIX) IV  40 mg Intravenous Q24H   spironolactone  25 mg Oral Daily   Continuous Infusions:  cefTRIAXone (ROCEPHIN)  IV 2 g (09/08/21 1329)   diltiazem (CARDIZEM) infusion 15 mg/hr (09/09/21 0427)   heparin 1,000 Units/hr (09/09/21 0427)   lactated ringers with kcl 100 mL/hr at 09/07/21 1643   potassium chloride     PRN Meds: albuterol, ondansetron **OR** ondansetron (ZOFRAN) IV   Vital Signs    Vitals:   09/09/21 0355 09/09/21 0400 09/09/21 0405 09/09/21 0410  BP:   (!) 194/86   Pulse:      Resp: (!) 25 (!) 24 (!) 25 (!) 24  Temp:      TempSrc:      SpO2:      Weight:      Height:        Intake/Output Summary (Last 24 hours) at 09/09/2021 0818 Last data filed at 09/09/2021 0427 Gross per 24 hour  Intake 443.19 ml  Output 25 ml  Net 418.19 ml   Last 3 Weights 09/07/2021  Weight (lbs) 140 lb  Weight (kg) 63.504 kg      Telemetry    NSR, HR around 100 3 beats NSVT - Personally Reviewed  ECG    No new - Personally Reviewed  Physical Exam   GEN: No acute distress.   Neck: No JVD Cardiac: RRR, no murmurs, rubs, or gallops.  Respiratory: Clear to auscultation bilaterally. GI: Soft, nontender, non-distended  MS: No edema; No deformity. Neuro: A&Ox1 Psych: Normal affect   Labs    High Sensitivity Troponin:   Recent Labs  Lab 09/07/21 1109 09/07/21 1418  TROPONINIHS 45* 48*     Chemistry Recent Labs  Lab 09/07/21 1109 09/08/21 0336 09/08/21 0338 09/08/21 1259 09/09/21 0554  NA 144 149*  --   --  154*  K  2.7* 2.6*  --  3.4* 3.2*  CL 104 111  --   --  117*  CO2 23 22  --   --  27  GLUCOSE 117* 102*  --   --  126*  BUN 24* 25*  --   --  31*  CREATININE 0.92 0.89  --   --  0.87  CALCIUM 9.4 8.5*  --   --  9.2  MG 1.9  --  1.9  --   --   PROT 7.6  --   --   --  7.6  ALBUMIN 3.2*  --   --   --  3.2*  AST 36  --   --   --  21  ALT 25  --   --   --  18  ALKPHOS 144*  --   --   --  113  BILITOT 0.5  --   --   --  0.8  GFRNONAA >60 >60  --   --  >60  ANIONGAP 17* 16*  --   --  10    Lipids No results for input(s): CHOL,  TRIG, HDL, LABVLDL, LDLCALC, CHOLHDL in the last 168 hours.  Hematology Recent Labs  Lab 09/07/21 1109 09/08/21 0336 09/09/21 0554  WBC 13.7*   13.6* 13.4* 15.5*  RBC 5.07   5.12* 4.68 5.39*  HGB 16.2*   16.3* 14.8 17.1*  HCT 47.1*   47.7* 43.6 50.6*  MCV 92.9   93.2 93.2 93.9  MCH 32.0   31.8 31.6 31.7  MCHC 34.4   34.2 33.9 33.8  RDW 12.6   12.5 12.6 13.0  PLT 263   259 264 319   Thyroid  Recent Labs  Lab 09/08/21 0336 09/08/21 1259  TSH  --  0.010*  FREET4 2.24*  --     BNPNo results for input(s): BNP, PROBNP in the last 168 hours.  DDimer  Recent Labs  Lab 09/08/21 [phone]  DDIMER 6.42*     Radiology    CT Head Wo Contrast  Result Date: 09/07/2021 CLINICAL DATA:  Nausea, vomiting and diarrhea, altered mental status EXAM: CT HEAD WITHOUT CONTRAST TECHNIQUE: Contiguous axial images were obtained from the base of the skull through the vertex without intravenous contrast. RADIATION DOSE REDUCTION: This exam was performed according to the departmental dose-optimization program which includes automated exposure control, adjustment of the mA and/or kV according to patient size and/or use of iterative reconstruction technique. COMPARISON:  None. FINDINGS: Brain: Mild atrophy pattern and white matter microvascular ischemic changes. No acute intracranial hemorrhage, new mass lesion, new infarction, midline shift, herniation, hydrocephalus, or extra-axial fluid  collection. No cerebellar abnormality. Vascular: Intracranial atherosclerosis. No hyperdense vessel. Skull: Normal. Negative for fracture or focal lesion. Sinuses/Orbits: No acute finding. Other: None. IMPRESSION: 1. Mild atrophy and white matter microvascular ischemic changes. 2. No acute intracranial abnormality by noncontrast CT. Electronically Signed   By: Judie Petit.  Shick M.D.   On: 09/07/2021 12:45   CT ABDOMEN PELVIS W CONTRAST  Result Date: 09/07/2021 CLINICAL DATA:  Abdominal pain EXAM: CT ABDOMEN AND PELVIS WITH CONTRAST TECHNIQUE: Multidetector CT imaging of the abdomen and pelvis was performed using the standard protocol following bolus administration of intravenous contrast. RADIATION DOSE REDUCTION: This exam was performed according to the departmental dose-optimization program which includes automated exposure control, adjustment of the mA and/or kV according to patient size and/or use of iterative reconstruction technique. CONTRAST:  54mL OMNIPAQUE IOHEXOL 300 MG/ML  SOLN COMPARISON:  None. FINDINGS: Lower chest: Increased interstitial markings in the periphery of both lower lung fields may suggest scarring. Hepatobiliary: No focal abnormality is seen in the liver. Gallbladder is distended. There is mild diffuse wall thickening in the gallbladder. There is no fluid around the gallbladder. Distal common bile duct in the head of the pancreas measures 8 mm in diameter. Pancreas: There is atrophy.  No focal abnormality is seen. Spleen: Unremarkable. Adrenals/Urinary Tract: Adrenals are unremarkable. There is no hydronephrosis. There are scattered calcifications in the right renal artery branches. No definite renal or ureteral stones are seen. Urinary bladder is not distended. There is mild diffuse wall thickening in the bladder which may be due to incomplete distention or cystitis. Stomach/Bowel: Stomach is unremarkable. There is mild dilation of proximal small bowel loops. There is mild diffuse wall  thickening in the distal small bowel loops. Appendix is not dilated. There is diffuse wall thickening in the colon. Liquid stool is seen in the the lumen of rectosigmoid. Vascular/Lymphatic: There are scattered atherosclerotic plaques and calcifications. There is ectasia of infrarenal abdominal aorta measuring 3.4 x 3.3 cm. There is no demonstrable retroperitoneal hematoma.  There are coarse calcifications in the major branches of aorta. There is possible high-grade stenosis in right common iliac artery Reproductive: Uterus is not seen. Other: There is no pneumoperitoneum. Minimal amount of free fluid is seen in the pelvis, possibly due to inflammation of distal small bowel loops and colon. Musculoskeletal: Degenerative changes are noted in the lumbar spine with disc space narrowing, bony spurs, facet hypertrophy and encroachment of neural foramina at multiple levels, more so at L3-L4 L4-L5 and L5-S1 levels. IMPRESSION: There is diffuse wall thickening in colon. There is diffuse wall thickening in the distal small bowel loops. Findings suggest inflammatory or infectious enterocolitis. There is no evidence of intestinal obstruction or pneumoperitoneum. There is no hydronephrosis. Gallbladder is distended with wall thickening. This may be due to chronic cholecystitis. If there are focal symptoms in the right upper quadrant, gallbladder sonogram may be considered. 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 Other findings as described in the body of the report. Electronically Signed   By: Ernie Avena M.D.   On: 09/07/2021 12:56   DG Chest Port 1 View  Result Date: 09/08/2021 CLINICAL DATA:  Chest pain EXAM: PORTABLE CHEST 1 VIEW COMPARISON:  09/07/2021 FINDINGS: Heart is normal size. Aortic atherosclerosis. Interstitial coarsening throughout the lungs again noted, unchanged. Continued opacity at the left base could reflect chronic  interstitial changes or developing infiltrate. No effusions. No real change since prior study. IMPRESSION: Stable chronic interstitial coarsening. Slight increased density at the left base could also be chronic interstitial changes although early infiltrate cannot be excluded. Electronically Signed   By: Charlett Nose M.D.   On: 09/08/2021 03:46   DG Chest Portable 1 View  Result Date: 09/07/2021 CLINICAL DATA:  Altered mental status.  Weakness. EXAM: PORTABLE CHEST 1 VIEW COMPARISON:  None. FINDINGS: 1130 hours. Lungs are hyperexpanded. Interstitial markings are diffusely coarsened with chronic features. Subtle disease at the left base may reflect chronic interstitial changes although subtle pneumonia not excluded. The cardiopericardial silhouette is within normal limits for size. The visualized bony structures of the thorax are unremarkable. Telemetry leads overlie the chest. IMPRESSION: 1. Hyperexpansion with chronic interstitial coarsening. 2. Subtle opacity at the left base may reflect chronic interstitial changes although subtle pneumonia not excluded. Electronically Signed   By: Kennith Center M.D.   On: 09/07/2021 11:45   US THYROID  Result Date: 09/08/2021 CLINICAL DATA:  Hyperthyroidism. EXAM: THYROID ULTRASOUND TECHNIQUE: Ultrasound examination of the thyroid gland and adjacent soft tissues was performed. COMPARISON:  None. FINDINGS: Parenchymal Echotexture: Mildly heterogenous Isthmus: 0.3 cm Right lobe: 3.7 x 1.3 x 1.3 cm Left lobe: 3.6 x 1.1 x 1.2 cm _________________________________________________________ Estimated total number of nodules >/= 1 cm: 0 Number of spongiform nodules >/=  2 cm not described below (TR1): 0 Number of mixed cystic and solid nodules >/= 1.5 cm not described below (TR2): 0 _________________________________________________________ Few small hypoechoic structures in the right thyroid lobe are nonspecific. No significant or suspicious thyroid nodules. Small amount of  heterogeneous plaque in the right carotid artery. Evidence for echogenic plaque in left carotid artery. IMPRESSION: 1. Thyroid tissue is mildly heterogeneous. No suspicious thyroid nodules. Reportedly, this was a technically challenging examination due to patient altered mental status. 2. Carotid artery atherosclerotic disease. The above is in keeping with the ACR TI-RADS recommendations - J Am Coll Radiol 2017;14:587-595. Electronically Signed   By: Richarda Overlie M.D.   On: 09/08/2021 15:45  US Abdomen Limited RUQ (LIVER/GB)  Result Date: 09/07/2021 CLINICAL DATA:  Right upper quadrant abdominal pain. EXAM: ULTRASOUND ABDOMEN LIMITED RIGHT UPPER QUADRANT COMPARISON:  Abdominopelvic CT same date. FINDINGS: Gallbladder: Borderline gallbladder wall thickening to 3-4 mm. The gallbladder is mildly distended, measuring up to 8.7 cm in length. No evidence of cholelithiasis, pericholecystic fluid or sonographic Murphy sign. Common bile duct: Diameter: 8 mm. The common bile duct tapers distally. No evidence of choledocholithiasis or intrahepatic biliary dilatation. Liver: No focal lesion identified. Within normal limits in parenchymal echogenicity. Portal vein is patent on color Doppler imaging with normal direction of blood flow towards the liver. Other: None. IMPRESSION: 1. Nonspecific borderline gallbladder hydrops with borderline gallbladder wall thickening. No evidence of cholelithiasis or sonographic Murphy sign. 2. Mild extrahepatic biliary dilatation without evidence of choledocholithiasis. Electronically Signed   By: Carey BullocksWilliam  Veazey M.D.   On: 09/07/2021 14:07    Cardiac Studies   Echo ordered  Patient Profile     71 y.o. female with a hx of nicotine dependence, chronic pain syndome, dyslipidemia who is being seen 09/08/2021 for the evaluation of afib RVR   Assessment & Plan    Afib RVR - Afib RVR in the setting of UTI, hypokalemia, naseua/vomiting - 3/2 patient went into afib RVR, converted to NSR  with IV dilt bolus - She remains in SR, rates around 100bpm - CHADSVASC at least 2 female, HTN. Unsure if she is PO>>IV heparin for pharmacy.  - Having some trouble with PO meds, stop dilt and start Coreg - check TSH - keep K>4 and Mag >2 - continue telemetry   HTN - BP has been elevated - d/c dilt as above - started on metoprolol - Bps still elevated, switch BB to coreg   Hypokalemia - 2.6 on arrival.  - goal>4 - potassium today 3.2, continue IV K as needed   AMS UTI Enterocolitis - ?etiology UTI/infection - WBC up today, LA normal - abx per IM - CT head showed no acute process, microvascular ischemic changes - may need MRI of head  For questions or updates, please contact [facility] Please consult [email or web address] for contact info under        Signed, Jarad Barth David StallH Shanti Agresti, PA-C  09/09/2021, 8:18 AM

## 2021-09-09 NOTE — Progress Notes (Addendum)
Speech Language Pathology Treatment: Dysphagia  ?Patient Details ?Name: Joy Clark ?MRN: [id number] ?DOB: [birth year] ?Today's Date: 09/09/2021 ?Time: 1335-1415 ?SLP Time Calculation (min) (ACUTE ONLY): 40 min ? ?Assessment / Plan / Recommendation ?Clinical Impression ? Pt seen for ongoing toleration of po's; NSG reported pt continues to have regurgitation of Phlegm, gagging behaviors, and refusal of oral intake. Also noted pt's Labs indicating elevated Na, WBC trending upward, elevated BP, and other issues. Discussed this w/ NSG and Pharmacy. NSG reported Surgery following for "CT abdomen/pelvis positive for diffuse wall thickening of colon and distal small bowel consistent with inflammatory or infectious enterocolitis. Also signs of chronic vs acute cholecystitis.". ?NSG reported pt is continuing to exhibit signs of refusal of oral intake but has accepted few sips of thin liquids (via straw) w/ him w/ No overt clinical s/s of aspiration, coughing/choking, during intake.  ? ?Oral cavity further assessed today for more oral care -- debris/crud on tongue and stringy Phlegm noted, removed as best able at the same time trying not to create increased gagging behavior. MD and NSG updated and ORAL RINSE requested for pt.  ? ?With the few trials accepted during this session w/ Clinician, pt appears to present w/ adequate pharyngeal phase swallowing w/ No overt clinical s/s of aspiration; no decline in O2 sats nor coughing occurred w/ trials. Pt exhibited oral phase dysphagia in setting of Acute illness and Cognitive decline; possible distaste and/or dislike of the jello trial (even though she smiled and accepted it when presented) followed by oral holding and expectoration. This was similar behavior as noted at BSE yesterday when she did not want the trials, or did not want to swallow the trials. Suspect potential impact from the GI issues w/ increased Phlegm leading to gagging and expectoration; this too can have impact on  swallowing and oral intake in general. ?  ?Pt's risk for aspiration can be reduced when following general aspiration precautions. Verbal/tactile/visual cues and support during po tasks can help encourage and support oral intake. Pt required MOD+ assistance w/ initiation in taking po trials as well as w/ oral care, feeding of trials -- she did not reach out to hold the cup immediately.  ? ?Of Note: she was unable to manage clearing the Phlegm she expectorated and often let it hang from her mouth. Unsure of pt's Baseline Cognitive status. She seemed to easily gag each time this Clinician completed oral care and clearing of sticky Phlegm coating her teeth and oral cavity.   ?Pt was not overtly verbal but nodded her head. She did not immediately respond to direct questions. She required MAX support to sit fully upright in bed for po intake.  ?  ?D/t pt's overall GI and Cognitive presentation and her risk for aspiration, recommend initiation of a more Full Liquid diet w/ upgrade as tolerates w/ NSG supervision; general aspiration and REFLUX precautions; reduce Distractions during meals and engage pt during po's at meal for self-feeding. Support and encouragement w/ eating at meals as needed. Pills Crushed in Puree for safer swallowing.  ?Recommend an ORAL RINSE d/t oral cavity presentation. Recommend continued f/u w/ Surgery/GI w/ the continued issues w/ Phlegm, gagging, and refusal of po intake frequently. MD/NSG updated.  ? ?No further skilled ST services indicated at this time as no pharyngeal phase deficits have been identified during assessment; oral phase dislike of po's may be impacted by Regurgitation/Phlegm issues and Cognitive issues(refusal of po's) currently. ST services can be reconsulted if new needs arise during admit.  NSG agreed. Recommend Dietician f/u for support. Discussed w/ Pharmacy and NSG/MD.  ? ? ?  ?HPI HPI: Pt is a 71 y.o. female with medical history significant for nicotine dependence, chronic  pain syndrome, dyslipidemia who was brought into the ER by EMS for evaluation of mental status changes for about 4 days.  Patient is visiting her daughter here in [place] and has been here for about 4 weeks.  At baseline, she is usually awake, alert and oriented to person place and time.  Patient's daughter states that her symptoms started about 4 days ago with chills and confusion.  Since then she has had multiple episodes of emesis and diarrhea and is now incontinent of stool which is loose and has lots of mucus in it.  Patient's oral intake has been very poor over the last 4 days as well.  She is awake and oriented to person in the ED.   CXR: Stable chronic interstitial coarsening.     Slight increased density at the left base could also be chronic  interstitial changes although early infiltrate cannot be excluded.  HEAD CT: Mild atrophy and white matter microvascular ischemic changes.  2. No acute intracranial abnormality.  CT of Abd.: There is diffuse wall thickening in colon. There is diffuse wall  thickening in the distal small bowel loops. Findings suggest  inflammatory or infectious enterocolitis. ?  ?   ?SLP Plan ? Discharge SLP treatment due to (comment) (pt unable to adequate participate in po intake d/t Regurgitation or Refusal issues) ? ?  ?  ?Recommendations for follow up therapy are one component of a multi-disciplinary discharge planning process, led by the attending physician.  Recommendations may be updated based on patient status, additional functional criteria and insurance authorization. ?  ? ?Recommendations  ?Diet recommendations:  (upgrade w/ MD/NSG as pt tolerates; pt was on a Regular diet prior to admit per report) ?Liquids provided via: Straw (ok) ?Medication Administration: Crushed with puree (for safer swallowing) ?Supervision: Staff to assist with self feeding;Full supervision/cueing for compensatory strategies ?Compensations: Minimize environmental distractions;Slow rate;Small  sips/bites;Lingual sweep for clearance of pocketing;Follow solids with liquid ?Postural Changes and/or Swallow Maneuvers: Out of bed for meals;Upright 30-60 min after meal;Seated upright 90 degrees (REFLUX precautions)  ?   ?    ?   ? ? ? ? General recommendations:  (Dietician f/u) ?Oral Care Recommendations: Oral care QID;Oral care before and after PO;Staff/trained caregiver to provide oral care ?Follow Up Recommendations: Skilled nursing-short term rehab (<3 hours/day) (TBD) ?Assistance recommended at discharge: Frequent or constant Supervision/Assistance ?SLP Visit Diagnosis: Dysphagia, unspecified (R13.10) (suspect impact from Cognitive decline and Acute illness w/ Regurgitation and GI issues; refusal of po's currently) ?Plan: Discharge SLP treatment due to (comment) (pt unable to adequate participate in po intake d/t Regurgitation or Refusal issues) ? ? ? ? ?  ?  ? ? ? ? ?Jerilynn Som, MS, CCC-SLP ?Speech Language Pathologist ?Rehab Services; [facility] ?[phone] (ascom) ?Macklin Jacquin ? ?09/09/2021, 2:44 PM ?

## 2021-09-09 NOTE — Progress Notes (Signed)
*  PRELIMINARY RESULTS* ?Echocardiogram ?2D Echocardiogram has been performed. ? ?Joy Clark ?09/09/2021, 11:08 AM ?

## 2021-09-09 NOTE — Consult Note (Signed)
ANTICOAGULATION CONSULT NOTE ? ?Pharmacy Consult for Heparin ?Indication: atrial fibrillation ? ?Not on File ? ?Patient Measurements: ?Height: 5\' 10"  (177.8 cm) ?Weight: 63.5 kg (140 lb) ?IBW/kg (Calculated) : 68.5 ?Heparin Dosing Weight: 63.5 kg ? ?Vital Signs: ?Temp: 99.3 ?F (37.4 ?C) (03/03 0305) ?Temp Source: Axillary (03/03 0305) ?BP: 194/86 (03/03 0405) ?Pulse Rate: 107 (03/03 0305) ? ?Labs: ?Recent Labs  ?  09/07/21 ?1109 09/07/21 ?1418 09/08/21 ?0336 09/08/21 ?1259 09/08/21 ?1900 09/09/21 ?11/09/21  ?HGB 16.2*  16.3*  --  14.8  --   --  17.1*  ?HCT 47.1*  47.7*  --  43.6  --   --  50.6*  ?PLT 263  259  --  264  --   --  319  ?APTT  --   --   --  25*  --   --   ?LABPROT  --   --   --  16.4*  --   --   ?INR  --   --   --  1.3*  --   --   ?HEPARINUNFRC  --   --   --   --  0.29* 0.43  ?CREATININE 0.92  --  0.89  --   --   --   ?TROPONINIHS 45* 48*  --   --   --   --   ? ? ? ?Estimated Creatinine Clearance: 58.1 mL/min (by C-G formula based on SCr of 0.89 mg/dL). ? ? ?Medical History: ?Past Medical History:  ?Diagnosis Date  ? Fibromyalgia   ? ? ?Medications:  ?No DOAC PTA. Was on enoxaparin for DVT ppx.  ? ?Assessment: ?71 year old female presented with emesis and diarrhea. Pt was found to be in afib and was given ditl 15 IV x 1 and converted back to NSR. Cards following and want to initiate heparin and dilt gtt. CHADSVASc 2, low risk of clotting. No know cardiac history. Pt currently has issues with PO medications. Speech to follow.  ? ?Date Time HL Rate/comment ?3/2 1900 0.29 850 un/hr, subthera ?3/3       0554   0.43     Therapeutic  ? ?Goal of Therapy:  ?Heparin level 0.3-0.7 units/ml ?Monitor platelets by anticoagulation protocol: Yes ?  ?Plan:  ?3/3:  HL @ 0554 = 0.43, therapeutic X 1 ?Will continue pt on current rate and recheck HL on 3/3 @ 1400.  ? ?Abdirahman Chittum D, PharmD ?09/09/2021,7:06 AM ? ? ?

## 2021-09-09 NOTE — TOC CM/SW Note (Signed)
?  Transition of Care (TOC) Screening Note ? ? ?Patient Details  ?Name: Joy Clark ?Date of Birth: 1951/03/09 ? ? ?Transition of Care (TOC) CM/SW Contact:    ?Margarito Liner, LCSW ?Phone Number: ?09/09/2021, 2:43 PM ? ? ? ?Transition of Care Department [facility]) has reviewed patient and no TOC needs have been identified at this time. We will continue to monitor patient advancement through interdisciplinary progression rounds. If new patient transition needs arise, please place a TOC consult. ? ? ?

## 2021-09-09 NOTE — Progress Notes (Signed)
Subjective:  ?CC: ?Joy Clark is a 71 y.o. female  Hospital stay day 2,   gallbladder hydrops ? ?HPI: ?No acute changes reported. ? ?ROS:  ?Unable to obtain secondary to mentation ? ? ?Objective:  ? ?Temp:  [97.7 ?F (36.5 ?C)-99.3 ?F (37.4 ?C)] 97.9 ?F (36.6 ?C) (03/03 1100) ?Pulse Rate:  [93-107] 97 (03/03 1100) ?Resp:  [13-33] 29 (03/03 1100) ?BP: (166-199)/(75-91) 166/89 (03/03 1100) ?SpO2:  [96 %-99 %] 96 % (03/03 1100)     Height: 5\' 10"  (177.8 cm) Weight: 63.5 kg BMI (Calculated): 20.09  ? ?Intake/Output this shift:  ? ?Intake/Output Summary (Last 24 hours) at 09/09/2021 1303 ?Last data filed at 09/09/2021 0427 ?Gross per 24 hour  ?Intake 323.19 ml  ?Output 25 ml  ?Net 298.19 ml  ? ? ?Constitutional :  no distress, slowed mentation, and shakes head to yes no questions sometime  ?Respiratory:  clear to auscultation bilaterally  ?Cardiovascular:  regular rate and rhythm  ?Gastrointestinal: soft, non-tender; bowel sounds normal; no masses,  no organomegaly.   ?Skin: Cool and moist.   ?   ?   ?  ?LABS:  ?CMP Latest Ref Rng & Units 09/09/2021 09/08/2021 09/08/2021  ?Glucose 70 - 99 mg/dL 11/08/2021) - 332(R)  ?BUN 8 - 23 mg/dL 518(A) - 41(Y)  ?Creatinine 0.44 - 1.00 mg/dL 60(Y - 3.01  ?Sodium 135 - 145 mmol/L 154(H) - 149(H)  ?Potassium 3.5 - 5.1 mmol/L 3.2(L) 3.4(L) 2.6(LL)  ?Chloride 98 - 111 mmol/L 117(H) - 111  ?CO2 22 - 32 mmol/L 27 - 22  ?Calcium 8.9 - 10.3 mg/dL 9.2 - 8.5(L)  ?Total Protein 6.5 - 8.1 g/dL 7.6 - -  ?Total Bilirubin 0.3 - 1.2 mg/dL 0.8 - -  ?Alkaline Phos 38 - 126 U/L 113 - -  ?AST 15 - 41 U/L 21 - -  ?ALT 0 - 44 U/L 18 - -  ? ?CBC Latest Ref Rng & Units 09/09/2021 09/08/2021 09/07/2021  ?WBC 4.0 - 10.5 K/uL 15.5(H) 13.4(H) 13.7(H)  ?Hemoglobin 12.0 - 15.0 g/dL 17.1(H) 14.8 16.2(H)  ?Hematocrit 36.0 - 46.0 % 50.6(H) 43.6 47.1(H)  ?Platelets 150 - 400 K/uL 319 264 263  ? ? ?RADS: ?N/a ?Assessment:  ? ?Gallbladder hydrops noted. Abdominal exam continues to be overall unremarkable.  Current clinical issues likely not  related to gallbladder.  Continue care per hospitalist.  Discussed with daughter at bedside and confirmed reports noted in chart regarding admission. Explained no surgical intervention necessary at this time. She verbalized understanding.  No need for outpt f/u. ? ?Surgery to sign off.  Please call with questions or concerns. ? ?labs/images/medications/previous chart entries reviewed personally and relevant changes/updates noted above. ? ? ?

## 2021-09-09 NOTE — Progress Notes (Signed)
?PROGRESS NOTE ? ?Joy Clark    DOB: [birth year], 71 y.o.  ?WVP:[id number]  ?  Code Status: Full Code   ?DOA: 09/07/2021   LOS: 2  ? ?Brief hospital course  ?Joy Clark is a 71 y.o. female with a PMH significant for nicotine dependence, chronic pain syndrome, HLD. ?They presented from home to the ED on 09/07/2021 with AMS x 4 days. Began having confusion and chills about 4 days ago, per daughter followed by multiple episodes of emesis, diarrhea and poor PO intake.  ?In the ED, it was found that they had hypokalemia, lactic acidosis, elevated troponins, pyuria. ?CT head showed mild atrophy and white matter microvascular ischemic changes. Nothing acute was identified. No previous to compare to. ?CT abdomen/pelvis positive for diffuse wall thickening of colon and distal small bowel consistent with inflammatory or infectious enterocolitis. Also signs of chronic vs acute cholecystitis. RUQ [place] positive for borderline gallbladder hydrops and positive sonographic murphy sign. Surgery was consulted.  ?They were treated with supportive care including electrolyte repletion as well as antibiotics for UTI. ?  ?Patient was admitted to medicine service for further workup and management of AMS as outlined in detail below. ? ?Overnight 3/1, patient had an event of Afib RVR which is presumably a new diagnosis for her. She was started on diltiazem bolus and drip which controlled her rate and she remained hemodynamically stable. Cardiology was consulted to evaluate.  ? ?09/09/21 -stable, no acute events ? ?Assessment & Plan  ?Principal Problem: ?  Acute metabolic encephalopathy ?Active Problems: ?  Enterocolitis ?  UTI (urinary tract infection) ?  Hypokalemia ?  Abnormal CT scan, gallbladder ?  Altered mental status ?  Colitis ?  Dehydration ?  Gallbladder hydrops ?  Hyperthyroidism ?  Nausea vomiting and diarrhea ?  RUQ pain ?  Atrial fibrillation with RVR (HCC) ? ?Acute metabolic encephalopathy on dementia-  improved since admission  with correction of underlying metabolic disorders but still not at baseline. Underlying conditions treated as below- ?- PT/OT ? ?New onset Afib RVR  elevated BP- converted to NSR with metoprolol and diltiazem.  ?- cardiology following, appreciate recs ? - initiated heparin gtt, attempt eliquis when able to take PO reliably ? - dilt drip titrate off ? - switched metoprolol to carvedilol ?- f/u echo ? ?TSH low- signs of hyperthyroid. 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 Thyroid US did not show suspicious nodules. T4 elevated.  ?- initiate BB ?- methimazole start at low dose ?- will need outpatient endocrinology referral ?- f/u T3 ? ?Gallbladder disease- ?- general surgery following, appreciate recs. Does not recommend urgent surgical intervention ?- analgesia PRN and nutritional support ?  ?Hypokalemia  hypernatremia (present on admission) K+ 2.7>2.6>3.2, Na+  149>154 despite repletion. Except in relation to GI losses which have improved but still having poor PO intake.  ?Mg++ wnl. ?Secondary to GI losses from nausea, vomiting and diarrhea ?- Supplement potassium ?- BMP in afternoon and am ?- LR with K+ supplement > D5 with K+ ?  ?UTI (urinary tract infection)- (present on admission) ?Patient has significant pyuria ?- continue Rocephin ? - follow UCx ?  ?Enterocolitis- (present on admission) cdiff and GI pathogen panel both negative. Likely viral vs inflammatory ?- supportive care and treat underlying conditions ?- dc flagyl ? ?Body mass index is 20.09 kg/m?. ? ?VTE ppx: heparin gtt ? ?Diet:  ?   ?Diet  ? Diet NPO time specified  ? ?Subjective 09/09/21   ? ?Pt reports she is doing ok.  She denies any complaints. Daughter has several questions which were answered at bedside.  ?  ?Objective  ? ?Vitals:  ? 09/09/21 1100 09/09/21 1300 09/09/21 1400 09/09/21 1405  ?BP: (!) 166/89 (!) 188/96 (!) 190/103 (!) 182/96  ?Pulse: 97   86  ?Resp: (!) 29  20   ?Temp: 97.9 ?F (36.6 ?C)     ?TempSrc: Axillary     ?SpO2: 96%      ?Weight:      ?Height:      ? ? ?Intake/Output Summary (Last 24 hours) at 09/09/2021 1435 ?Last data filed at 09/09/2021 0427 ?Gross per 24 hour  ?Intake 323.19 ml  ?Output 25 ml  ?Net 298.19 ml  ? ?Filed Weights  ? 09/07/21 1106  ?Weight: 63.5 kg  ?  ? ?Physical Exam:  ?General: awake, alert, NAD ?HEENT: atraumatic, clear conjunctiva, anicteric sclera, MMM, hearing grossly normal ?Respiratory: normal respiratory effort. ?Cardiovascular: normal S1/S2, RRR, no JVD, murmurs, quick capillary refill  ?Gastrointestinal: soft, NT, ND ?Nervous: A&O self. no gross focal neurologic deficits, normal speech ?Extremities: moves all equally, no edema, normal tone ?Skin: dry, intact, normal temperature, normal color. No rashes, lesions or ulcers on exposed skin ? ?Labs   ?I have personally reviewed the following labs and imaging studies ?CBC ?   ?Component Value Date/Time  ? WBC 15.5 (H) 09/09/2021 0554  ? RBC 5.39 (H) 09/09/2021 0554  ? HGB 17.1 (H) 09/09/2021 0554  ? HCT 50.6 (H) 09/09/2021 0554  ? PLT 319 09/09/2021 0554  ? MCV 93.9 09/09/2021 0554  ? MCH 31.7 09/09/2021 0554  ? MCHC 33.8 09/09/2021 0554  ? RDW 13.0 09/09/2021 0554  ? LYMPHSABS 0.7 09/07/2021 1109  ? MONOABS 0.5 09/07/2021 1109  ? EOSABS 0.0 09/07/2021 1109  ? BASOSABS 0.0 09/07/2021 1109  ? ?BMP Latest Ref Rng & Units 09/09/2021 09/08/2021 09/08/2021  ?Glucose 70 - 99 mg/dL 094(M) - 768(G)  ?BUN 8 - 23 mg/dL 88(P) - 10(R)  ?Creatinine 0.44 - 1.00 mg/dL 1.59 - 4.58  ?Sodium 135 - 145 mmol/L 154(H) - 149(H)  ?Potassium 3.5 - 5.1 mmol/L 3.2(L) 3.4(L) 2.6(LL)  ?Chloride 98 - 111 mmol/L 117(H) - 111  ?CO2 22 - 32 mmol/L 27 - 22  ?Calcium 8.9 - 10.3 mg/dL 9.2 - 8.5(L)  ? ? ?DG Chest Port 1 View ? ?Result Date: 09/08/2021 ?CLINICAL DATA:  Chest pain EXAM: PORTABLE CHEST 1 VIEW COMPARISON:  09/07/2021 FINDINGS: Heart is normal size. Aortic atherosclerosis. Interstitial coarsening throughout the lungs again noted, unchanged. Continued opacity at the left base could reflect  chronic interstitial changes or developing infiltrate. No effusions. No real change since prior study. IMPRESSION: Stable chronic interstitial coarsening. Slight increased density at the left base could also be chronic interstitial changes although early infiltrate cannot be excluded. Electronically Signed   By: Charlett Nose M.D.   On: 09/08/2021 03:46  ? ?US THYROID ? ?Result Date: 09/08/2021 ?CLINICAL DATA:  Hyperthyroidism. EXAM: THYROID ULTRASOUND TECHNIQUE: Ultrasound examination of the thyroid gland and adjacent soft tissues was performed. COMPARISON:  None. FINDINGS: Parenchymal Echotexture: Mildly heterogenous Isthmus: 0.3 cm Right lobe: 3.7 x 1.3 x 1.3 cm Left lobe: 3.6 x 1.1 x 1.2 cm _________________________________________________________ Estimated total number of nodules >/= 1 cm: 0 Number of spongiform nodules >/=  2 cm not described below (TR1): 0 Number of mixed cystic and solid nodules >/= 1.5 cm not described below (TR2): 0 _________________________________________________________ Few small hypoechoic structures in the right thyroid lobe are nonspecific. No significant or  suspicious thyroid nodules. Small amount of heterogeneous plaque in the right carotid artery. Evidence for echogenic plaque in left carotid artery. IMPRESSION: 1. Thyroid tissue is mildly heterogeneous. No suspicious thyroid nodules. Reportedly, this was a technically challenging examination due to patient altered mental status. 2. Carotid artery atherosclerotic disease. The above is in keeping with the ACR TI-RADS recommendations - J Am Coll Radiol 2017;14:587-595. Electronically Signed   By: Richarda Overlie M.D.   On: 09/08/2021 15:45   ? ?Disposition Plan & Communication  ?Patient status: Inpatient  ?Admitted From: Home ?Planned disposition location: Relative's home ?Anticipated discharge date: 3/6 pending further medical evaluation ? ?Family Communication: daughter at bedside ?  ?Author: ?Leeroy Bock, DO ?[facility] ?09/09/2021, 2:35 PM  ? ?Available by Epic secure chat 7AM-7PM. ?If 7PM-7AM, please contact night-coverage.  ?[facility] contact information found on [email or web address].  ?

## 2021-09-09 NOTE — Care Management Important Message (Signed)
Important Message ? ?Patient Details  ?Name: Joy Clark ?MRN: [id number] ?Date of Birth: [birth year] ? ? ?Medicare Important Message Given:  N/A - LOS <3 / Initial given by admissions ? ? ? ? ?[name] ?09/09/2021, 9:52 AM ?

## 2021-09-09 NOTE — Progress Notes (Signed)
Attending Note ?Patient seen and examined, agree with detailed note above,   ?Patient presentation and plan discussed on rounds.   ? ?EKG lab work, chest x-ray, echocardiogram reviewed independently by myself ? ?Alert, nonverbal, will wave, nod yes ?No distress ?Blood pressure elevated 160/90, maintaining normal sinus rhythm on telemetry ?Difficulty taking her oral medications, has not received last 3 doses of diltiazem every 6.  ?By report had some GI upset, spit up last night ? ?On examination : Very thin, alert  no JVD, lungs clear to auscultation bilaterally, heart sounds regular normal S1-S2 no murmurs appreciated, abdomen soft nontender no significant lower extremity edema.  Musculoskeletal exam with good range of motion, neurologic exam able to move extremities, nonverbal ? ?Lab work reviewed sodium 154 potassium 3.2 creatinine 0.87 BUN 31 ? ?A/P: ?Atrial fibrillation with RVR ?In the setting of UTI, diarrhea, bronchitis/COPD exacerbation,Gallbladder hydrops ?-We will attempt to hold diltiazem infusion ?Was not taking diltiazem every 6 ?Started on carvedilol 25 twice daily ?On heparin infusion.  Once mentating better taking pills better could transition to Eliquis ?  ?Encephalopathy ?Secondary to UTI, diarrhea, bronchitis/COPD exacerbation ?Has dysphagia per swallow eval ?Cognitive decline, baseline unclear ?Noted to be gagging, expectoration on swallow study yesterday ?High risk for aspiration, had trouble overnight ?Requiring max support to sit up ?  ?Hypokalemia ?On IV potassium ? ?Hypernatremia ?Half-normal saline ?  ?Urinary tract infection/enterocolitis/altered mental status ?Management per medicine service ?Antibiotics ? ? ?Case discussed with nursing, discussed strategies for pills, management of her blood pressure and atrial fibrillation ?Greater than 50% was spent in counseling and coordination of care with patient ?Total encounter time 50 minutes or more ? ? ?Signed: ?Dossie Arbour  M.D., Ph.D. ?[facility] ? ?

## 2021-09-09 NOTE — Progress Notes (Incomplete)
Cross Cover' Nurse reports systolic pressure above [phone].  Patient on BID coreg at home. She is currently NPO and has not received.  Heart rates in 90's. Pressures throughout day with systolics above 0000000

## 2021-09-09 NOTE — Consult Note (Signed)
ANTICOAGULATION CONSULT NOTE ? ?Pharmacy Consult for Heparin ?Indication: atrial fibrillation ? ?Not on File ? ?Patient Measurements: ?Height: 5\' 10"  (177.8 cm) ?Weight: 63.5 kg (140 lb) ?IBW/kg (Calculated) : 68.5 ?Heparin Dosing Weight: 63.5 kg ? ?Vital Signs: ?Temp: 97.9 ?F (36.6 ?C) (03/03 1100) ?Temp Source: Axillary (03/03 1100) ?BP: 182/96 (03/03 1405) ?Pulse Rate: 86 (03/03 1405) ? ?Labs: ?Recent Labs  ?  09/07/21 ?1109 09/07/21 ?1418 09/08/21 ?0336 09/08/21 ?1259 09/08/21 ?1900 09/09/21 ?11/09/21 09/09/21 ?1423  ?HGB 16.2*  16.3*  --  14.8  --   --  17.1*  --   ?HCT 47.1*  47.7*  --  43.6  --   --  50.6*  --   ?PLT 263  259  --  264  --   --  319  --   ?APTT  --   --   --  77*  --   --   --   ?LABPROT  --   --   --  16.4*  --   --   --   ?INR  --   --   --  1.3*  --   --   --   ?HEPARINUNFRC  --   --   --   --  0.29* 0.43 0.46  ?CREATININE 0.92  --  0.89  --   --  0.87  --   ?TROPONINIHS 45* 48*  --   --   --   --   --   ? ? ? ?Estimated Creatinine Clearance: 59.5 mL/min (by C-G formula based on SCr of 0.87 mg/dL). ? ? ?Medical History: ?Past Medical History:  ?Diagnosis Date  ? Fibromyalgia   ? ? ?Medications:  ?No DOAC PTA. Was on enoxaparin for DVT ppx.  ? ?Assessment: ?71 year old female presented with emesis and diarrhea. Pt was found to be in afib and was given ditl 15 IV x 1 and converted back to NSR. Cards following and want to initiate heparin and dilt gtt. CHADSVASc 2, low risk of clotting. No know cardiac history. Pt currently has issues with PO medications. Speech to follow.  ? ?Date Time HL Rate/comment ?3/2 1900 0.29 850 un/hr, subthera ?3/3       0554   0.43     Therapeutic  ?3/3 1423 0.46 Therapeutic.  ? ? ?Goal of Therapy:  ?Heparin level 0.3-0.7 units/ml ?Monitor platelets by anticoagulation protocol: Yes ?  ?Plan:  ?Heparin level is therapeutic. Will continue heparin at 1000 units/hr. Will recheck HL and CBC with AM labs.  ? ? ?62, PharmD ?09/09/2021,3:55 PM ? ? ?

## 2021-09-10 ENCOUNTER — Inpatient Hospital Stay: Payer: Medicare Other

## 2021-09-10 DIAGNOSIS — R932 Abnormal findings on diagnostic imaging of liver and biliary tract: Secondary | ICD-10-CM | POA: Diagnosis not present

## 2021-09-10 DIAGNOSIS — G9341 Metabolic encephalopathy: Secondary | ICD-10-CM | POA: Diagnosis not present

## 2021-09-10 DIAGNOSIS — E860 Dehydration: Secondary | ICD-10-CM | POA: Diagnosis not present

## 2021-09-10 DIAGNOSIS — N342 Other urethritis: Secondary | ICD-10-CM

## 2021-09-10 DIAGNOSIS — R4182 Altered mental status, unspecified: Secondary | ICD-10-CM | POA: Diagnosis not present

## 2021-09-10 LAB — CBC
HCT: 50.9 % — ABNORMAL HIGH (ref 36.0–46.0)
Hemoglobin: 17.2 g/dL — ABNORMAL HIGH (ref 12.0–15.0)
MCH: 31.9 pg (ref 26.0–34.0)
MCHC: 33.8 g/dL (ref 30.0–36.0)
MCV: 94.4 fL (ref 80.0–100.0)
Platelets: 315 10*3/uL (ref 150–400)
RBC: 5.39 MIL/uL — ABNORMAL HIGH (ref 3.87–5.11)
RDW: 13.2 % (ref 11.5–15.5)
WBC: 18.3 10*3/uL — ABNORMAL HIGH (ref 4.0–10.5)
nRBC: 0 % (ref 0.0–0.2)

## 2021-09-10 LAB — BASIC METABOLIC PANEL
Anion gap: 9 (ref 5–15)
BUN: 33 mg/dL — ABNORMAL HIGH (ref 8–23)
CO2: 26 mmol/L (ref 22–32)
Calcium: 9 mg/dL (ref 8.9–10.3)
Chloride: 117 mmol/L — ABNORMAL HIGH (ref 98–111)
Creatinine, Ser: 0.88 mg/dL (ref 0.44–1.00)
GFR, Estimated: >60 mL/min (ref >60)
Glucose, Bld: 149 mg/dL — ABNORMAL HIGH (ref 70–99)
Potassium: 3.8 mmol/L (ref 3.5–5.1)
Sodium: 152 mmol/L — ABNORMAL HIGH (ref 135–145)

## 2021-09-10 LAB — MAGNESIUM: Magnesium: 2 mg/dL (ref 1.7–2.4)

## 2021-09-10 LAB — HEPARIN LEVEL (UNFRACTIONATED): Heparin Unfractionated: 0.58 IU/mL (ref 0.30–0.70)

## 2021-09-10 LAB — T3: T3, Total: 79 ng/dL (ref 71–180)

## 2021-09-10 IMAGING — DX DG CHEST 1V PORT
1 series · 1 of 1 positions shown · non-contrast
Comparison: [DATE]

CLINICAL DATA: Possible aspiration into airway. History of nicotine
dependence, chronic pain syndrome and dyslipidemia.

EXAM:
PORTABLE CHEST 1 VIEW

[chest ap]
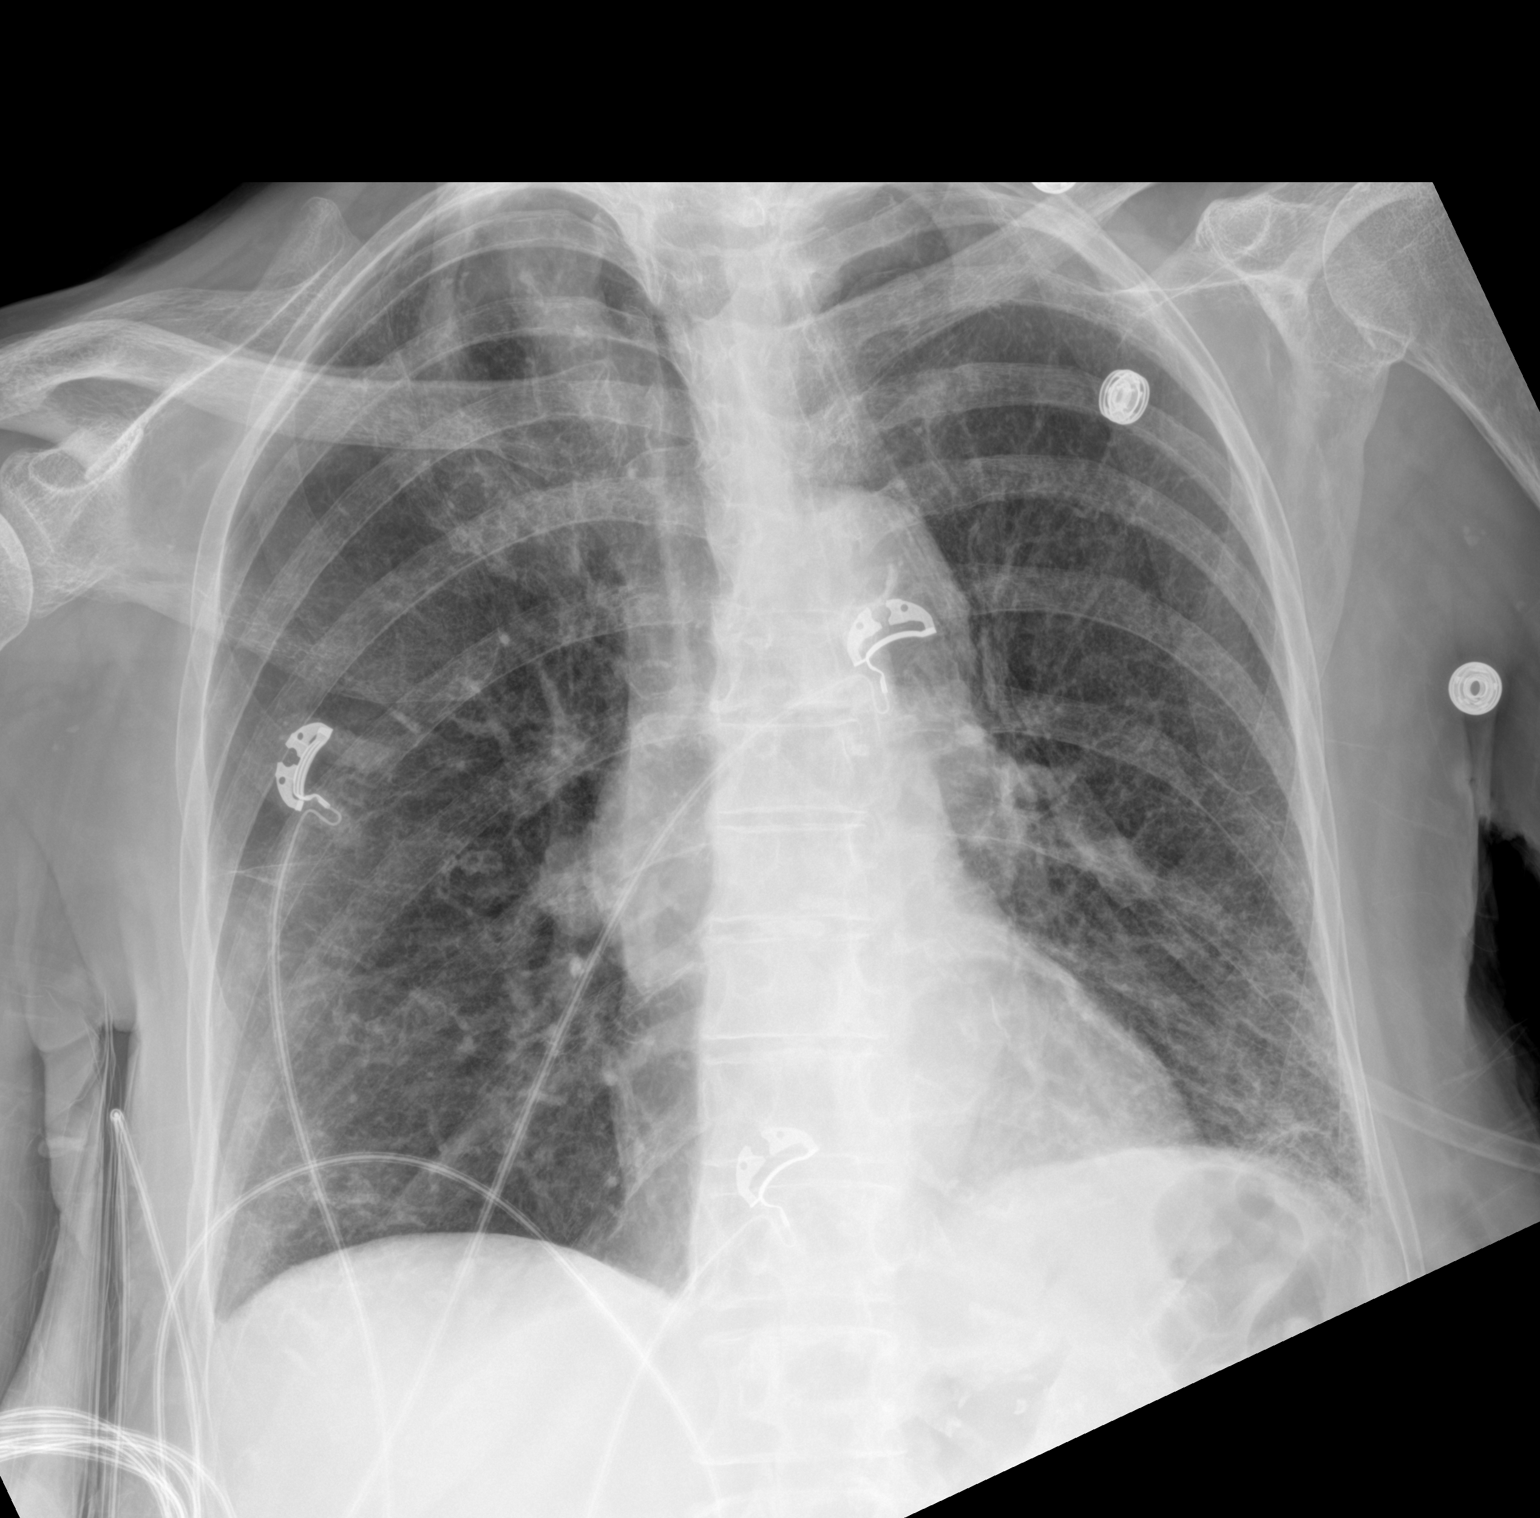

[1 of 1 positions shown; findings below may reference images not displayed]

FINDINGS: Heart size is normal. No signs of pleural effusion or edema.
Bilateral lower lung zone predominant reticular interstitial
opacities are again identified, left greater than right. No
superimposed interstitial edema or airspace consolidation.
IMPRESSION: 1. No acute abnormality.
2. Lower lung zone predominant reticular interstitial opacities are
favored to represent sequelae of mild interstitial lung disease.

## 2021-09-10 MED ORDER — POTASSIUM CL IN DEXTROSE 5% 20 MEQ/L IV SOLN
20.0000 meq | INTRAVENOUS | Status: DC
Start: 2021-09-10 — End: 2021-09-11
  Administered 2021-09-11: 20 meq via INTRAVENOUS
  Filled 2021-09-10 (×3): qty 1000

## 2021-09-10 MED ORDER — OXYCODONE-ACETAMINOPHEN 5-325 MG PO TABS
1.0000 | ORAL_TABLET | Freq: Four times a day (QID) | ORAL | Status: DC | PRN
  Administered 2021-09-10 – 2021-09-18 (×11): 1 via ORAL
  Filled 2021-09-10 (×11): qty 1

## 2021-09-10 NOTE — Evaluation (Signed)
Occupational Therapy Evaluation ?Patient Details ?Name: Joy Clark ?MRN: [id number] ?DOB: 05-16-1951 ?Today's Date: 09/10/2021 ? ? ?History of Present Illness Joy Clark is a 71 y.o. female with medical history significant for nicotine dependence, chronic pain syndrome, dyslipidemia who was brought into the ER by EMS for evaluation of mental status changes for about 4 days.  ? ?Clinical Impression ?  ?Joy Clark was seen for OT evaluation this date. Prior to hospital admission, pt was Independent for all mobility and I/ADLs, uses electric w/c for airport travel. Pt lives in [place], [place] with family available 24/7. Pt presents to acute OT demonstrating impaired ADL performance and functional mobility 2/2 poor command following, decreased activity tolerance and functional strength/ROM/balance deficits. Upon arrival pt noted to be soiled, family educated on technique for bed level toileting if plan to return home with 24/7 care.  ? ?Pt currently requires MAX A toileting at bed level. Does not squeeze hands in response to commands or lift against gravity but when placed on bed rail maintains grasp ~1 min. TOTAL A sup<>sit, initial CGA with BUE placed in support decreasing to MIN A with fatigue. MAX A don/doff gown seated EOB. Follows commands to turn head in sitting, appears to have increased alertness. Pt would benefit from skilled OT to address noted impairments and functional limitations (see below for any additional details). Upon hospital discharge, recommend STR to maximize pt safety and return to PLOF.  ? ?SUPINE: BP 164/93, HR 81 ?SITTING: BP 112/97, HR 82  ? ?Recommendations for follow up therapy are one component of a multi-disciplinary discharge planning process, led by the attending physician.  Recommendations may be updated based on patient status, additional functional criteria and insurance authorization.  ? ?Follow Up Recommendations ? Skilled nursing-short term rehab (<3 hours/day)  ?  ?Assistance  Recommended at Discharge Frequent or constant Supervision/Assistance  ?Patient can return home with the following Two people to help with walking and/or transfers;Two people to help with bathing/dressing/bathroom;Help with stairs or ramp for entrance ? ?  ?Functional Status Assessment ? Patient has had a recent decline in their functional status and demonstrates the ability to make significant improvements in function in a reasonable and predictable amount of time.  ?Equipment Recommendations ? BSC/3in1  ?  ?Recommendations for Other Services   ? ? ?  ?Precautions / Restrictions Precautions ?Precautions: Fall ?Restrictions ?Weight Bearing Restrictions: No  ? ?  ? ?Mobility Bed Mobility ?Overal bed mobility: Needs Assistance ?Bed Mobility: Rolling, Supine to Sit, Sit to Supine ?Rolling: Max assist ?  ?Supine to sit: Total assist, +2 for physical assistance ?Sit to supine: Total assist, +2 for physical assistance ?  ?  ?  ? ?Transfers ?  ?  ?  ?  ?  ?  ?  ?  ?  ?General transfer comment: not assessed 2/2 lethargy and high BP ?  ? ?  ?Balance Overall balance assessment: Needs assistance ?Sitting-balance support: Bilateral upper extremity supported, Feet unsupported ?Sitting balance-[name]: Fair ?Sitting balance - Comments: CGA with BUE placed in support decreasing to MIN A with fatigue ?Postural control: Posterior lean ?  ?  ?  ?  ?  ?  ?  ?  ?  ?  ?  ?  ?  ?  ?   ? ?ADL either performed or assessed with clinical judgement  ? ?ADL Overall ADL's : Needs assistance/impaired ?  ?  ?  ?  ?  ?  ?  ?  ?  ?  ?  ?  ?  ?  ?  ?  ?  ?  ?  ?  General ADL Comments: MAX A toileting at bed level. MAX A don/doff gown seated EOB.  ? ? ? ? ?Pertinent Vitals/Pain Pain Assessment ?Pain Assessment: Faces ?Faces Pain Scale: Hurts a little bit ?Pain Location: during pericare - unclear if mobility or periarea ?Pain Descriptors / Indicators: Grimacing, Moaning ?Pain Intervention(s): Limited activity within patient's tolerance, Repositioned   ? ? ? ?Hand Dominance   ?  ?Extremity/Trunk Assessment Upper Extremity Assessment ?Upper Extremity Assessment: LUE deficits/detail;RUE deficits/detail ?RUE Deficits / Details: does not squeeze hands in response to commands or lift against gravity. When placed on bed rail maintains grasp ?LUE Deficits / Details: does not squeeze hands in response to commands or lift against gravity. When placed on bed rail maintains grasp ?  ?Lower Extremity Assessment ?Lower Extremity Assessment: Generalized weakness ?  ?  ?  ?Communication Communication ?Communication: Expressive difficulties ?  ?Cognition Arousal/Alertness: Lethargic ?Behavior During Therapy: Flat affect ?Overall Cognitive Status: Difficult to assess ?  ?  ?  ?  ?  ?  ?  ?  ?  ?  ?  ?  ?  ?  ?  ?  ?General Comments: pt does not speak during session, minimally shakes head and smiles in response to family at end of session. Eyes open with HOB elevated/sitting. Follows commands to turn head ?  ?  ?General Comments  SUPINE: BP 164/93, HR 81. SITTING: BP 112/97, HR 82 ? ?  ?   ?   ? ? ?Home Living Family/patient expects to be discharged to:: Private residence ?Living Arrangements: Children ?Available Help at Discharge: Family;Available 24 hours/day ?Type of Home: House ?Home Access: Stairs to enter ?Entrance Stairs-Number of Steps: 2 ?Entrance Stairs-Rails: None ?Home Layout: One level ?  ?  ?Bathroom Shower/Tub: Walk-in shower ?  ?Bathroom Toilet: Standard ?  ?  ?Home Equipment: Agricultural consultant (2 wheels);Shower seat;Grab bars - tub/shower;Wheelchair - Engineer, technical sales - power;Hospital bed (family reports $5000 electric bed) ?  ?  ?  ? ?  ?Prior Functioning/Environment Prior Level of Function : Independent/Modified Independent;Driving ?  ?  ?  ?  ?  ?  ?Mobility Comments: no AD ?  ?  ? ?  ?  ?OT Problem List: Decreased strength;Decreased range of motion;Decreased activity tolerance;Impaired balance (sitting and/or standing);Decreased safety awareness ?  ?   ?OT  Treatment/Interventions: Self-care/ADL training;Therapeutic exercise;Energy conservation;DME and/or AE instruction;Therapeutic activities;Patient/family education;Balance training  ?  ?OT Goals(Current goals can be found in the care plan section) Acute Rehab OT Goals ?Patient Stated Goal: to go home ?OT Goal Formulation: With family ?Time For Goal Achievement: 09/24/21 ?Potential to Achieve Goals: Fair ?ADL Goals ?Pt Will Perform Grooming: sitting;with min guard assist ?Pt Will Perform Lower Body Dressing: with min assist;sitting/lateral leans ?Pt Will Transfer to Toilet: with supervision (rolling at bed level) ?Pt Will Perform Toileting - Clothing Manipulation and hygiene: with min assist;sitting/lateral leans  ?OT Frequency: Min 3X/week ?  ? ?Co-evaluation   ?  ?  ?  ?  ? ?  ?AM-PAC OT "6 Clicks" Daily Activity     ?Outcome Measure Help from another person eating meals?: A Lot ?Help from another person taking care of personal grooming?: A Lot ?Help from another person toileting, which includes using toliet, bedpan, or urinal?: A Lot ?Help from another person bathing (including washing, rinsing, drying)?: A Lot ?Help from another person to put on and taking off regular upper body clothing?: A Lot ?Help from another person to put on and taking off regular lower body clothing?:  A Lot ?6 Click Score: 12 ?  ?End of Session Nurse Communication: Mobility status ? ?Activity Tolerance: Patient tolerated treatment well ?Patient left: in bed;with call bell/phone within reach;with bed alarm set;with nursing/sitter in room;with family/visitor present ? ?OT Visit Diagnosis: Other abnormalities of gait and mobility (R26.89);Muscle weakness (generalized) (M62.81)  ?              ?Time: 4008-6761 ?OT Time Calculation (min): 33 min ?Charges:  OT General Charges ?$OT Visit: 1 Visit ?OT Evaluation ?$OT Eval Moderate Complexity: 1 Mod ?OT Treatments ?$Self Care/Home Management : 23-37 mins ? ?Kathie Dike, M.S. OTR/L  ?09/10/21, 12:19  PM  ?ascom [phone] ? ?

## 2021-09-10 NOTE — Progress Notes (Signed)
PROGRESS NOTE  Joy Clark    DOB: [birth year], 71 y.o.  EGB:[id number]    Code Status: Full Code   DOA: 09/07/2021   LOS: 3   Brief hospital course  Joy Clark is a 71 y.o. female with a PMH significant for nicotine dependence, chronic pain syndrome, HLD. They presented from home to the ED on 09/07/2021 with AMS x 4 days. Began having confusion and chills about 4 days ago, per daughter followed by multiple episodes of emesis, diarrhea and poor PO intake.  In the ED, it was found that they had hypokalemia, lactic acidosis, elevated troponins, pyuria. CT head showed mild atrophy and white matter microvascular ischemic changes. Nothing acute was identified. No previous to compare to. CT abdomen/pelvis positive for diffuse wall thickening of colon and distal small bowel consistent with inflammatory or infectious enterocolitis. Also signs of chronic vs acute cholecystitis. RUQ [place] positive for borderline gallbladder hydrops and positive sonographic murphy sign. Surgery was consulted.  They were treated with supportive care including electrolyte repletion as well as antibiotics for UTI.   Patient was admitted to medicine service for further workup and management of AMS as outlined in detail below.  Overnight 3/1, patient had an event of Afib RVR which is presumably a new diagnosis for her. She was started on diltiazem bolus and drip which controlled her rate and she remained hemodynamically stable. Cardiology was consulted to evaluate.   09/10/21 -stable, no acute events  Assessment & Plan  Principal Problem:   Acute metabolic encephalopathy Active Problems:   Enterocolitis   UTI (urinary tract infection)   Hypokalemia   Abnormal CT scan, gallbladder   Altered mental status   Colitis   Dehydration   Gallbladder hydrops   Hyperthyroidism   Nausea vomiting and diarrhea   RUQ pain   Atrial fibrillation with RVR (HCC)   Hypernatremia  Acute metabolic encephalopathy on dementia-  improved  since admission with correction of underlying metabolic disorders but still not at baseline. No acute findings on head imaging on admission. Underlying conditions treated as below- - PT/OT/SLP  New onset Afib RVR   elevated BP- converted to NSR with metoprolol and diltiazem. Echo showing G1DD, EF 60-65%, sinus rhythm - cardiology following, appreciate recs  - initiated heparin gtt, attempt eliquis when able to take PO reliably  - dilt drip titrate off  - switched metoprolol to carvedilol  TSH low- signs of hyperthyroid. 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 Thyroid US did not show suspicious nodules. T4 elevated, T3 normal. Family describes positive history of known thyroid disorder in the past but was not on treatment. - continue BB - methimazole start at low dose  Potential for opioid withdrawal- family endorses that patient takes large amount of percocet scheduled for LE pain.  - restart percocet and ativan PRN for withdrawal symptoms  Gallbladder disease- - general surgery signed off. Does not recommend urgent surgical intervention - analgesia PRN and nutritional support   Hypokalemia   hypernatremia (present on admission) K+ 2.7>2.6>3.2, Na+  149>154 despite repletion. Except in relation to GI losses which have improved but still having poor PO intake.  Mg++ wnl. Secondary to GI losses from nausea, vomiting and diarrhea - Supplement potassium - BMP in afternoon and am - LR with K+ supplement > D5 with K+. Discontinue when taking PO consistently   UTI (urinary tract infection)- (present on admission) Patient has significant pyuria - continue Rocephin  - follow UCx   Enterocolitis- (present on admission) cdiff and GI  pathogen panel both negative. Likely viral vs inflammatory - supportive care and treat underlying conditions - dc flagyl  Body mass index is 20.09 kg/m.  VTE ppx: heparin gtt  Diet:     Diet   Diet NPO time specified Except for: Sips with Meds    Subjective 09/10/21    Pt reports nothing today as she is not verbal. She does open eyes and can follow simple commands   Objective   Vitals:   09/10/21 0000 09/10/21 0100 09/10/21 0300 09/10/21 0740  BP: (!) 164/89 (!) 158/89 (!) 180/97 (!) 192/106  Pulse:   79 90  Resp: (!) 29 (!) 30 (!) 21 (!) 30  Temp:   97.9 F (36.6 C) 98.7 F (37.1 C)  TempSrc:   Oral Axillary  SpO2:   96% 96%  Weight:      Height:        Intake/Output Summary (Last 24 hours) at 09/10/2021 0848 Last data filed at 09/10/2021 0700 Gross per 24 hour  Intake 1002.9 ml  Output 3 ml  Net 999.9 ml    Filed Weights   09/07/21 1106  Weight: 63.5 kg     Physical Exam:  General: awake, alert, NAD HEENT: atraumatic, clear conjunctiva, anicteric sclera, MMM, hearing grossly normal Respiratory: normal respiratory effort. Cardiovascular: normal S1/S2, RRR, no JVD, murmurs, quick capillary refill  Gastrointestinal: soft, NT, ND Nervous: Alert and can follow simple commands. Non-verbal Extremities: moves all equally, no edema, normal tone Skin: dry, intact, normal temperature, normal color. No rashes, lesions or ulcers on exposed skin  Labs   I have personally reviewed the following labs and imaging studies CBC    Component Value Date/Time   WBC 18.3 (H) 09/10/2021 0546   RBC 5.39 (H) 09/10/2021 0546   HGB 17.2 (H) 09/10/2021 0546   HCT 50.9 (H) 09/10/2021 0546   PLT 315 09/10/2021 0546   MCV 94.4 09/10/2021 0546   MCH 31.9 09/10/2021 0546   MCHC 33.8 09/10/2021 0546   RDW 13.2 09/10/2021 0546   LYMPHSABS 0.7 09/07/2021 1109   MONOABS 0.5 09/07/2021 1109   EOSABS 0.0 09/07/2021 1109   BASOSABS 0.0 09/07/2021 1109   BMP Latest Ref Rng & Units 09/09/2021 09/08/2021 09/08/2021  Glucose 70 - 99 mg/dL 161(W126(H) - 960(A102(H)  BUN 8 - 23 mg/dL 54(U31(H) - 98(J25(H)  Creatinine 0.44 - 1.00 mg/dL 1.910.87 - 4.780.89  Sodium 295135 - 145 mmol/L 154(H) - 149(H)  Potassium 3.5 - 5.1 mmol/L 3.2(L) 3.4(L) 2.6(LL)  Chloride 98 - 111  mmol/L 117(H) - 111  CO2 22 - 32 mmol/L 27 - 22  Calcium 8.9 - 10.3 mg/dL 9.2 - 8.5(L)    ECHOCARDIOGRAM COMPLETE  Result Date: 09/09/2021    ECHOCARDIOGRAM REPORT   Patient Name:   Joy Clark Date of Exam: 09/09/2021 Medical Rec #:  [id number]   Height:       70.0 in Accession #:    [id number]  Weight:       140.0 lb Date of Birth:  [birth year]  BSA:          1.794 m Patient Age:    70 years    BP:           182/90 mmHg Patient Gender: F           HR:           95 bpm. Exam Location:  [facility] Procedure: 2D Echo, Color Doppler and Cardiac Doppler Indications:     I48.91 Atrial  fibrillation  History:         Patient has no prior history of Echocardiogram examinations.                  Risk Factors:Former Smoker.  Sonographer:     Humphrey Rolls Referring Phys:  [id number] Antonieta Iba Diagnosing Phys: Julien Nordmann MD IMPRESSIONS  1. Left ventricular ejection fraction, by estimation, is 60 to 65%. The left ventricle has normal function. The left ventricle has no regional wall motion abnormalities. Left ventricular diastolic parameters are consistent with Grade I diastolic dysfunction (impaired relaxation).  2. Right ventricular systolic function is normal. The right ventricular size is normal.  3. The mitral valve is normal in structure. No evidence of mitral valve regurgitation. No evidence of mitral stenosis.  4. The aortic valve was not well visualized. Aortic valve regurgitation is not visualized. No aortic stenosis is present.  5. The inferior vena cava is normal in size with greater than 50% respiratory variability, suggesting right atrial pressure of 3 mmHg.  6. Rhythm is normal sinus FINDINGS  Left Ventricle: Left ventricular ejection fraction, by estimation, is 60 to 65%. The left ventricle has normal function. The left ventricle has no regional wall motion abnormalities. The left ventricular internal cavity size was normal in size. There is  no left ventricular hypertrophy. Left ventricular diastolic parameters  are consistent with Grade I diastolic dysfunction (impaired relaxation). Right Ventricle: The right ventricular size is normal. No increase in right ventricular wall thickness. Right ventricular systolic function is normal. Left Atrium: Left atrial size was normal in size. Right Atrium: Right atrial size was normal in size. Pericardium: There is no evidence of pericardial effusion. Mitral Valve: The mitral valve is normal in structure. No evidence of mitral valve regurgitation. No evidence of mitral valve stenosis. MV peak gradient, 2.9 mmHg. The mean mitral valve gradient is 1.0 mmHg. Tricuspid Valve: The tricuspid valve is normal in structure. Tricuspid valve regurgitation is not demonstrated. No evidence of tricuspid stenosis. Aortic Valve: The aortic valve was not well visualized. Aortic valve regurgitation is not visualized. No aortic stenosis is present. Aortic valve mean gradient measures 3.0 mmHg. Aortic valve peak gradient measures 5.7 mmHg. Aortic valve area, by VTI measures 2.55 cm. Pulmonic Valve: The pulmonic valve was normal in structure. Pulmonic valve regurgitation is not visualized. No evidence of pulmonic stenosis. Aorta: The aortic root is normal in size and structure. Venous: The inferior vena cava is normal in size with greater than 50% respiratory variability, suggesting right atrial pressure of 3 mmHg. IAS/Shunts: No atrial level shunt detected by color flow Doppler.  LEFT VENTRICLE PLAX 2D LVIDd:         3.46 cm   Diastology LVIDs:         2.34 cm   LV e' medial:    6.09 cm/s LV PW:         0.97 cm   LV E/e' medial:  10.1 LV IVS:        0.66 cm   LV e' lateral:   8.16 cm/s LVOT diam:     1.90 cm   LV E/e' lateral: 7.6 LV SV:         54 LV SV Index:   30 LVOT Area:     2.84 cm  LEFT ATRIUM             Index LA diam:        2.40 cm 1.34 cm/m LA Vol (A2C):  39.0 ml 21.74 ml/m LA Vol (A4C):   16.4 ml 9.14 ml/m LA Biplane Vol: 26.3 ml 14.66 ml/m  AORTIC VALVE                    PULMONIC  VALVE AV Area (Vmax):    2.84 cm     PV Vmax:       0.89 m/s AV Area (Vmean):   2.74 cm     PV Vmean:      60.100 cm/s AV Area (VTI):     2.55 cm     PV VTI:        0.138 m AV Vmax:           119.00 cm/s  PV Peak grad:  3.2 mmHg AV Vmean:          81.300 cm/s  PV Mean grad:  2.0 mmHg AV VTI:            0.212 m AV Peak Grad:      5.7 mmHg AV Mean Grad:      3.0 mmHg LVOT Vmax:         119.00 cm/s LVOT Vmean:        78.700 cm/s LVOT VTI:          0.191 m LVOT/AV VTI ratio: 0.90  AORTA Ao Root diam: 3.00 cm MITRAL VALVE MV Area (PHT): 4.46 cm    SHUNTS MV Area VTI:   3.67 cm    Systemic VTI:  0.19 m MV Peak grad:  2.9 mmHg    Systemic Diam: 1.90 cm MV Mean grad:  1.0 mmHg MV Vmax:       0.85 m/s MV Vmean:      52.8 cm/s MV Decel Time: 170 msec MV E velocity: 61.70 cm/s MV A velocity: 84.00 cm/s MV E/A ratio:  0.73 Julien Nordmann MD Electronically signed by Julien Nordmann MD Signature Date/Time: 09/09/2021/2:37:25 PM    Final    US THYROID  Result Date: 09/08/2021 CLINICAL DATA:  Hyperthyroidism. EXAM: THYROID ULTRASOUND TECHNIQUE: Ultrasound examination of the thyroid gland and adjacent soft tissues was performed. COMPARISON:  None. FINDINGS: Parenchymal Echotexture: Mildly heterogenous Isthmus: 0.3 cm Right lobe: 3.7 x 1.3 x 1.3 cm Left lobe: 3.6 x 1.1 x 1.2 cm _________________________________________________________ Estimated total number of nodules >/= 1 cm: 0 Number of spongiform nodules >/=  2 cm not described below (TR1): 0 Number of mixed cystic and solid nodules >/= 1.5 cm not described below (TR2): 0 _________________________________________________________ Few small hypoechoic structures in the right thyroid lobe are nonspecific. No significant or suspicious thyroid nodules. Small amount of heterogeneous plaque in the right carotid artery. Evidence for echogenic plaque in left carotid artery. IMPRESSION: 1. Thyroid tissue is mildly heterogeneous. No suspicious thyroid nodules. Reportedly, this was a  technically challenging examination due to patient altered mental status. 2. Carotid artery atherosclerotic disease. The above is in keeping with the ACR TI-RADS recommendations - J Am Coll Radiol 2017;14:587-595. Electronically Signed   By: Richarda Overlie M.D.   On: 09/08/2021 15:45    Disposition Plan & Communication  Patient status: Inpatient  Admitted From: Home Planned disposition location: SNF in [place] Anticipated discharge date: 3/8 pending further medical evaluation  Family Communication: daughters and granddaughter at bedside   Author: Leeroy Bock, DO [facility] 09/10/2021, 8:48 AM   Available by Epic secure chat 7AM-7PM. If 7PM-7AM, please contact night-coverage.  [facility] contact information found on [email or web address].

## 2021-09-10 NOTE — Progress Notes (Signed)
Requested for IV BP medication for the patient from [place] NP since her blood pressure is crazy high. She changed her diet to NPO with sips with med so she could take her Coreg. Will administer and see if it will bring  the BP down. ?

## 2021-09-10 NOTE — Evaluation (Signed)
Physical Therapy Evaluation Patient Details Name: Joy Clark MRN: [id number] DOB: [birth year] Today's Date: 09/10/2021  History of Present Illness  Joy Clark is a 71 y.o. female with medical history significant for nicotine dependence, chronic pain syndrome, dyslipidemia who was brought into the ER by EMS for evaluation of mental status changes for about 4 days.   Clinical Impression  Pt is a 71 year old F admitted to hospital on 09/07/21 for acute metabolic encephalopathy. PLOF obtained from family at bedside as pt presents with AMS, lethargy, and is mostly non-verbal at this time. At baseline, pt was Ind with ADL's, driving, community ambulation without AD; able to perform IADL's if needed, but family typically assists. Pt presents with generalized weakness, lethargy, impaired processing, increased O2 dependence from baseline, limited verbal communication, decreased activity tolerance, resulting in impaired functional mobility from baseline. Due to deficits, pt required max assist for bil rolling in supine for pericare after incontinent episode, as well as total assist for supine scooting towards HOB. Further mobility limited secondary to HTN during session (despite being pre-medicated 1hr prior to session) with BP at 179/110 mmHg. Pt left in supine, with bed in chair position and HOB elevated. BP retaken and was at 140/86 mmHg; unknown if due to medication or orthostatic hypotension. Deficits limit the pt's ability to safely and independently perform ADL's, transfer, and ambulate. Pt will benefit from acute skilled PT services to address deficits for return to baseline function. At this time, PT recommends SNF at DC to address deficits and improve overall safety with functional mobility. Family agreeable, but considering taking pt home pending progress while hospitalized. Will continue to assess DME needs; pt may benefit from 3in1.  Of note, family interested in taking pt back to [facility], regardless  of DC home vs. Rehab, as this is where they live and where pt is from. Inquired about whether or not pt would be able to attend SNF rehab in [facility]. Explained to family that from PT perspective, main concern would be safety/ability for transfers to/from [facility], car, and/or airplane seat. Explained that this answer would most likely have to come from CM/SW. They verbalized understanding. Care team notified.        Recommendations for follow up therapy are one component of a multi-disciplinary discharge planning process, led by the attending physician.  Recommendations may be updated based on patient status, additional functional criteria and insurance authorization.  Follow Up Recommendations Skilled nursing-short term rehab (<3 hours/day)    Assistance Recommended at Discharge Intermittent Supervision/Assistance  Patient can return home with the following  Two people to help with walking and/or transfers;Two people to help with bathing/dressing/bathroom;Direct supervision/assist for medications management;Assistance with feeding;Assistance with cooking/housework;Assist for transportation;Help with stairs or ramp for entrance    Equipment Recommendations  (defer to post acute; may benefit from 3in1)     Functional Status Assessment Patient has had a recent decline in their functional status and demonstrates the ability to make significant improvements in function in a reasonable and predictable amount of time.     Precautions / Restrictions Precautions Precautions: Fall Restrictions Weight Bearing Restrictions: No      Mobility  Bed Mobility Overal bed mobility: Needs Assistance Bed Mobility: Rolling Rolling: Max assist         General bed mobility comments: max A for bil rolling in bed for pericare after incontinence episode; able to maintain sidelying Ind    Transfers  General transfer comment: not assessed 2/2 lethargy and high BP       Balance  Overall balance assessment:  (not formally assessed due to limitations in mobility, secondary to HTN)                                           Pertinent Vitals/Pain Pain Assessment Pain Assessment: Faces Faces Pain Scale: Hurts a little bit Pain Location: during pericare - unclear if mobility or periarea Pain Descriptors / Indicators: Grimacing, Moaning Pain Intervention(s): Limited activity within patient's tolerance, Monitored during session, Repositioned    Home Living Family/patient expects to be discharged to:: Private residence Living Arrangements: Children Available Help at Discharge: Family;Available 24 hours/day Type of Home: House Home Access: Stairs to enter Entrance Stairs-Rails: None Entrance Stairs-Number of Steps: 2   Home Layout: One level Home Equipment: Agricultural consultant (2 wheels);Shower seat;Grab bars - tub/shower;Wheelchair - Engineer, technical sales - power;Hospital bed (family reports $5000 electric bed)      Prior Function Prior Level of Function : Independent/Modified Independent;Driving;History of Falls (last six months)             Mobility Comments: no AD, uses electric WC for mobility in airport       Hand Dominance   Dominant Hand: Right    Extremity/Trunk Assessment   Upper Extremity Assessment Upper Extremity Assessment: Defer to OT evaluation RUE Deficits / Details: does not squeeze hands in response to commands or lift against gravity. When placed on bed rail maintains grasp LUE Deficits / Details: does not squeeze hands in response to commands or lift against gravity. When placed on bed rail maintains grasp    Lower Extremity Assessment Lower Extremity Assessment: Generalized weakness;Difficult to assess due to impaired cognition (unable to move LE against gravity; unknown if due to AMS/difficulty following commands or significant weakness)       Communication   Communication: Expressive difficulties  Cognition  Arousal/Alertness: Lethargic Behavior During Therapy: Flat affect Overall Cognitive Status: Difficult to assess                                 General Comments: pt does not speak during session, minimally shakes head and smiles in response to family at end of session. Eyes open with HOB elevated/sitting. Follows minimal commands during session        General Comments General comments (skin integrity, edema, etc.): SUPINE: BP 164/93, HR 81. SITTING: BP 112/97, HR 82    Exercises Other Exercises Other Exercises: bil rolling in bed for pericare after incontinent episode; further mobility limited secondary to HTN. Family educated re: PT role/POC, DC recommdations, safety with mobility, reason for limitations in mobility, BP, session tomorrow, improving mentation/delirium. They verbalized understanding.   Assessment/Plan    PT Assessment Patient needs continued PT services  PT Problem List Decreased strength;Decreased activity tolerance;Decreased balance;Decreased mobility;Decreased cognition;Decreased safety awareness;Cardiopulmonary status limiting activity       PT Treatment Interventions DME instruction;Gait training;Stair training;Functional mobility training;Therapeutic activities;Therapeutic exercise;Balance training;Neuromuscular re-education    PT Goals (Current goals can be found in the Care Plan section)  Acute Rehab PT Goals Patient Stated Goal: "go home" PT Goal Formulation: With family Time For Goal Achievement: 09/24/21 Potential to Achieve Goals: Fair    Frequency Min 2X/week        AM-PAC PT "6 Clicks"  Mobility  Outcome Measure Help needed turning from your back to your side while in a flat bed without using bedrails?: A Lot Help needed moving from lying on your back to sitting on the side of a flat bed without using bedrails?: Total Help needed moving to and from a bed to a chair (including a wheelchair)?: Total Help needed standing up from a  chair using your arms (e.g., wheelchair or bedside chair)?: Total Help needed to walk in hospital room?: Total Help needed climbing 3-5 steps with a railing? : Total 6 Click Score: 7    End of Session Equipment Utilized During Treatment: Gait belt;Oxygen (2L) Activity Tolerance: Patient limited by fatigue;Treatment limited secondary to medical complications (Comment) Patient left: in bed;with call bell/phone within reach;with bed alarm set;with family/visitor present Nurse Communication: Mobility status (vitals, pericare, pain) PT Visit Diagnosis: Muscle weakness (generalized) (M62.81);Difficulty in walking, not elsewhere classified (R26.2);Other symptoms and signs involving the nervous system (W23.762)    Time: 8315-1761 PT Time Calculation (min) (ACUTE ONLY): 33 min   Charges:   PT Evaluation $PT Eval Moderate Complexity: 1 Mod PT Treatments $Therapeutic Activity: 8-22 mins       Vira Blanco, PT, DPT 2:01 PM,09/10/21

## 2021-09-10 NOTE — Progress Notes (Signed)
Progress Note  Patient Name: Joy Clark Date of Encounter: 09/10/2021  [facility] Cardiologist: Mariah Milling  Subjective   Has converted back to sinus rhythm.  Remains somewhat confused.  Inpatient Medications    Scheduled Meds:  atorvastatin  40 mg Oral Daily   carvedilol  25 mg Oral BID WC   lisinopril  20 mg Oral Daily   methimazole  5 mg Oral Daily   pantoprazole (PROTONIX) IV  40 mg Intravenous Q24H   spironolactone  50 mg Oral Daily   Continuous Infusions:  cefTRIAXone (ROCEPHIN)  IV 2 g (09/09/21 1158)   dextrose 5 % with KCl 20 mEq / L 20 mEq (09/09/21 1615)   heparin 1,000 Units/hr (09/09/21 1204)   PRN Meds: albuterol, ondansetron **OR** ondansetron (ZOFRAN) IV   Vital Signs    Vitals:   09/10/21 0300 09/10/21 0740 09/10/21 1000 09/10/21 1100  BP: (!) 180/97 (!) 192/106 (!) 152/93 (!) 162/93  Pulse: 79 90    Resp: (!) 21 (!) 30 (!) 30 (!) 31  Temp: 97.9 F (36.6 C) 98.7 F (37.1 C)    TempSrc: Oral Axillary    SpO2: 96% 96%    Weight:      Height:        Intake/Output Summary (Last 24 hours) at 09/10/2021 1149 Last data filed at 09/10/2021 0700 Gross per 24 hour  Intake 1002.9 ml  Output 2 ml  Net 1000.9 ml   Last 3 Weights 09/07/2021  Weight (lbs) 140 lb  Weight (kg) 63.504 kg      Telemetry    Sinus rhythm- Personally Reviewed  ECG    None new- Personally Reviewed  Physical Exam   GEN: No acute distress.   Neck: No JVD Cardiac: RRR, no murmurs, rubs, or gallops.  Respiratory: Clear to auscultation bilaterally. GI: Soft, nontender, non-distended  MS: No edema; No deformity. Neuro:  Nonfocal  Psych: Normal affect   Labs    High Sensitivity Troponin:   Recent Labs  Lab 09/07/21 1109 09/07/21 1418  TROPONINIHS 45* 48*     Chemistry Recent Labs  Lab 09/07/21 1109 09/08/21 0336 09/08/21 0338 09/08/21 1259 09/09/21 0554 09/10/21 0846  NA 144 149*  --   --  154* 152*  K 2.7* 2.6*  --  3.4* 3.2* 3.8  CL 104 111  --   --   117* 117*  CO2 23 22  --   --  27 26  GLUCOSE 117* 102*  --   --  126* 149*  BUN 24* 25*  --   --  31* 33*  CREATININE 0.92 0.89  --   --  0.87 0.88  CALCIUM 9.4 8.5*  --   --  9.2 9.0  MG 1.9  --  1.9  --   --  2.0  PROT 7.6  --   --   --  7.6  --   ALBUMIN 3.2*  --   --   --  3.2*  --   AST 36  --   --   --  21  --   ALT 25  --   --   --  18  --   ALKPHOS 144*  --   --   --  113  --   BILITOT 0.5  --   --   --  0.8  --   GFRNONAA >60 >60  --   --  >60 >60  ANIONGAP 17* 16*  --   --  10 9    Lipids No results for input(s): CHOL, TRIG, HDL, LABVLDL, LDLCALC, CHOLHDL in the last 168 hours.  Hematology Recent Labs  Lab 09/08/21 0336 09/09/21 0554 09/10/21 0546  WBC 13.4* 15.5* 18.3*  RBC 4.68 5.39* 5.39*  HGB 14.8 17.1* 17.2*  HCT 43.6 50.6* 50.9*  MCV 93.2 93.9 94.4  MCH 31.6 31.7 31.9  MCHC 33.9 33.8 33.8  RDW 12.6 13.0 13.2  PLT 264 319 315   Thyroid  Recent Labs  Lab 09/08/21 0336 09/08/21 1259  TSH  --  0.010*  FREET4 2.24*  --     BNPNo results for input(s): BNP, PROBNP in the last 168 hours.  DDimer  Recent Labs  Lab 09/08/21 [phone]  DDIMER 6.42*     Radiology    DG Chest Port 1 View  Result Date: 09/10/2021 CLINICAL DATA:  Possible aspiration into airway. History of nicotine dependence, chronic pain syndrome and dyslipidemia. EXAM: PORTABLE CHEST 1 VIEW COMPARISON:  09/08/2021 FINDINGS: Heart size is normal. No signs of pleural effusion or edema. Bilateral lower lung zone predominant reticular interstitial opacities are again identified, left greater than right. No superimposed interstitial edema or airspace consolidation. IMPRESSION: 1. No acute abnormality. 2. Lower lung zone predominant reticular interstitial opacities are favored to represent sequelae of mild interstitial lung disease. Electronically Signed   By: Signa Kell M.D.   On: 09/10/2021 09:13   ECHOCARDIOGRAM COMPLETE  Result Date: 09/09/2021    ECHOCARDIOGRAM REPORT   Patient Name:   Joy Clark Date of Exam: 09/09/2021 Medical Rec #:  [id number]   Height:       70.0 in Accession #:    [id number]  Weight:       140.0 lb Date of Birth:  1951/05/04  BSA:          1.794 m Patient Age:    70 years    BP:           182/90 mmHg Patient Gender: F           HR:           95 bpm. Exam Location:  [facility] Procedure: 2D Echo, Color Doppler and Cardiac Doppler Indications:     I48.91 Atrial fibrillation  History:         Patient has no prior history of Echocardiogram examinations.                  Risk Factors:Former Smoker.  Sonographer:     Humphrey Rolls Referring Phys:  [id number] Antonieta Iba Diagnosing Phys: Julien Nordmann MD IMPRESSIONS  1. Left ventricular ejection fraction, by estimation, is 60 to 65%. The left ventricle has normal function. The left ventricle has no regional wall motion abnormalities. Left ventricular diastolic parameters are consistent with Grade I diastolic dysfunction (impaired relaxation).  2. Right ventricular systolic function is normal. The right ventricular size is normal.  3. The mitral valve is normal in structure. No evidence of mitral valve regurgitation. No evidence of mitral stenosis.  4. The aortic valve was not well visualized. Aortic valve regurgitation is not visualized. No aortic stenosis is present.  5. The inferior vena cava is normal in size with greater than 50% respiratory variability, suggesting right atrial pressure of 3 mmHg.  6. Rhythm is normal sinus FINDINGS  Left Ventricle: Left ventricular ejection fraction, by estimation, is 60 to 65%. The left ventricle has normal function. The left ventricle has no regional wall motion abnormalities. The left ventricular internal  cavity size was normal in size. There is  no left ventricular hypertrophy. Left ventricular diastolic parameters are consistent with Grade I diastolic dysfunction (impaired relaxation). Right Ventricle: The right ventricular size is normal. No increase in right ventricular wall thickness. Right  ventricular systolic function is normal. Left Atrium: Left atrial size was normal in size. Right Atrium: Right atrial size was normal in size. Pericardium: There is no evidence of pericardial effusion. Mitral Valve: The mitral valve is normal in structure. No evidence of mitral valve regurgitation. No evidence of mitral valve stenosis. MV peak gradient, 2.9 mmHg. The mean mitral valve gradient is 1.0 mmHg. Tricuspid Valve: The tricuspid valve is normal in structure. Tricuspid valve regurgitation is not demonstrated. No evidence of tricuspid stenosis. Aortic Valve: The aortic valve was not well visualized. Aortic valve regurgitation is not visualized. No aortic stenosis is present. Aortic valve mean gradient measures 3.0 mmHg. Aortic valve peak gradient measures 5.7 mmHg. Aortic valve area, by VTI measures 2.55 cm. Pulmonic Valve: The pulmonic valve was normal in structure. Pulmonic valve regurgitation is not visualized. No evidence of pulmonic stenosis. Aorta: The aortic root is normal in size and structure. Venous: The inferior vena cava is normal in size with greater than 50% respiratory variability, suggesting right atrial pressure of 3 mmHg. IAS/Shunts: No atrial level shunt detected by color flow Doppler.  LEFT VENTRICLE PLAX 2D LVIDd:         3.46 cm   Diastology LVIDs:         2.34 cm   LV e' medial:    6.09 cm/s LV PW:         0.97 cm   LV E/e' medial:  10.1 LV IVS:        0.66 cm   LV e' lateral:   8.16 cm/s LVOT diam:     1.90 cm   LV E/e' lateral: 7.6 LV SV:         54 LV SV Index:   30 LVOT Area:     2.84 cm  LEFT ATRIUM             Index LA diam:        2.40 cm 1.34 cm/m LA Vol (A2C):   39.0 ml 21.74 ml/m LA Vol (A4C):   16.4 ml 9.14 ml/m LA Biplane Vol: 26.3 ml 14.66 ml/m  AORTIC VALVE                    PULMONIC VALVE AV Area (Vmax):    2.84 cm     PV Vmax:       0.89 m/s AV Area (Vmean):   2.74 cm     PV Vmean:      60.100 cm/s AV Area (VTI):     2.55 cm     PV VTI:        0.138 m AV Vmax:            119.00 cm/s  PV Peak grad:  3.2 mmHg AV Vmean:          81.300 cm/s  PV Mean grad:  2.0 mmHg AV VTI:            0.212 m AV Peak Grad:      5.7 mmHg AV Mean Grad:      3.0 mmHg LVOT Vmax:         119.00 cm/s LVOT Vmean:        78.700 cm/s LVOT VTI:  0.191 m LVOT/AV VTI ratio: 0.90  AORTA Ao Root diam: 3.00 cm MITRAL VALVE MV Area (PHT): 4.46 cm    SHUNTS MV Area VTI:   3.67 cm    Systemic VTI:  0.19 m MV Peak grad:  2.9 mmHg    Systemic Diam: 1.90 cm MV Mean grad:  1.0 mmHg MV Vmax:       0.85 m/s MV Vmean:      52.8 cm/s MV Decel Time: 170 msec MV E velocity: 61.70 cm/s MV A velocity: 84.00 cm/s MV E/A ratio:  0.73 Julien Nordmannimothy Gollan MD Electronically signed by Julien Nordmannimothy Gollan MD Signature Date/Time: 09/09/2021/2:37:25 PM    Final    US THYROID  Result Date: 09/08/2021 CLINICAL DATA:  Hyperthyroidism. EXAM: THYROID ULTRASOUND TECHNIQUE: Ultrasound examination of the thyroid gland and adjacent soft tissues was performed. COMPARISON:  None. FINDINGS: Parenchymal Echotexture: Mildly heterogenous Isthmus: 0.3 cm Right lobe: 3.7 x 1.3 x 1.3 cm Left lobe: 3.6 x 1.1 x 1.2 cm _________________________________________________________ Estimated total number of nodules >/= 1 cm: 0 Number of spongiform nodules >/=  2 cm not described below (TR1): 0 Number of mixed cystic and solid nodules >/= 1.5 cm not described below (TR2): 0 _________________________________________________________ Few small hypoechoic structures in the right thyroid lobe are nonspecific. No significant or suspicious thyroid nodules. Small amount of heterogeneous plaque in the right carotid artery. Evidence for echogenic plaque in left carotid artery. IMPRESSION: 1. Thyroid tissue is mildly heterogeneous. No suspicious thyroid nodules. Reportedly, this was a technically challenging examination due to patient altered mental status. 2. Carotid artery atherosclerotic disease. The above is in keeping with the ACR TI-RADS recommendations - J Am Coll  Radiol 2017;14:587-595. Electronically Signed   By: Richarda OverlieAdam  Henn M.D.   On: 09/08/2021 15:45    Cardiac Studies   TTE 09/09/21  1. Left ventricular ejection fraction, by estimation, is 60 to 65%. The  left ventricle has normal function. The left ventricle has no regional  wall motion abnormalities. Left ventricular diastolic parameters are  consistent with Grade I diastolic  dysfunction (impaired relaxation).   2. Right ventricular systolic function is normal. The right ventricular  size is normal.   3. The mitral valve is normal in structure. No evidence of mitral valve  regurgitation. No evidence of mitral stenosis.   4. The aortic valve was not well visualized. Aortic valve regurgitation  is not visualized. No aortic stenosis is present.   5. The inferior vena cava is normal in size with greater than 50%  respiratory variability, suggesting right atrial pressure of 3 mmHg.   6. Rhythm is normal sinus   Patient Profile     71 y.o. female with nicotine dependence, chronic pain, hyperlipidemia who presented to the hospital with acute encephalopathy and rapid atrial fibrillation.  Assessment & Plan    1.  Atrial fibrillation with rapid response: Has fortunately converted to sinus rhythm.  She is currently on IV diltiazem and heparin.  When she is consistently taking oral medications, would start Eliquis and stop heparin.  Jackie Russman continue metoprolol 25 mg twice daily.  2.  Encephalopathy: Likely secondary to multiple medical problems.  Plan per primary team.  [facility]ues [place]anek sign off.   Medication Recommendations:  Eliquis 5 mg BID, coreg 25 mg BID Other recommendations (labs, testing, etc):   Follow up as an outpatient:  to be arranged in cardiology clinic  For questions or updates, please contact [facility] Please consult [email or web address] for contact info under  Signed, Adison Jerger Jorja Loa, MD  09/10/2021, 11:49 AM

## 2021-09-10 NOTE — Consult Note (Signed)
Makila Maler MRN: [id number] DOB/AGE: April 21, 1951 71 y.o. Primary Care Physician:Pcp, No Admit date: 09/07/2021 Chief Complaint:  Chief Complaint  Patient presents with   Emesis   Diarrhea   HPI: Julianny Kary is a 71 y.o. female with a PMH significant for nicotine dependence, chronic pain syndrome, HLD who was brought to the ER with chief complaint of altered mental status.  History of present illness date back to last week of February when patient became confused after having multiple episodes of emesis, diarrhea and poor PO intake.   Upon evaluation in the ER patient was found to have hypokalemia, patient chest x-ray showed hyperinflated lungs failed and patient CT abdomen showed diffuse wall thickening.  Patient also had elevated troponins, pyuria. Patient CT head showed mild atrophy and white matter microvascular ischemic changes. Nothing acute was identified.  Patient abdominal ultrasound was done which wa positive for borderline gallbladder hydrops and positive sonographic murphy sign. Surgery was consulted. They were treated with supportive care including electrolyte repletion as well as antibiotics for UTI.   Patient was admitted to medicine service for further workup and management of AMS as outlined in detail below.   Overnight 3/1, patient had an event of Afib RVR which is presumably a new diagnosis for her. She was started on diltiazem bolus and drip which controlled her rate and she remained hemodynamically stable. Cardiology was consulted to evaluate.    Nephrology was consulted for hyponatremia  Patient remains lethargic and confused Patient is unable to offer any complaints Patient's family is present in the room Patient daughter informed me that at baseline patient is functional is able to take care of her ADLs and IADLs  Past Medical History:  Diagnosis Date   Fibromyalgia         History reviewed. No pertinent family history. NO family hx of ESRD  Social  History:  reports that she has been smoking cigarettes. She does not have any smokeless tobacco history on file. No history on file for alcohol use and drug use.   Allergies: Not on File  Medications Prior to Admission  Medication Sig Dispense Refill   albuterol (VENTOLIN HFA) 108 (90 Base) MCG/ACT inhaler Inhale into the lungs every 6 (six) hours as needed for wheezing or shortness of breath.     atorvastatin (LIPITOR) 40 MG tablet Take 40 mg by mouth daily.     carboxymethylcellulose 1 % ophthalmic solution 1 drop 3 (three) times daily.     celecoxib (CELEBREX) 200 MG capsule Take 200 mg by mouth 2 (two) times daily.     cilostazol (PLETAL) 100 MG tablet Take 100 mg by mouth 2 (two) times daily.     fluticasone (FLONASE) 50 MCG/ACT nasal spray Place into both nostrils daily.     gabapentin (NEURONTIN) 800 MG tablet Take 800 mg by mouth 3 (three) times daily.     omeprazole (PRILOSEC) 40 MG capsule Take 40 mg by mouth daily.     ondansetron (ZOFRAN) 8 MG tablet Take by mouth every 8 (eight) hours as needed for nausea or vomiting.     oxyCODONE-acetaminophen (PERCOCET) 10-325 MG tablet Take 1 tablet by mouth every 4 (four) hours as needed for pain.     sodium chloride (OCEAN) 0.65 % SOLN nasal spray Place 1 spray into both nostrils as needed for congestion.         NBV:APOLID to get any data    atorvastatin  40 mg Oral Daily   carvedilol  25 mg Oral BID  WC   lisinopril  20 mg Oral Daily   methimazole  5 mg Oral Daily   pantoprazole (PROTONIX) IV  40 mg Intravenous Q24H   spironolactone  50 mg Oral Daily        Physical Exam: Vital signs in last 24 hours: Temp:  [97.6 F (36.4 C)-98.7 F (37.1 C)] 98.5 F (36.9 C) (03/04 1539) Pulse Rate:  [79-94] 85 (03/04 1539) Resp:  [21-31] 30 (03/04 1539) BP: (132-198)/(89-109) 188/109 (03/04 1539) SpO2:  [95 %-100 %] 97 % (03/04 1539) Weight change:  Last BM Date : 09/08/21  Intake/Output from previous day: 03/03 0701 - 03/04  0700 In: 1002.9 [I.V.:1002.9] Out: 3 [Urine:3] Total I/O In: 83.8 [I.V.:83.8] Out: -    Physical Exam: General- pt is Critically ill-appearing, lethargic HEENT-head is atraumatic normocephalic oral mucosa dry Resp- No acute REsp distress, CTA B/L NO Rhonchi CVS- S1S2 regular in rate and rhythm GIT- BS+, soft, nondistended EXT- NO LE Edema, Cyanosis    Lab Results: CBC Recent Labs    09/09/21 0554 09/10/21 0546  WBC 15.5* 18.3*  HGB 17.1* 17.2*  HCT 50.6* 50.9*  PLT 319 315    BMET Recent Labs    09/09/21 0554 09/10/21 0846  NA 154* 152*  K 3.2* 3.8  CL 117* 117*  CO2 27 26  GLUCOSE 126* 149*  BUN 31* 33*  CREATININE 0.87 0.88  CALCIUM 9.2 9.0    MICRO Recent Results (from the past 240 hour(s))  Blood culture (single)     Status: None (Preliminary result)   Collection Time: 09/07/21 11:09 AM   Specimen: BLOOD LEFT ARM  Result Value Ref Range Status   Specimen Description BLOOD LEFT ARM  Final   Special Requests   Final    BOTTLES DRAWN AEROBIC AND ANAEROBIC Blood Culture adequate volume   Culture   Final    NO GROWTH 3 DAYS Performed at [facility], [address], [place], [place] [address]    Report Status PENDING  Incomplete  Resp Panel by RT-PCR (Flu A&B, Covid) Nasopharyngeal Swab     Status: None   Collection Time: 09/07/21 11:09 AM   Specimen: Nasopharyngeal Swab; Nasopharyngeal(NP) swabs in vial transport medium  Result Value Ref Range Status   SARS Coronavirus 2 by RT PCR NEGATIVE NEGATIVE Final    Comment: (NOTE) SARS-CoV-2 target nucleic acids are NOT DETECTED.  The SARS-CoV-2 RNA is generally detectable in upper respiratory specimens during the acute phase of infection. The lowest concentration of SARS-CoV-2 viral copies this assay can detect is 138 copies/mL. A negative result does not preclude SARS-Cov-2 infection and should not be used as the sole basis for treatment or other patient management decisions. A negative  result may occur with  improper specimen collection/handling, submission of specimen other than nasopharyngeal swab, presence of viral mutation(s) within the areas targeted by this assay, and inadequate number of viral copies(<138 copies/mL). A negative result must be combined with clinical observations, patient history, and epidemiological information. The expected result is Negative.  Fact Sheet for Patients:  BloggerCourse.com  Fact Sheet for Healthcare Providers:  SeriousBroker.it  This test is no t yet approved or cleared by the [place] FDA and  has been authorized for detection and/or diagnosis of SARS-CoV-2 by FDA under an Emergency Use Authorization (EUA). This EUA will remain  in effect (meaning this test can be used) for the duration of the COVID-19 declaration under Section 564(b)(1) of the Act, 21 U.S.C.section 360bbb-3(b)(1), unless the authorization  is terminated  or revoked sooner.       Influenza A by PCR NEGATIVE NEGATIVE Final   Influenza B by PCR NEGATIVE NEGATIVE Final    Comment: (NOTE) The Xpert Xpress SARS-CoV-2/FLU/RSV plus assay is intended as an aid in the diagnosis of influenza from Nasopharyngeal swab specimens and should not be used as a sole basis for treatment. Nasal washings and aspirates are unacceptable for Xpert Xpress SARS-CoV-2/FLU/RSV testing.  Fact Sheet for Patients: BloggerCourse.com  Fact Sheet for Healthcare Providers: SeriousBroker.it  This test is not yet approved or cleared by the [place] FDA and has been authorized for detection and/or diagnosis of SARS-CoV-2 by FDA under an Emergency Use Authorization (EUA). This EUA will remain in effect (meaning this test can be used) for the duration of the COVID-19 declaration under Section 564(b)(1) of the Act, 21 U.S.C. section 360bbb-3(b)(1), unless the authorization is  terminated or revoked.  Performed at [facility], [address] [place], [place] [address]   Urine Culture     Status: Abnormal (Preliminary result)   Collection Time: 09/07/21 11:23 AM   Specimen: Urine, Clean Catch  Result Value Ref Range Status   Specimen Description   Final    URINE, CLEAN CATCH Performed at [facility], [address], [place], [place] [address]    Special Requests   Final    NONE Performed at [facility], [address] [place], [place] [address]    Culture >=100,000 COLONIES/mL ESCHERICHIA COLI (A)  Final   Report Status PENDING  Incomplete  C Difficile Quick Screen w PCR reflex     Status: None   Collection Time: 09/07/21  7:21 PM   Specimen: STOOL  Result Value Ref Range Status   C Diff antigen NEGATIVE NEGATIVE Final   C Diff toxin NEGATIVE NEGATIVE Final   C Diff interpretation No C. difficile detected.  Final    Comment: Performed at [facility], [address] [place], [place] [address]  Gastrointestinal Panel by PCR , Stool     Status: None   Collection Time: 09/07/21  7:21 PM   Specimen: STOOL  Result Value Ref Range Status   Campylobacter species NOT DETECTED NOT DETECTED Final   Plesimonas shigelloides NOT DETECTED NOT DETECTED Final   Salmonella species NOT DETECTED NOT DETECTED Final   Yersinia enterocolitica NOT DETECTED NOT DETECTED Final   Vibrio species NOT DETECTED NOT DETECTED Final   Vibrio cholerae NOT DETECTED NOT DETECTED Final   Enteroaggregative E coli (EAEC) NOT DETECTED NOT DETECTED Final   Enteropathogenic E coli (EPEC) NOT DETECTED NOT DETECTED Final   Enterotoxigenic E coli (ETEC) NOT DETECTED NOT DETECTED Final   Shiga like toxin producing E coli (STEC) NOT DETECTED NOT DETECTED Final   Shigella/Enteroinvasive E coli (EIEC) NOT DETECTED NOT DETECTED Final   Cryptosporidium NOT DETECTED NOT DETECTED Final   Cyclospora cayetanensis NOT DETECTED NOT DETECTED Final    Entamoeba histolytica NOT DETECTED NOT DETECTED Final   Giardia lamblia NOT DETECTED NOT DETECTED Final   Adenovirus F40/41 NOT DETECTED NOT DETECTED Final   Astrovirus NOT DETECTED NOT DETECTED Final   Norovirus GI/GII NOT DETECTED NOT DETECTED Final   Rotavirus A NOT DETECTED NOT DETECTED Final   Sapovirus (I, II, IV, and V) NOT DETECTED NOT DETECTED Final    Comment: Performed at [facility], [address], [place], [place] [address]      Lab Results  Component Value Date   CALCIUM  9.0 09/10/2021      Impression:   1)Hypernatremia  Patient has hypernatremia secondary to GI losses, insensible losses and inability to ask for water as patient has altered mental status  Patient has water deficit   Water deficit calculation 0.4 times 63 kilos times 150 /140  -1 = 2.16 liters  It is approximately  2400ml  12 meq to be corrected over 48 hours  2400 divided by  48= 1950ml/hour of free water    We will increase it to 60 mL/h in an attempt to accommodate for continuing insensible losses   2)HTN  BP is not at goal  Patient is on p.o. 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 We will continue to give patient free water. We will try to correct patient's hypernatremia at less than 6 to 8 mEq per 24 hours I agree with the current treatment plan for UTI, enterocolitis and hypertension    Dyana Magner s [facility] 09/10/2021, 4:09 PM

## 2021-09-10 NOTE — Consult Note (Signed)
ANTICOAGULATION CONSULT NOTE ? ?Pharmacy Consult for Heparin ?Indication: atrial fibrillation ? ?Not on File ? ?Patient Measurements: ?Height: 5\' 10"  (177.8 cm) ?Weight: 63.5 kg (140 lb) ?IBW/kg (Calculated) : 68.5 ?Heparin Dosing Weight: 63.5 kg ? ?Vital Signs: ?Temp: 97.9 ?F (36.6 ?C) (03/04 0300) ?Temp Source: Oral (03/04 0300) ?BP: 180/97 (03/04 0300) ?Pulse Rate: 79 (03/04 0300) ? ?Labs: ?Recent Labs  ?  09/07/21 ?1109 09/07/21 ?1418 09/08/21 ?0336 09/08/21 ?1259 09/08/21 ?1900 09/09/21 ?11/09/21 09/09/21 ?1423 09/10/21 ?11/10/21  ?HGB 16.2*  16.3*  --  14.8  --   --  17.1*  --  17.2*  ?HCT 47.1*  47.7*  --  43.6  --   --  50.6*  --  50.9*  ?PLT 263  259  --  264  --   --  319  --  315  ?APTT  --   --   --  76*  --   --   --   --   ?LABPROT  --   --   --  16.4*  --   --   --   --   ?INR  --   --   --  1.3*  --   --   --   --   ?HEPARINUNFRC  --   --   --   --    < > 0.43 0.46 0.58  ?CREATININE 0.92  --  0.89  --   --  0.87  --   --   ?TROPONINIHS 45* 48*  --   --   --   --   --   --   ? < > = values in this interval not displayed.  ? ? ? ?Estimated Creatinine Clearance: 59.5 mL/min (by C-G formula based on SCr of 0.87 mg/dL). ? ? ?Medical History: ?Past Medical History:  ?Diagnosis Date  ? Fibromyalgia   ? ? ?Medications:  ?No DOAC PTA. Was on enoxaparin for DVT ppx.  ? ?Assessment: ?71 year old female presented with emesis and diarrhea. Pt was found to be in afib and was given ditl 15 IV x 1 and converted back to NSR. Cards following and want to initiate heparin and dilt gtt. CHADSVASc 2, low risk of clotting. No know cardiac history. Pt currently has issues with PO medications. Speech to follow.  ? ?Date Time HL Rate/comment ?3/2 1900 0.29 850 un/hr, subthera ?3/3       0554   0.43     Therapeutic  ?3/3 1423 0.46 Therapeutic.  ?3/4       0546    0.58    Therapeutic X 3  ? ? ?Goal of Therapy:  ?Heparin level 0.3-0.7 units/ml ?Monitor platelets by anticoagulation protocol: Yes ?  ?Plan:  ?3/4: HL @ 0546 = 0.58,  Therapeutic X 3 ?Will continue pt on current rate and recheck HL on 3/5 with AM labs.  ? ?62, PharmD ?09/10/2021,6:41 AM ? ? ?

## 2021-09-11 ENCOUNTER — Inpatient Hospital Stay: Payer: Medicare Other

## 2021-09-11 DIAGNOSIS — E860 Dehydration: Secondary | ICD-10-CM | POA: Diagnosis not present

## 2021-09-11 DIAGNOSIS — R4182 Altered mental status, unspecified: Secondary | ICD-10-CM | POA: Diagnosis not present

## 2021-09-11 DIAGNOSIS — G9341 Metabolic encephalopathy: Secondary | ICD-10-CM | POA: Diagnosis not present

## 2021-09-11 DIAGNOSIS — I4891 Unspecified atrial fibrillation: Secondary | ICD-10-CM | POA: Diagnosis not present

## 2021-09-11 DIAGNOSIS — T17908A Unspecified foreign body in respiratory tract, part unspecified causing other injury, initial encounter: Secondary | ICD-10-CM

## 2021-09-11 LAB — COMPREHENSIVE METABOLIC PANEL
ALT: 12 U/L (ref 0–44)
AST: 19 U/L (ref 15–41)
Albumin: 2.8 g/dL — ABNORMAL LOW (ref 3.5–5.0)
Alkaline Phosphatase: 84 U/L (ref 38–126)
Anion gap: 13 (ref 5–15)
BUN: 30 mg/dL — ABNORMAL HIGH (ref 8–23)
CO2: 26 mmol/L (ref 22–32)
Calcium: 9.1 mg/dL (ref 8.9–10.3)
Chloride: 112 mmol/L — ABNORMAL HIGH (ref 98–111)
Creatinine, Ser: 0.83 mg/dL (ref 0.44–1.00)
GFR, Estimated: >60 mL/min (ref >60)
Glucose, Bld: 138 mg/dL — ABNORMAL HIGH (ref 70–99)
Potassium: 3.9 mmol/L (ref 3.5–5.1)
Sodium: 151 mmol/L — ABNORMAL HIGH (ref 135–145)
Total Bilirubin: 0.8 mg/dL (ref 0.3–1.2)
Total Protein: 6.8 g/dL (ref 6.5–8.1)

## 2021-09-11 LAB — GLUCOSE, CAPILLARY: Glucose-Capillary: 121 mg/dL — ABNORMAL HIGH (ref 70–99)

## 2021-09-11 LAB — CBC
HCT: 53.1 % — ABNORMAL HIGH (ref 36.0–46.0)
Hemoglobin: 17.6 g/dL — ABNORMAL HIGH (ref 12.0–15.0)
MCH: 31.4 pg (ref 26.0–34.0)
MCHC: 33.1 g/dL (ref 30.0–36.0)
MCV: 94.8 fL (ref 80.0–100.0)
Platelets: 265 10*3/uL (ref 150–400)
RBC: 5.6 MIL/uL — ABNORMAL HIGH (ref 3.87–5.11)
RDW: 13 % (ref 11.5–15.5)
WBC: 19.6 10*3/uL — ABNORMAL HIGH (ref 4.0–10.5)
nRBC: 0 % (ref 0.0–0.2)

## 2021-09-11 LAB — URINE CULTURE: Culture: >=100000 — AB

## 2021-09-11 LAB — HEPARIN LEVEL (UNFRACTIONATED): Heparin Unfractionated: 0.52 IU/mL (ref 0.30–0.70)

## 2021-09-11 IMAGING — DX DG ABDOMEN 1V
1 series · 1 of 1 positions shown · non-contrast
Comparison: None.

CLINICAL DATA: Status post nasogastric tube placement.

EXAM:
ABDOMEN - 1 VIEW

[abdomen supine]
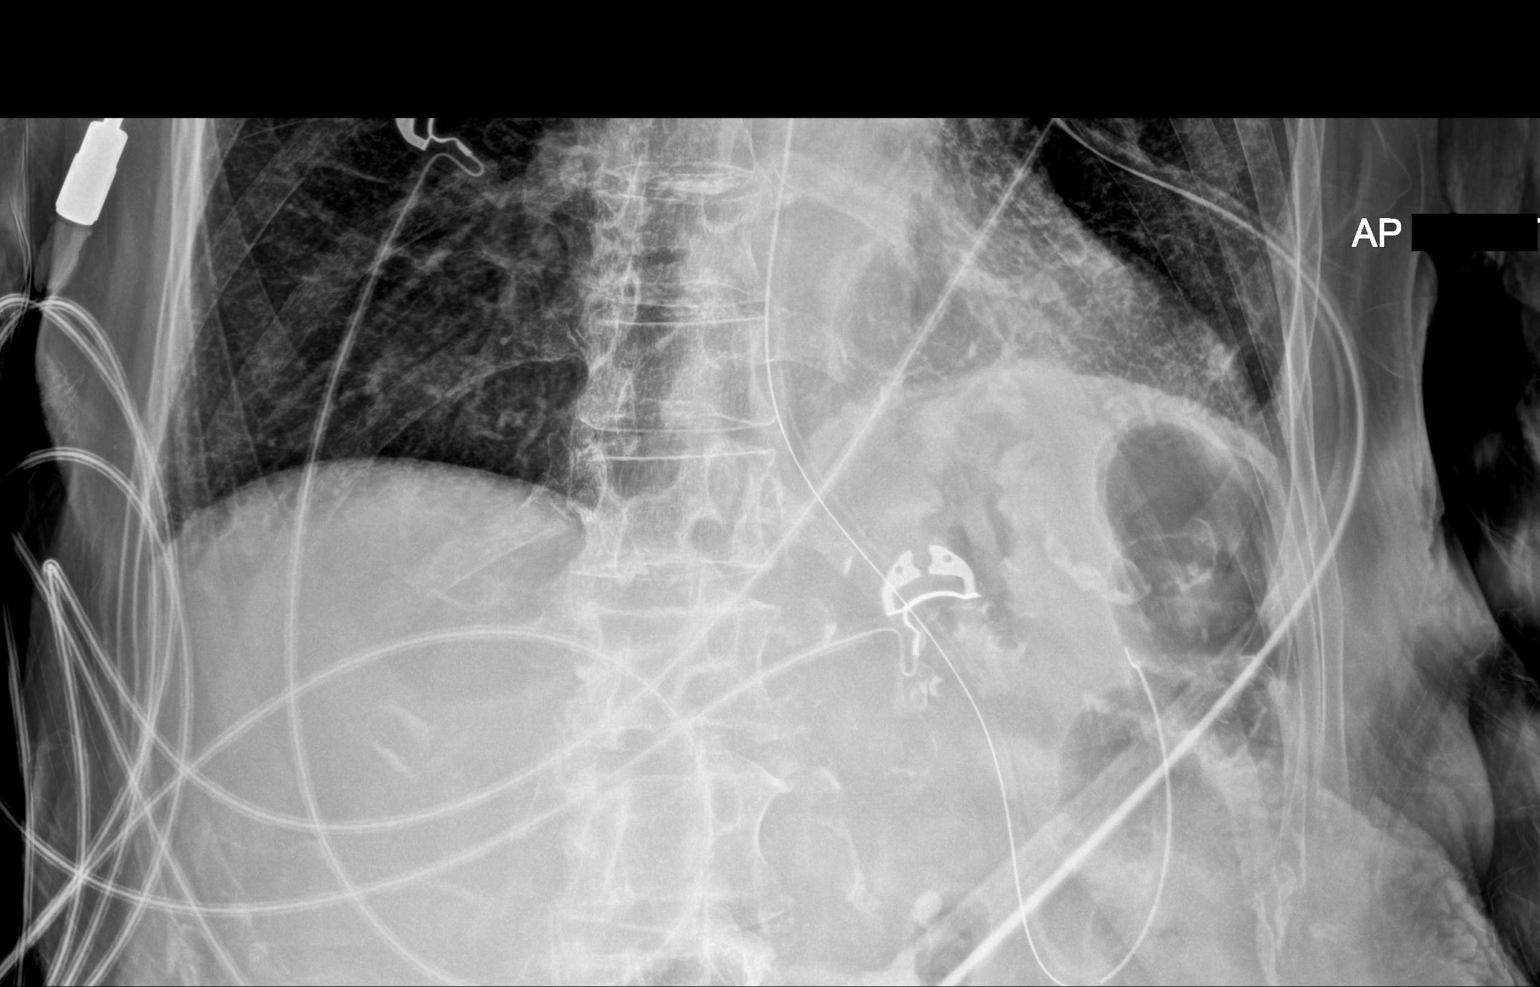

[1 of 1 positions shown; findings below may reference images not displayed]

FINDINGS: A nasogastric tube is seen with its distal end looped within the
body of the stomach. The distal tip overlies expected region of the
gastric fundus. The bowel gas pattern is normal. No radio-opaque
calculi or other significant radiographic abnormality are seen.
IMPRESSION: Nasogastric tube positioning, as described above.

## 2021-09-11 IMAGING — CT CT HEAD W/O CM
4 series · 17 of 47 positions shown, 19 images · non-contrast
Comparison: [DATE]

CLINICAL DATA: Altered mental status



[Series 2: head wo · axial · 0.40mm/px · z∈[-146,-21]mm · 7 of 35 slices shown, 9 images]
[im 5/35  brain]
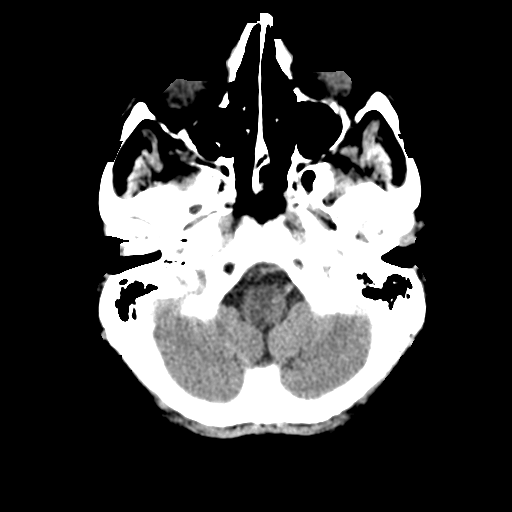
[im 5/35  bone]
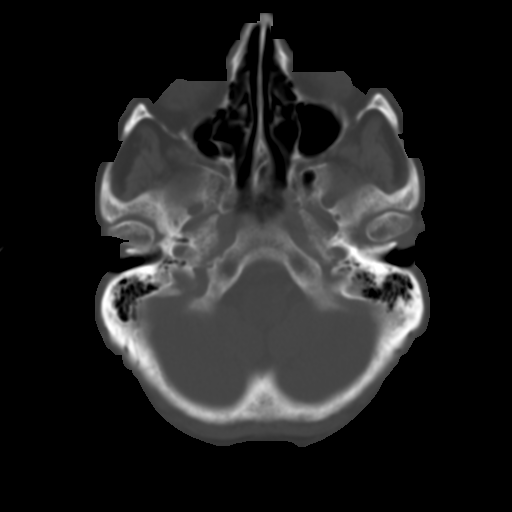
[im 9/35  brain]
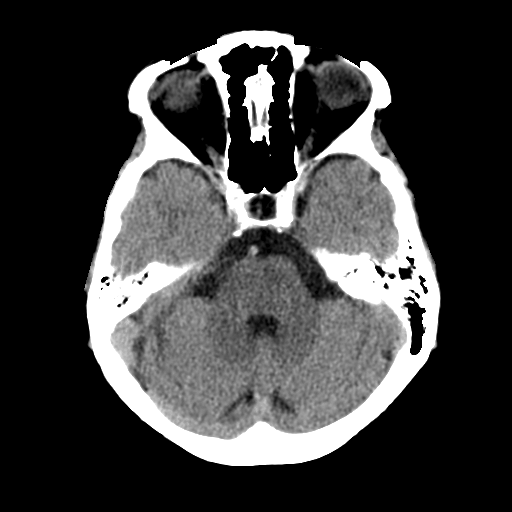
[im 13/35  brain]
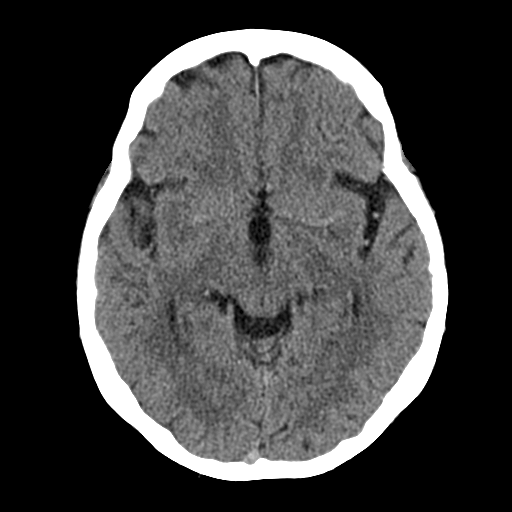
[im 18/35  brain]
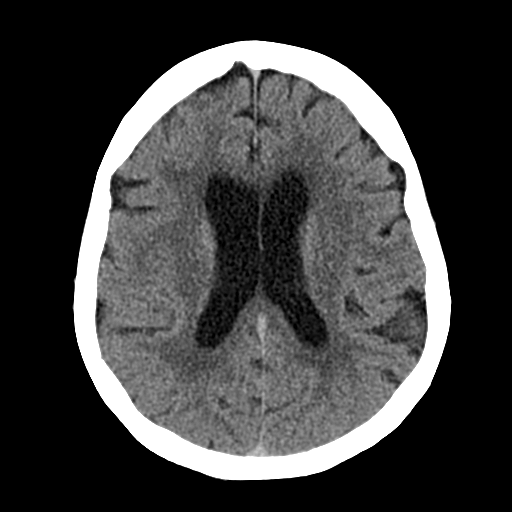
[im 22/35  brain]
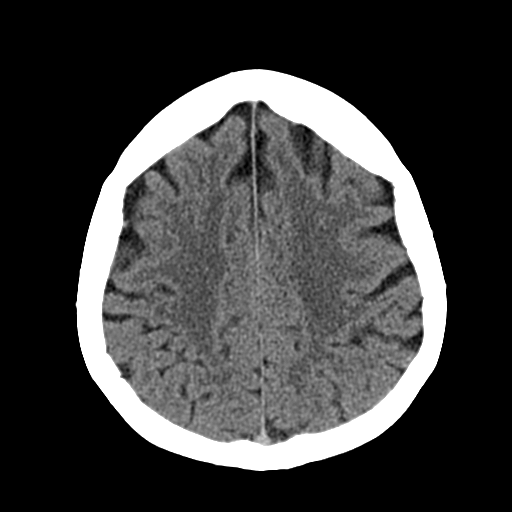
[im 22/35  bone]
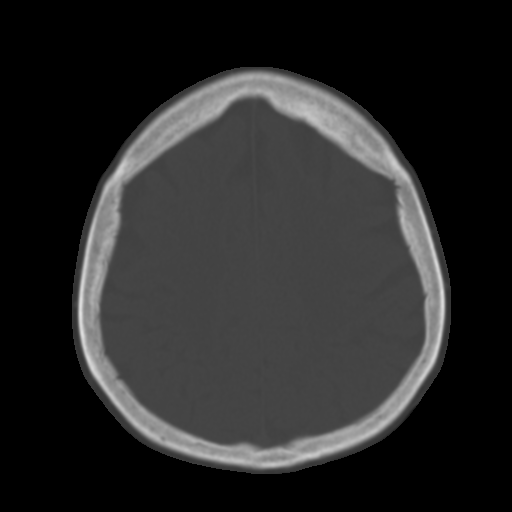
[im 26/35  brain]
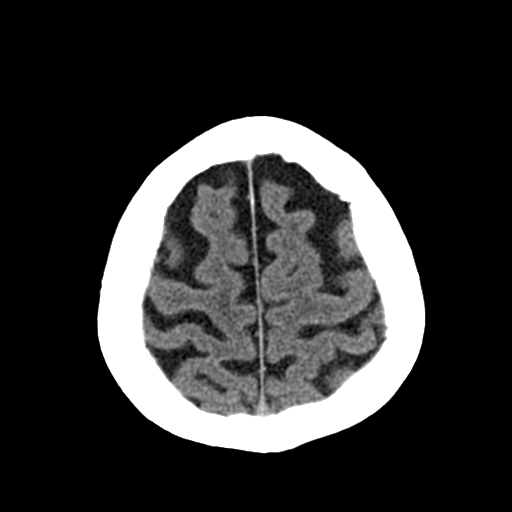
[im 30/35  brain]
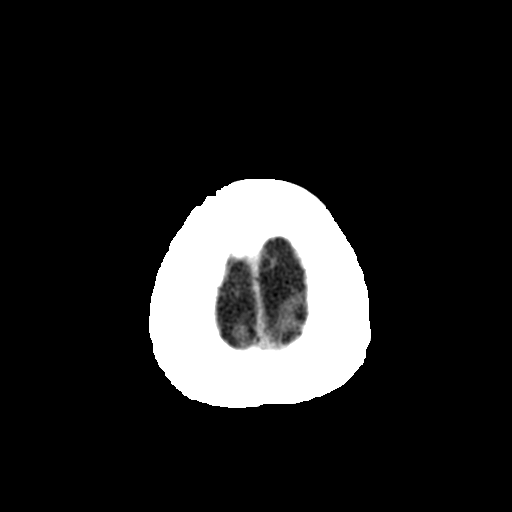

[Series 3: head bone · axial · 0.40mm/px · z∈[-150,-90]mm · 4 of 87 slices shown]
[im 9/87  bone]
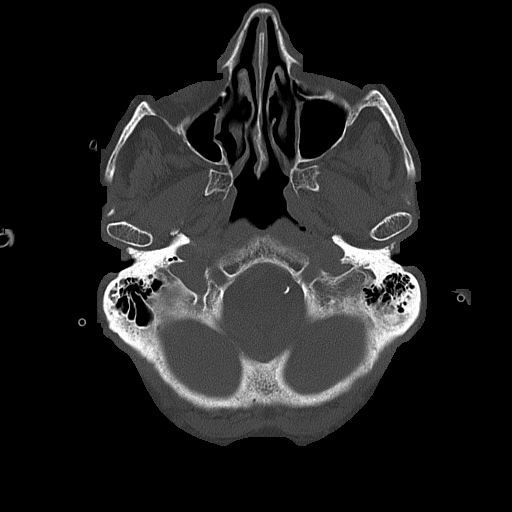
[im 18/87  bone]
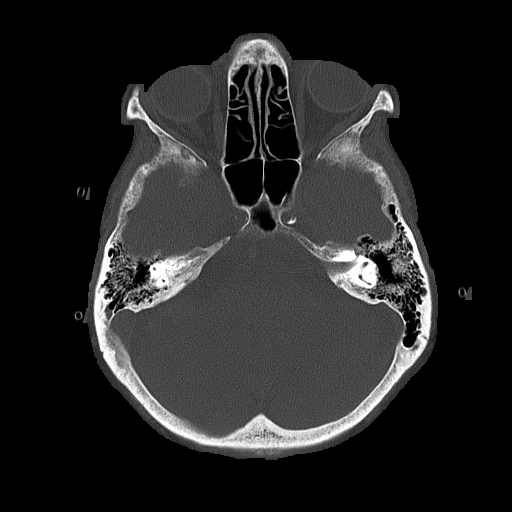
[im 26/87  bone]
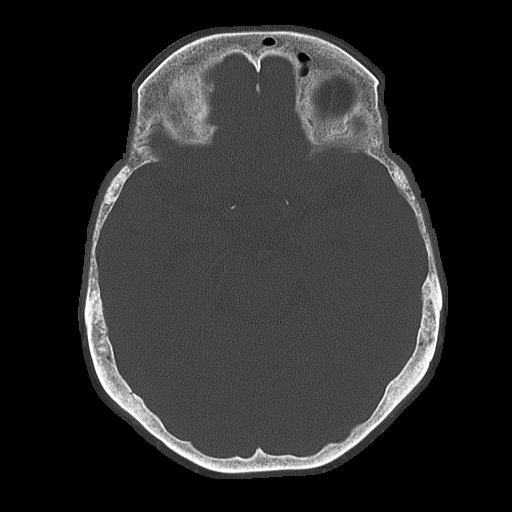
[im 39/87  bone]
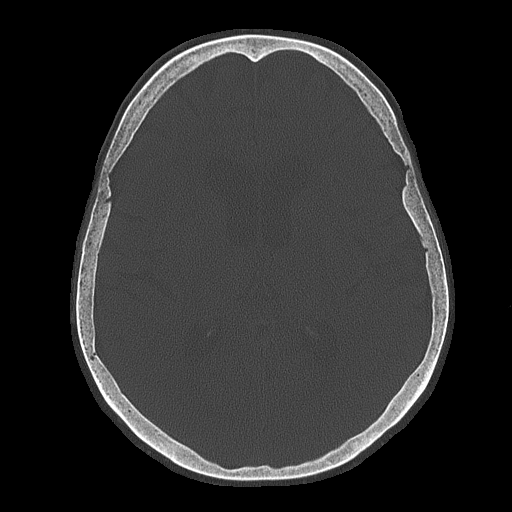

[Series 4: coronal soft tissue · coronal · 0.34mm/px · 3 of 66 slices shown]
[im 22/66  brain]
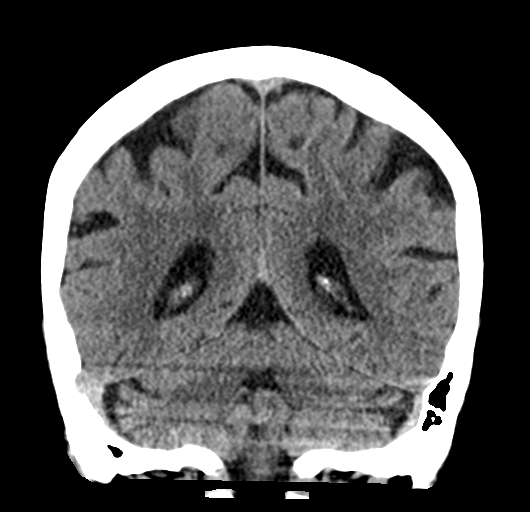
[im 29/66  brain]
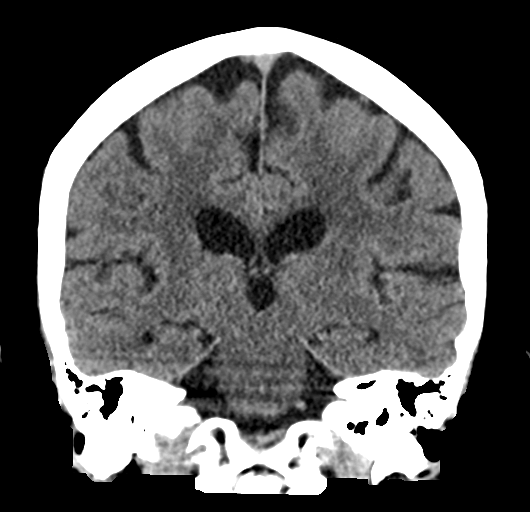
[im 37/66  brain]
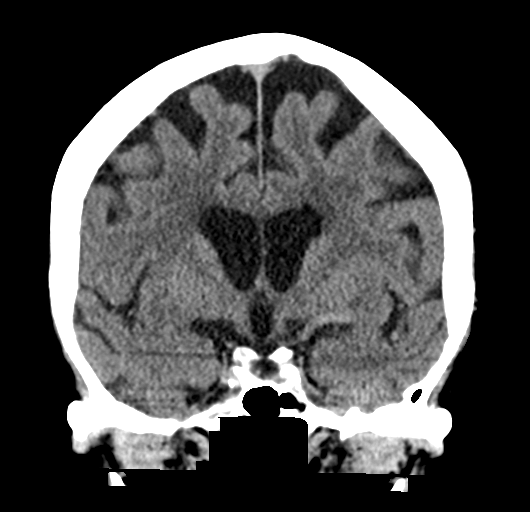

[Series 5: sagittal soft tissue · sagittal · 0.34mm/px · 3 of 60 slices shown]
[im 20/60  brain]
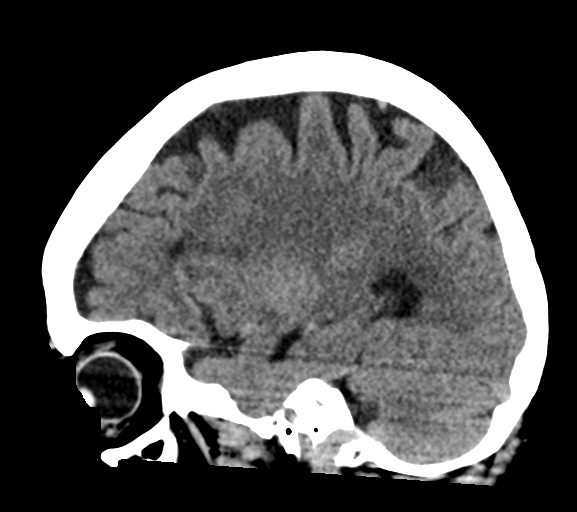
[im 30/60  brain]
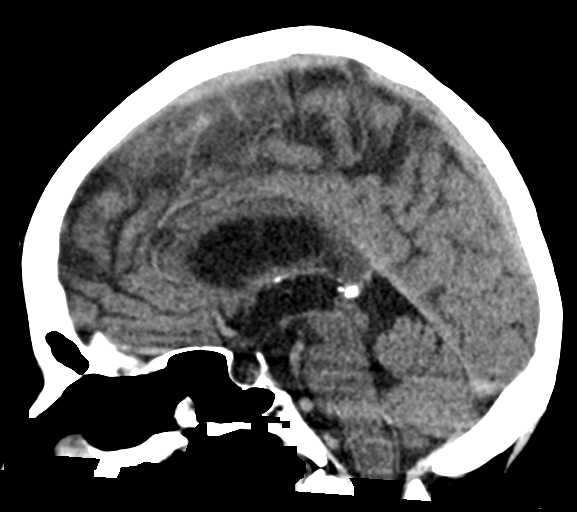
[im 40/60  brain]
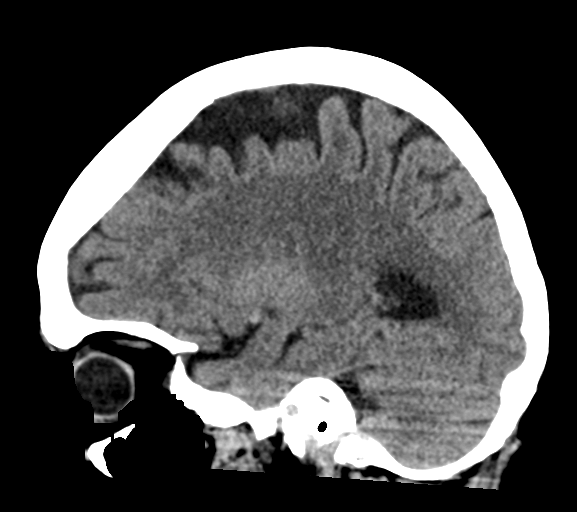

[17 of 47 positions shown; findings below may reference images not displayed]

FINDINGS: Brain: No evidence of acute infarction, hemorrhage, hydrocephalus,
extra-axial collection or mass lesion/mass effect. Mild atrophic and
chronic white matter ischemic changes are noted.

Vascular: No hyperdense vessel or unexpected calcification.

Skull: Normal. Negative for fracture or focal lesion.

Sinuses/Orbits: No acute finding.

Other: None.
IMPRESSION: Chronic atrophic and ischemic changes without acute abnormality.

## 2021-09-11 MED ORDER — HYDROCHLOROTHIAZIDE 12.5 MG PO TABS
12.5000 mg | ORAL_TABLET | Freq: Every day | ORAL | Status: DC
  Administered 2021-09-11 – 2021-09-12 (×2): 12.5 mg via ORAL
  Filled 2021-09-11 (×2): qty 1

## 2021-09-11 MED ORDER — APIXABAN 5 MG PO TABS
5.0000 mg | ORAL_TABLET | Freq: Two times a day (BID) | ORAL | Status: DC
  Administered 2021-09-11 – 2021-09-15 (×10): 5 mg via ORAL
  Filled 2021-09-11 (×10): qty 1

## 2021-09-11 MED ORDER — HYDRALAZINE HCL 20 MG/ML IJ SOLN
10.0000 mg | Freq: Once | INTRAMUSCULAR | Status: AC
  Administered 2021-09-11: 10 mg via INTRAVENOUS
  Filled 2021-09-11: qty 1

## 2021-09-11 MED ORDER — POTASSIUM CL IN DEXTROSE 5% 20 MEQ/L IV SOLN
20.0000 meq | INTRAVENOUS | Status: DC
  Administered 2021-09-12 – 2021-09-13 (×2): 20 meq via INTRAVENOUS
  Filled 2021-09-11 (×5): qty 1000

## 2021-09-11 MED ORDER — LISINOPRIL 20 MG PO TABS
40.0000 mg | ORAL_TABLET | Freq: Every day | ORAL | Status: DC
  Administered 2021-09-11 – 2021-09-18 (×8): 40 mg via ORAL
  Filled 2021-09-11 (×8): qty 2

## 2021-09-11 NOTE — Consult Note (Signed)
ANTICOAGULATION CONSULT NOTE ? ?Pharmacy Consult for apixaban (Eliquis) ?Indication: atrial fibrillation ? ?Not on File ? ?Patient Measurements: ?Height: 5\' 10"  (177.8 cm) ?Weight: 63.5 kg (140 lb) ?IBW/kg (Calculated) : 68.5 ?Heparin Dosing Weight: 63.5 kg ? ?Vital Signs: ?Temp: 97.5 ?F (36.4 ?C) (03/05 0802) ?Temp Source: Axillary (03/05 0802) ?BP: 180/93 (03/05 0802) ?Pulse Rate: 87 (03/05 0802) ? ?Labs: ?Recent Labs  ?  09/08/21 ?1259 09/08/21 ?1900 09/09/21 ?11/09/21 09/09/21 ?1423 09/10/21 ?11/10/21 09/10/21 ?11/10/21 09/11/21 ?11/11/21  ?HGB  --    < > 17.1*  --  17.2*  --  17.6*  ?HCT  --   --  50.6*  --  50.9*  --  53.1*  ?PLT  --   --  319  --  315  --  265  ?APTT 77*  --   --   --   --   --   --   ?LABPROT 16.4*  --   --   --   --   --   --   ?INR 1.3*  --   --   --   --   --   --   ?HEPARINUNFRC  --    < > 0.43 0.46 0.58  --  0.52  ?CREATININE  --   --  0.87  --   --  0.88 0.83  ? < > = values in this interval not displayed.  ? ? ? ?Estimated Creatinine Clearance: 62.3 mL/min (by C-G formula based on SCr of 0.83 mg/dL). ? ? ?Medical History: ?Past Medical History:  ?Diagnosis Date  ? Fibromyalgia   ? ? ?Medications:  ?No DOAC PTA. Was on enoxaparin for DVT ppx.  ? ?Assessment: ?71 year old female presented with emesis and diarrhea. Pt was found to be in afib and was given ditl 15 IV x 1 and converted back to NSR. Cards following and want to initiate heparin and dilt gtt. CHADSVASc 2. Cardiology recommending anticoagulation. Pt taking oral medications. Transitioning to PO anticoagulation. ? ?Goal of Therapy:  ?Heparin level 0.3-0.7 units/ml ?Monitor platelets by anticoagulation protocol: Yes ?  ?Plan:  ?Stop heparin. Start Eliquis 5 mg BID. Give first dose just after turning off heparin infusion. ? ?62, PharmD ?Pharmacy Resident  ?09/11/2021 ?8:11 AM ? ? ? ?

## 2021-09-11 NOTE — Care Management (Signed)
Cross coverage note ?Patient admitted with altered mental status.  Family concerned about continuing decline and inability to swallow meds due to persistent altered mental status.  Head CT repeated and showed "Chronic atrophic and ischemic changes without acute abnormality".  Nurse asked about restarting IV heparin infusion for A-fib as patient unable to take Eliquis to which she was transitioned earlier in the day.  Family was agreeable to having NG tube placed for medication administration.  NG tube ordered. ?

## 2021-09-11 NOTE — Progress Notes (Signed)
[facility]  ROUNDING NOTE   Subjective:   Patient seen sitting up in bed, family at bedside Appears more alert and responsive today  Sodium improved to 151  Objective:  Vital signs in last 24 hours:  Temp:  [97.5 F (36.4 C)-98.8 F (37.1 C)] 98 F (36.7 C) (03/05 1151) Pulse Rate:  [76-90] 76 (03/05 1151) Resp:  [20-30] 26 (03/05 1151) BP: (142-199)/(84-109) 142/87 (03/05 1151) SpO2:  [95 %-98 %] 98 % (03/05 1151)  Weight change:  Filed Weights   09/07/21 1106  Weight: 63.5 kg    Intake/Output: I/O last 3 completed shifts: In: 1801.9 [I.V.:1701.9; IV Piggyback:100] Out: -    Intake/Output this shift:  No intake/output data recorded.  Physical Exam: General: NAD  Head: Normocephalic, atraumatic. Moist oral mucosal membranes  Eyes: Anicteric  Lungs:  Clear to auscultation, normal effort  Heart: Regular rate and rhythm  Abdomen:  Soft, nontender  Extremities: No peripheral edema.  Neurologic: Alert, following simple commands  Skin: No lesions  Access: None    Basic Metabolic Panel: Recent Labs  Lab 09/07/21 1109 09/08/21 0336 09/08/21 0338 09/08/21 1259 09/09/21 0554 09/10/21 0846 09/11/21 0544  NA 144 149*  --   --  154* 152* 151*  K 2.7* 2.6*  --  3.4* 3.2* 3.8 3.9  CL 104 111  --   --  117* 117* 112*  CO2 23 22  --   --  27 26 26   GLUCOSE 117* 102*  --   --  126* 149* 138*  BUN 24* 25*  --   --  31* 33* 30*  CREATININE 0.92 0.89  --   --  0.87 0.88 0.83  CALCIUM 9.4 8.5*  --   --  9.2 9.0 9.1  MG 1.9  --  1.9  --   --  2.0  --     Liver Function Tests: Recent Labs  Lab 09/07/21 1109 09/09/21 0554 09/11/21 0544  AST 36 21 19  ALT 25 18 12   ALKPHOS 144* 113 84  BILITOT 0.5 0.8 0.8  PROT 7.6 7.6 6.8  ALBUMIN 3.2* 3.2* 2.8*   Recent Labs  Lab 09/07/21 1109  LIPASE 26   No results for input(s): AMMONIA in the last 168 hours.  CBC: Recent Labs  Lab 09/07/21 1109 09/08/21 0336 09/09/21 0554 09/10/21 0546  09/11/21 0544  WBC 13.7*   13.6* 13.4* 15.5* 18.3* 19.6*  NEUTROABS 12.2*  --   --   --   --   HGB 16.2*   16.3* 14.8 17.1* 17.2* 17.6*  HCT 47.1*   47.7* 43.6 50.6* 50.9* 53.1*  MCV 92.9   93.2 93.2 93.9 94.4 94.8  PLT 263   259 264 319 315 265    Cardiac Enzymes: No results for input(s): CKTOTAL, CKMB, CKMBINDEX, TROPONINI in the last 168 hours.  BNP: Invalid input(s): POCBNP  CBG: Recent Labs  Lab 09/08/21 0239  GLUCAP 108*    Microbiology: Results for orders placed or performed during the hospital encounter of 09/07/21  Blood culture (single)     Status: None (Preliminary result)   Collection Time: 09/07/21 11:09 AM   Specimen: BLOOD LEFT ARM  Result Value Ref Range Status   Specimen Description BLOOD LEFT ARM  Final   Special Requests   Final    BOTTLES DRAWN AEROBIC AND ANAEROBIC Blood Culture adequate volume   Culture   Final    NO GROWTH 4 DAYS Performed at [facility], [address] [place], [place] [address]    Report Status PENDING  Incomplete  Resp Panel by RT-PCR (Flu A&B, Covid) Nasopharyngeal Swab     Status: None   Collection Time: 09/07/21 11:09 AM   Specimen: Nasopharyngeal Swab; Nasopharyngeal(NP) swabs in vial transport medium  Result Value Ref Range Status   SARS Coronavirus 2 by RT PCR NEGATIVE NEGATIVE Final    Comment: (NOTE) SARS-CoV-2 target nucleic acids are NOT DETECTED.  The SARS-CoV-2 RNA is generally detectable in upper respiratory specimens during the acute phase of infection. The lowest concentration of SARS-CoV-2 viral copies this assay can detect is 138 copies/mL. A negative result does not preclude SARS-Cov-2 infection and should not be used as the sole basis for treatment or other patient management decisions. A negative result may occur with  improper specimen collection/handling, submission of specimen other than nasopharyngeal swab, presence of viral mutation(s) within the areas targeted by this assay, and  inadequate number of viral copies(<138 copies/mL). A negative result must be combined with clinical observations, patient history, and epidemiological information. The expected result is Negative.  Fact Sheet for Patients:  BloggerCourse.com  Fact Sheet for Healthcare Providers:  SeriousBroker.it  This test is no t yet approved or cleared by the [place] FDA and  has been authorized for detection and/or diagnosis of SARS-CoV-2 by FDA under an Emergency Use Authorization (EUA). This EUA will remain  in effect (meaning this test can be used) for the duration of the COVID-19 declaration under Section 564(b)(1) of the Act, 21 U.S.C.section 360bbb-3(b)(1), unless the authorization is terminated  or revoked sooner.       Influenza A by PCR NEGATIVE NEGATIVE Final   Influenza B by PCR NEGATIVE NEGATIVE Final    Comment: (NOTE) The Xpert Xpress SARS-CoV-2/FLU/RSV plus assay is intended as an aid in the diagnosis of influenza from Nasopharyngeal swab specimens and should not be used as a sole basis for treatment. Nasal washings and aspirates are unacceptable for Xpert Xpress SARS-CoV-2/FLU/RSV testing.  Fact Sheet for Patients: BloggerCourse.com  Fact Sheet for Healthcare Providers: SeriousBroker.it  This test is not yet approved or cleared by the [place] FDA and has been authorized for detection and/or diagnosis of SARS-CoV-2 by FDA under an Emergency Use Authorization (EUA). This EUA will remain in effect (meaning this test can be used) for the duration of the COVID-19 declaration under Section 564(b)(1) of the Act, 21 U.S.C. section 360bbb-3(b)(1), unless the authorization is terminated or revoked.  Performed at [facility], [address], [place], [place] [address]   Urine Culture     Status: Abnormal   Collection Time: 09/07/21 11:23 AM   Specimen:  Urine, Clean Catch  Result Value Ref Range Status   Specimen Description   Final    URINE, CLEAN CATCH Performed at [facility], [address], [place], [place] [address]    Special Requests   Final    NONE Performed at [facility], [address] [place], [place] [address]    Culture >=100,000 COLONIES/mL ESCHERICHIA COLI (A)  Final   Report Status 09/11/2021 FINAL  Final   Organism ID, Bacteria ESCHERICHIA COLI (A)  Final      Susceptibility   Escherichia coli - MIC*    AMPICILLIN >=32 RESISTANT Resistant     CEFAZOLIN <=4 SENSITIVE Sensitive     CEFEPIME <=0.12 SENSITIVE Sensitive     CEFTRIAXONE <=0.25 SENSITIVE Sensitive     CIPROFLOXACIN <=0.25 SENSITIVE Sensitive     GENTAMICIN <=  1 SENSITIVE Sensitive     IMIPENEM <=0.25 SENSITIVE Sensitive     NITROFURANTOIN <=16 SENSITIVE Sensitive     TRIMETH/SULFA <=20 SENSITIVE Sensitive     AMPICILLIN/SULBACTAM >=32 RESISTANT Resistant     PIP/TAZO <=4 SENSITIVE Sensitive     * >=100,000 COLONIES/mL ESCHERICHIA COLI  C Difficile Quick Screen w PCR reflex     Status: None   Collection Time: 09/07/21  7:21 PM   Specimen: STOOL  Result Value Ref Range Status   C Diff antigen NEGATIVE NEGATIVE Final   C Diff toxin NEGATIVE NEGATIVE Final   C Diff interpretation No C. difficile detected.  Final    Comment: Performed at [facility], [address] [place], [place] [address]  Gastrointestinal Panel by PCR , Stool     Status: None   Collection Time: 09/07/21  7:21 PM   Specimen: STOOL  Result Value Ref Range Status   Campylobacter species NOT DETECTED NOT DETECTED Final   Plesimonas shigelloides NOT DETECTED NOT DETECTED Final   Salmonella species NOT DETECTED NOT DETECTED Final   Yersinia enterocolitica NOT DETECTED NOT DETECTED Final   Vibrio species NOT DETECTED NOT DETECTED Final   Vibrio cholerae NOT DETECTED NOT DETECTED Final   Enteroaggregative E coli (EAEC) NOT DETECTED NOT DETECTED Final    Enteropathogenic E coli (EPEC) NOT DETECTED NOT DETECTED Final   Enterotoxigenic E coli (ETEC) NOT DETECTED NOT DETECTED Final   Shiga like toxin producing E coli (STEC) NOT DETECTED NOT DETECTED Final   Shigella/Enteroinvasive E coli (EIEC) NOT DETECTED NOT DETECTED Final   Cryptosporidium NOT DETECTED NOT DETECTED Final   Cyclospora cayetanensis NOT DETECTED NOT DETECTED Final   Entamoeba histolytica NOT DETECTED NOT DETECTED Final   Giardia lamblia NOT DETECTED NOT DETECTED Final   Adenovirus F40/41 NOT DETECTED NOT DETECTED Final   Astrovirus NOT DETECTED NOT DETECTED Final   Norovirus GI/GII NOT DETECTED NOT DETECTED Final   Rotavirus A NOT DETECTED NOT DETECTED Final   Sapovirus (I, II, IV, and V) NOT DETECTED NOT DETECTED Final    Comment: Performed at [facility], [address] [place], [place] [address]    Coagulation Studies: Recent Labs    09/08/21 1259  LABPROT 16.4*  INR 1.3*    Urinalysis: No results for input(s): COLORURINE, LABSPEC, PHURINE, GLUCOSEU, HGBUR, BILIRUBINUR, KETONESUR, PROTEINUR, UROBILINOGEN, NITRITE, LEUKOCYTESUR in the last 72 hours.  Invalid input(s): APPERANCEUR    Imaging: DG Chest Port 1 View  Result Date: 09/10/2021 CLINICAL DATA:  Possible aspiration into airway. History of nicotine dependence, chronic pain syndrome and dyslipidemia. EXAM: PORTABLE CHEST 1 VIEW COMPARISON:  09/08/2021 FINDINGS: Heart size is normal. No signs of pleural effusion or edema. Bilateral lower lung zone predominant reticular interstitial opacities are again identified, left greater than right. No superimposed interstitial edema or airspace consolidation. IMPRESSION: 1. No acute abnormality. 2. Lower lung zone predominant reticular interstitial opacities are favored to represent sequelae of mild interstitial lung disease. Electronically Signed   By: Signa Kellaylor  Stroud M.D.   On: 09/10/2021 09:13     Medications:    cefTRIAXone (ROCEPHIN)  IV 2 g (09/10/21  1151)   dextrose 5 % with KCl 20 mEq / L 70 mL/hr at 09/11/21 1043    apixaban  5 mg Oral BID   atorvastatin  40 mg Oral Daily   carvedilol  25 mg Oral BID WC   hydrochlorothiazide  12.5 mg Oral Daily   lisinopril  40 mg Oral Daily  methimazole  5 mg Oral Daily   pantoprazole (PROTONIX) IV  40 mg Intravenous Q24H   spironolactone  50 mg Oral Daily   albuterol, ondansetron **OR** ondansetron (ZOFRAN) IV, oxyCODONE-acetaminophen  Assessment/ Plan:  Ms. Joy Clark is a 71 y.o.  female a PMH significant for nicotine dependence, chronic pain syndrome, HLD who was brought to the ER with chief complaint of altered mental status.  Hyponatremia secondary to GI losses, and sensibility losses and inability to ask for water as patient has altered mental status.  Patient appears to have a water deficit therefore sodium level is highly concentrated.  Patient currently receiving D5 with potassium chloride at 60 mL/h.  We will increase to 70 mL/h and continue to monitor sodium correction.  Goal correction of over 48 hours.  2.  Hypertension blood pressure remains elevated 142/87.  Currently receiving carvedilol 25 mg twice daily, hydrochlorothiazide 12.5 mg daily, lisinopril 40 mg daily and spironolactone 50 mg daily.  We will continue to monitor  3.  Polycythemia versus hemoconcentration  Level continues to worsen today 17.6.  We will continue to monitor  4.  Chronic diastolic heart failure echo completed on 09/09/2021 shows EF 60 to 65% with a grade 1 diastolic dysfunction.  We will continue to monitor IV fluids with attempts to avoid fluid overload.  5.  Urinary tract infection.  Patient currently prescribed ceftriaxone     LOS: 4   3/5/202312:20 PM

## 2021-09-11 NOTE — Progress Notes (Signed)
Physical Therapy Treatment ?Patient Details ?Name: Joy Clark ?MRN: [id number] ?DOB: June 20, 1951 ?Today's Date: 09/11/2021 ? ? ?History of Present Illness Joy Clark is a 71 y.o. female with medical history significant for nicotine dependence, chronic pain syndrome, dyslipidemia who was brought into the ER by EMS for evaluation of mental status changes for about 4 days. ? ?  ?PT Comments  ? ? Pt tolerated treatment fair today; able to improve seated balance at EOB and activity tolerance. Due to impaired mentation, pt continues to present with lethargy and requires increased multimodal cues throughout session for safety, sequencing, and participation. Continues to require total assist for bed mobility, transfers, and gait at bedside. Able to take 2 lateral steps towards [facility] with total assist for balance, weight shift, and BLE facilitation; no knee buckling noted with weight shift. Assist CNA with bathing, linen change, and oral care. Large drop in BP from beginning to end of session, however, pt unable to verbalize whether or not she was symptomatic; pt will benefit from continued monitoring of orthostatics as it was positive during OT evaluation yesterday. Pt will continue to benefit from skilled acute PT services to address deficits for return to baseline function. Will continue to recommend SNF at DC. ? ?Of note, moderate amount of substance found in pt's mouth; RN reports giving pt medication via apple sauce. Per family and RN report, pt with increased difficulty swallowing. Able to suction pocketed substance with yankauer, however, pocketing food increases pt risk of aspiration. Family inquiring about company that could provide transportation for pt from here to home in [place]; reached out to CM who states that she had discussed this with family and that they would likely have to pay out of pocket. Will relay information as well. ? ? ?    ?Recommendations for follow up therapy are one component of a  multi-disciplinary discharge planning process, led by the attending physician.  Recommendations may be updated based on patient status, additional functional criteria and insurance authorization. ? ?Follow Up Recommendations ? Skilled nursing-short term rehab (<3 hours/day) ?  ?  ?Assistance Recommended at Discharge Intermittent Supervision/Assistance  ?Patient can return home with the following Two people to help with walking and/or transfers;Two people to help with bathing/dressing/bathroom;Direct supervision/assist for medications management;Assistance with feeding;Assistance with cooking/housework;Assist for transportation;Help with stairs or ramp for entrance ?  ?   ?   ?Precautions / Restrictions Precautions ?Precautions: Fall ?Precaution Comments: Aspiration ?Restrictions ?Weight Bearing Restrictions: No  ?  ? ?Mobility ? Bed Mobility ?Overal bed mobility: Needs Assistance ?Bed Mobility: Supine to Sit, Sit to Supine ?  ?  ?Supine to sit: Total assist ?Sit to supine: Total assist ?  ?General bed mobility comments: for trunk/BLE facilitation; assist with BUE placement for static seated balance, initial posterior lean able to correct with multimodal cues ?  ? ?Transfers ?Overall transfer level: Needs assistance ?  ?Transfers: Sit to/from Stand ?Sit to Stand: Total assist ?  ?  ?  ?  ?  ?General transfer comment: to stand from EOB, without AD; PT lifting pt while blocking BLE to prevent feet sliding/buckling. Multiple attempts from EOB for pericare via CNA. Able to perform lateral weight shift with total assist, no knee buckling noted ?  ? ?Ambulation/Gait ?Ambulation/Gait assistance: Total assist ?Gait Distance (Feet): 1 Feet (2 lateral steps towards HOB) ?Assistive device: None ?  ?  ?  ?  ?General Gait Details: total assist for balance and BLE facilitation at bedside to take 2 lateral steps; able  to WB through BLE without buckling ? ? ?  ?Balance Overall balance assessment: Needs assistance ?Sitting-balance  support: Bilateral upper extremity supported, Feet unsupported ?Sitting balance-[name]: Fair ?Sitting balance - Comments: CGA-supervision with BUE placed in support decreasing to MIN A with fatigue; able to correct LOB with multimodal cues ?Postural control: Posterior lean ?Standing balance support: During functional activity ?Standing balance-[name]: Zero ?Standing balance comment: total assist to stand ?  ?  ?  ?  ?  ?  ?  ?  ?  ?  ?  ?  ? ?  ?Cognition Arousal/Alertness: Lethargic ?Behavior During Therapy: Flat affect ?Overall Cognitive Status: Difficult to assess ?  ?  ?  ?  ?  ?  ?  ?  ?  ?  ?  ?  ?  ?  ?  ?  ?General Comments: pt does not speak during session, but able to participate with multimodal cues; follows ~50% of simple 1-step commands, increased time for processing ?  ?  ? ?  ?Exercises Other Exercises ?Other Exercises: bed mobility, transfers, and gait with total assist; improved participation with multimodal cues. Family educated re: PT role/POC, DC recommendations, safety with mobility, progress, BP, improving mentation delirium, transportation at DC. They verbalized understanding. ? ?  ?General Comments General comments (skin integrity, edema, etc.): vitals end of session: 128/50 mmHg, 82 bpm ?  ?  ? ?Pertinent Vitals/Pain Pain Assessment ?Pain Assessment: Faces ?Breathing: normal ?Negative Vocalization: none ?Facial Expression: smiling or inexpressive ?Body Language: relaxed ?Consolability: no need to console ?PAINAD Score: 0  ? ? ? ?PT Goals (current goals can now be found in the care plan section) Acute Rehab PT Goals ?Patient Stated Goal: "go home" ?PT Goal Formulation: With family ?Time For Goal Achievement: 09/24/21 ?Potential to Achieve Goals: Fair ?Progress towards PT goals: Progressing toward goals ? ?  ?Frequency ? ? ? Min 2X/week ? ? ? ?  ?PT Plan Current plan remains appropriate  ? ? ?   ?AM-PAC PT "6 Clicks" Mobility   ?Outcome Measure ? Help needed turning from your back to  your side while in a flat bed without using bedrails?: A Lot ?Help needed moving from lying on your back to sitting on the side of a flat bed without using bedrails?: Total ?Help needed moving to and from a bed to a chair (including a wheelchair)?: Total ?Help needed standing up from a chair using your arms (e.g., wheelchair or bedside chair)?: Total ?Help needed to walk in hospital room?: Total ?Help needed climbing 3-5 steps with a railing? : Total ?6 Click Score: 7 ? ?  ?End of Session Equipment Utilized During Treatment: Gait belt;Oxygen (2L) ?Activity Tolerance: Patient limited by fatigue ?Patient left: in bed;with call bell/phone within reach;with bed alarm set;with family/visitor present (bed in chair position) ?Nurse Communication: Mobility status ?PT Visit Diagnosis: Muscle weakness (generalized) (M62.81);Difficulty in walking, not elsewhere classified (R26.2);Other symptoms and signs involving the nervous system (R29.898) ?  ? ? ?Time: 9150-5697 ?PT Time Calculation (min) (ACUTE ONLY): 33 min ? ?Charges:  $Therapeutic Activity: 8-22 mins ?$Neuromuscular Re-education: 8-22 mins          ?          ? ? ? ?Vira Blanco, PT, DPT ?1:19 PM,09/11/21 ? ? ?

## 2021-09-11 NOTE — Consult Note (Signed)
ANTICOAGULATION CONSULT NOTE ? ?Pharmacy Consult for Heparin ?Indication: atrial fibrillation ? ?Not on File ? ?Patient Measurements: ?Height: 5\' 10"  (177.8 cm) ?Weight: 63.5 kg (140 lb) ?IBW/kg (Calculated) : 68.5 ?Heparin Dosing Weight: 63.5 kg ? ?Vital Signs: ?Temp: 98.5 ?F (36.9 ?C) (03/05 0436) ?Temp Source: Axillary (03/05 0436) ?BP: 166/86 (03/05 0436) ?Pulse Rate: 90 (03/05 0436) ? ?Labs: ?Recent Labs  ?  09/08/21 ?1259 09/08/21 ?1900 09/09/21 ?11/09/21 09/09/21 ?1423 09/10/21 ?11/10/21 09/10/21 ?11/10/21 09/11/21 ?11/11/21  ?HGB  --    < > 17.1*  --  17.2*  --  17.6*  ?HCT  --   --  50.6*  --  50.9*  --  53.1*  ?PLT  --   --  319  --  315  --  265  ?APTT 77*  --   --   --   --   --   --   ?LABPROT 16.4*  --   --   --   --   --   --   ?INR 1.3*  --   --   --   --   --   --   ?HEPARINUNFRC  --    < > 0.43 0.46 0.58  --  0.52  ?CREATININE  --   --  0.87  --   --  0.88 0.83  ? < > = values in this interval not displayed.  ? ? ? ?Estimated Creatinine Clearance: 62.3 mL/min (by C-G formula based on SCr of 0.83 mg/dL). ? ? ?Medical History: ?Past Medical History:  ?Diagnosis Date  ? Fibromyalgia   ? ? ?Medications:  ?No DOAC PTA. Was on enoxaparin for DVT ppx.  ? ?Assessment: ?71 year old female presented with emesis and diarrhea. Pt was found to be in afib and was given ditl 15 IV x 1 and converted back to NSR. Cards following and want to initiate heparin and dilt gtt. CHADSVASc 2, low risk of clotting. No know cardiac history. Pt currently has issues with PO medications. Speech to follow.  ? ?Date Time HL Rate/comment ?3/2 1900 0.29 850 un/hr, subthera ?3/3       0554   0.43     Therapeutic  ?3/3 1423 0.46 Therapeutic.  ?3/4       0546    0.58    Therapeutic X 3  ?3/5       0544    0.52    Therapeutic X 4  ? ? ?Goal of Therapy:  ?Heparin level 0.3-0.7 units/ml ?Monitor platelets by anticoagulation protocol: Yes ?  ?Plan:  ?3/5:  HL @ 0544 = 0.42, therapeutic X 4  ?Will continue pt on current rate and recheck HL on 3/6 with AM  labs.  ? ?Trayquan Kolakowski D, PharmD ?09/11/2021,6:54 AM ? ? ?

## 2021-09-11 NOTE — TOC Initial Note (Signed)
Transition of Care (TOC) - Initial/Assessment Note  ? ? ?Patient Details  ?Name: Joy Clark ?MRN: [id number] ?Date of Birth: 06/18/1951 ? ?Transition of Care (TOC) CM/SW Contact:    ?Joy Masson, RN ?Phone Number:[phone] ?09/11/2021, 10:12 AM ? ?Clinical Narrative:                 ?Recommendations for SNF placement. Spoke with daughter Joy Clark) at bedside concerning preference for local facilities. Daughter has indicated pt was visiting this area locally prior to admission and request SNF in [place]. RN explained the process for finding a SNF and [facility] would participate by sending the required or requested information for possible admission. Daughter also inquired on transportation back to [facility]. Pt lives in [place] who was visiting prior to hospitalization. Encouraged daughter to follow up with the pt's insurance carrier for direction on this request.   ? ?TOC will follow up with Monday on possible plan of care for discharge planning needs with daughter [place]). ? ?Expected Discharge Plan: [place] ?Barriers to Discharge: Continued Medical Work up ? ? ?Patient Goals and CMS Choice ?  ?  ?Choice offered to / list presented to : Patient ? ?Expected Discharge Plan and Services ?Expected Discharge Plan: [place] ?  ?  ?  ?  ?                ?  ?  ?  ?  ?  ?  ?  ?  ?  ?  ? ?Prior Living Arrangements/Services ?  ?  ?Patient language and need for interpreter reviewed:: Yes ?       ?Need for Family Participation in Patient Care: Yes (Comment) ?Care giver support system in place?: Yes (comment) ?  ?Criminal Activity/Legal Involvement Pertinent to Current Situation/Hospitalization: No - Comment as needed ? ?Activities of Daily Living ?Home Assistive Devices/Equipment: Wheelchair ?ADL Screening (condition at time of admission) ?Patient's cognitive ability adequate to safely complete daily activities?: Yes ?Is the patient deaf or have difficulty hearing?: No ?Does the  patient have difficulty seeing, even when wearing glasses/contacts?: No ?Does the patient have difficulty concentrating, remembering, or making decisions?: Yes ?Patient able to express need for assistance with ADLs?: No ?Does the patient have difficulty dressing or bathing?: Yes ?Independently performs ADLs?: No ?Communication: Independent ?Is this a change from baseline?: Change from baseline, expected to last >3 days ?Dressing (OT): Needs assistance ?Is this a change from baseline?: Change from baseline, expected to last >3 days ?Grooming: Needs assistance ?Is this a change from baseline?: Change from baseline, expected to last >3 days ?Feeding: Needs assistance ?Is this a change from baseline?: Change from baseline, expected to last >3 days ?Bathing: Needs assistance ?Is this a change from baseline?: Change from baseline, expected to last >3 days ?Toileting: Needs assistance ?Is this a change from baseline?: Change from baseline, expected to last >3days ?In/Out Bed: Needs assistance ?Is this a change from baseline?: Change from baseline, expected to last >3 days ?Walks in Home: Needs assistance ?Is this a change from baseline?: Change from baseline, expected to last >3 days ?Does the patient have difficulty walking or climbing stairs?: Yes ?Weakness of Legs: Both ?Weakness of Arms/Hands: Both ? ?Permission Sought/Granted ?  ?  ?   ?   ?   ?   ? ?Emotional Assessment ?Appearance:: Appears stated age ?Attitude/Demeanor/Rapport: Unable to Assess, Lethargic ?  ?  ?  ?Psych Involvement: No (comment) ? ?Admission diagnosis:  Dehydration [  E86.0] ?Colitis [K52.9] ?Gallbladder hydrops [K82.1] ?RUQ pain [R10.11] ?Abnormal CT scan, gallbladder [R93.2] ?Nausea vomiting and diarrhea [R11.2, R19.7] ?Urinary tract infection with hematuria, site unspecified [N39.0, R31.9] ?Altered mental status, unspecified altered mental status type [R41.82] ?Acute metabolic encephalopathy [id number] ?Patient Active Problem List  ? Diagnosis Date  Noted  ? Hypernatremia   ? Atrial fibrillation with RVR ([place]) 09/08/2021  ? Abnormal CT scan, gallbladder   ? Altered mental status   ? Colitis   ? Dehydration   ? Gallbladder hydrops   ? Hyperthyroidism   ? Nausea vomiting and diarrhea   ? RUQ pain   ? Acute metabolic encephalopathy [id number]  ? Enterocolitis 09/07/2021  ? UTI (urinary tract infection) 09/07/2021  ? Hypokalemia 09/07/2021  ? ?PCP:  Pcp, No ?Pharmacy:  No Pharmacies Listed ? ? ? ?Social Determinants of Health (SDOH) Interventions ?  ? ?Readmission Risk Interventions ?No flowsheet data found. ? ? ?

## 2021-09-11 NOTE — Progress Notes (Signed)
?PROGRESS NOTE ? ?Joy Clark    DOB: [birth year], 71 y.o.  ?[id number]  ?  Code Status: Full Code   ?DOA: 09/07/2021   LOS: 4  ? ?Brief hospital course  ?Joy Clark is a 71 y.o. female with a PMH significant for nicotine dependence, chronic pain syndrome, HLD. ?They presented from home to the ED on 09/07/2021 with AMS x 4 days. Began having confusion and chills about 4 days ago, per daughter followed by multiple episodes of emesis, diarrhea and poor PO intake.  ?In the ED, it was found that they had hypokalemia, lactic acidosis, elevated troponins, pyuria. ?CT head showed mild atrophy and white matter microvascular ischemic changes. Nothing acute was identified. No previous to compare to. ?CT abdomen/pelvis positive for diffuse wall thickening of colon and distal small bowel consistent with inflammatory or infectious enterocolitis. Also signs of chronic vs acute cholecystitis. RUQ [place] positive for borderline gallbladder hydrops and positive sonographic murphy sign. Surgery was consulted.  ?They were treated with supportive care including electrolyte repletion as well as antibiotics for UTI. ?  ?Patient was admitted to medicine service for further workup and management of AMS as outlined in detail below. ? ?Overnight 3/1, patient had an event of Afib RVR which is presumably a new diagnosis for her. She was started on diltiazem bolus and drip which controlled her rate and she remained hemodynamically stable. Cardiology was consulted to evaluate.  ? ?09/11/21 -stable, no acute events ? ?Assessment & Plan  ?Principal Problem: ?  Acute metabolic encephalopathy ?Active Problems: ?  Enterocolitis ?  UTI (urinary tract infection) ?  Hypokalemia ?  Abnormal CT scan, gallbladder ?  Altered mental status ?  Colitis ?  Dehydration ?  Gallbladder hydrops ?  Hyperthyroidism ?  Nausea vomiting and diarrhea ?  RUQ pain ?  Atrial fibrillation with RVR ([place]) ?  Hypernatremia ? ?Acute metabolic encephalopathy on dementia ?dysphagia-   improved since admission with correction of underlying metabolic disorders but still not at baseline. No acute findings on head imaging on admission. Underlying conditions treated as below- ?- PT/OT/SLP ?- soft diet as tolerated. Will consider NG tube feeds temporarily if needed ?- CBG monitoring ? ?New onset Afib RVR  elevated BP- converted to NSR with metoprolol and diltiazem gtt. Echo showing G1DD, EF 60-65%, sinus rhythm ?- cardiology signed off ?- heparin gtt converted to eliquis ?- switched metoprolol to carvedilol ? ?TSH low- signs of hyperthyroid. 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 Thyroid US did not show suspicious nodules. T4 elevated, T3 normal. Family describes positive history of known thyroid disorder in the past but was not on treatment. ?- continue BB ?- methimazole started at low dose, can titrate up at f/u ? ?Potential for opioid withdrawal- family endorses that patient takes large amount of percocet scheduled for LE pain.  ?- restart percocet and ativan PRN for withdrawal symptoms ? ?Gallbladder disease- ?- general surgery signed off. Does not recommend urgent surgical intervention ?- analgesia PRN and nutritional support ?  ?Hypokalemia  hypernatremia- improving  ?Secondary to GI losses from nausea, vomiting and diarrhea ?K+ 2.7>2.6>3.2>3.9, Na+  149>154>151, Mg++ wnl. ?Cr remaining stable wnl ?- Supplement potassium PRN ?- BMP in afternoon and am ?- LR with K+ supplement > D5 with K+ and increased rate for slow correction ?- nephrology following, appreciate recs ?  ?UTI (urinary tract infection)- urine culture showing e coli with sensitivity to CTX. ?- continue Rocephin x5 days ?  ?Enterocolitis- (present on admission) cdiff and GI pathogen panel  both negative. Likely viral vs inflammatory ?- supportive care and treat underlying conditions ?- dc flagyl ? ?HTN- remains very difficult to control. Likely related to hyperthyroid state and potentially medication withdrawals vs long-term under  treated ?- increased spironolactone 50mg  daily ?- increased lisinopril 40mg  daily ?- continue carvedilol 25mg  daily ?- initiated HCTZ 12.5mg  daily which will hopefully have added benefit of treating hypernatremia ?- avoid PRN medications unless in severe ranges.  ? ?Body mass index is 20.09 kg/m?. ? ?VTE ppx: heparin gttapixaban (ELIQUIS) tablet 5 mg  > eliquis ? ?Diet:  ?   ?Diet  ? DIET DYS 3 Room service appropriate? Yes; Fluid consistency: Thin  ? ?Subjective 09/11/21   ? ?Pt reports nothing today as she remains non-verbal but shows signs of recognition to her environment. Family states that today is is more responsive to them.  ?  ?Objective  ? ?Vitals:  ? 09/11/21 0414 09/11/21 0436 09/11/21 0802 09/11/21 1151  ?BP: (!) 199/104 (!) 166/86 (!) 180/93 (!) 142/87  ?Pulse:  90 87 76  ?Resp: (!) 26 (!) 24 (!) 22 (!) 26  ?Temp: 98.4 ?F (36.9 ?C) 98.5 ?F (36.9 ?C) (!) 97.5 ?F (36.4 ?C) 98 ?F (36.7 ?C)  ?TempSrc: Axillary Axillary Axillary Axillary  ?SpO2: 96% 95% 97% 98%  ?Weight:      ?Height:      ? ? ?Intake/Output Summary (Last 24 hours) at 09/11/2021 1203 ?Last data filed at 09/10/2021 1800 ?Gross per 24 hour  ?Intake 1081.91 ml  ?Output --  ?Net 1081.91 ml  ? ?Filed Weights  ? 09/07/21 1106  ?Weight: 63.5 kg  ?  ? ?Physical Exam:  ?General: awake, alert, NAD ?HEENT: atraumatic, clear conjunctiva, anicteric sclera, MMM, hearing grossly normal ?Respiratory: normal respiratory effort. ?Cardiovascular: normal S1/S2, RRR, no JVD, murmurs, quick capillary refill  ?Gastrointestinal: soft, NT, ND ?Nervous: Alert and can follow simple commands. Non-verbal ?Extremities: moves all equally, no edema, normal tone ?Skin: dry, intact, normal temperature, normal color. No rashes, lesions or ulcers on exposed skin ? ?Labs   ?I have personally reviewed the following labs and imaging studies ?CBC ?   ?Component Value Date/Time  ? WBC 19.6 (H) 09/11/2021 0544  ? RBC 5.60 (H) 09/11/2021 0544  ? HGB 17.6 (H) 09/11/2021 0544  ? HCT 53.1  (H) 09/11/2021 0544  ? PLT 265 09/11/2021 0544  ? MCV 94.8 09/11/2021 0544  ? MCH 31.4 09/11/2021 0544  ? MCHC 33.1 09/11/2021 0544  ? RDW 13.0 09/11/2021 0544  ? LYMPHSABS 0.7 09/07/2021 1109  ? MONOABS 0.5 09/07/2021 1109  ? EOSABS 0.0 09/07/2021 1109  ? BASOSABS 0.0 09/07/2021 1109  ? ?BMP Latest Ref Rng & Units 09/11/2021 09/10/2021 09/09/2021  ?Glucose 70 - 99 mg/dL 138(H) 149(H) 126(H)  ?BUN 8 - 23 mg/dL 30(H) 33(H) 31(H)  ?Creatinine 0.44 - 1.00 mg/dL 0.83 0.88 0.87  ?Sodium 135 - 145 mmol/L 151(H) 152(H) 154(H)  ?Potassium 3.5 - 5.1 mmol/L 3.9 3.8 3.2(L)  ?Chloride 98 - 111 mmol/L 112(H) 117(H) 117(H)  ?CO2 22 - 32 mmol/L 26 26 27   ?Calcium 8.9 - 10.3 mg/dL 9.1 9.0 9.2  ? ? ?DG Chest Port 1 View ? ?Result Date: 09/10/2021 ?CLINICAL DATA:  Possible aspiration into airway. History of nicotine dependence, chronic pain syndrome and dyslipidemia. EXAM: PORTABLE CHEST 1 VIEW COMPARISON:  09/08/2021 FINDINGS: Heart size is normal. No signs of pleural effusion or edema. Bilateral lower lung zone predominant reticular interstitial opacities are again identified, left greater than right. No superimposed interstitial  edema or airspace consolidation. IMPRESSION: 1. No acute abnormality. 2. Lower lung zone predominant reticular interstitial opacities are favored to represent sequelae of mild interstitial lung disease. Electronically Signed   By: Kerby Moors M.D.   On: 09/10/2021 09:13   ? ?Disposition Plan & Communication  ?Patient status: Inpatient  ?Admitted From: Home ?Planned disposition location: SNF in [place] ?Anticipated discharge date: 3/8 pending further medical evaluation ? ?Family Communication: daughter and granddaughter at bedside ?  ?Author: ?Richarda Osmond, DO ?[facility] ?09/11/2021, 12:03 PM  ? ?Available by Epic secure chat 7AM-7PM. ?If 7PM-7AM, please contact night-coverage.  ?[facility] contact information found on [email or web address].  ?

## 2021-09-12 ENCOUNTER — Telehealth: Payer: Self-pay

## 2021-09-12 DIAGNOSIS — E440 Moderate protein-calorie malnutrition: Secondary | ICD-10-CM | POA: Insufficient documentation

## 2021-09-12 DIAGNOSIS — G9341 Metabolic encephalopathy: Secondary | ICD-10-CM | POA: Diagnosis not present

## 2021-09-12 DIAGNOSIS — E860 Dehydration: Secondary | ICD-10-CM | POA: Diagnosis not present

## 2021-09-12 DIAGNOSIS — I4891 Unspecified atrial fibrillation: Secondary | ICD-10-CM | POA: Diagnosis not present

## 2021-09-12 DIAGNOSIS — R1310 Dysphagia, unspecified: Secondary | ICD-10-CM

## 2021-09-12 DIAGNOSIS — R4182 Altered mental status, unspecified: Secondary | ICD-10-CM | POA: Diagnosis not present

## 2021-09-12 DIAGNOSIS — R1319 Other dysphagia: Secondary | ICD-10-CM

## 2021-09-12 LAB — GLUCOSE, CAPILLARY
Glucose-Capillary: 135 mg/dL — ABNORMAL HIGH (ref 70–99)
Glucose-Capillary: 128 mg/dL — ABNORMAL HIGH (ref 70–99)
Glucose-Capillary: 150 mg/dL — ABNORMAL HIGH (ref 70–99)
Glucose-Capillary: 150 mg/dL — ABNORMAL HIGH (ref 70–99)

## 2021-09-12 LAB — COMPREHENSIVE METABOLIC PANEL
ALT: 13 U/L (ref 0–44)
AST: 16 U/L (ref 15–41)
Albumin: 2.8 g/dL — ABNORMAL LOW (ref 3.5–5.0)
Alkaline Phosphatase: 61 U/L (ref 38–126)
Anion gap: 9 (ref 5–15)
BUN: 26 mg/dL — ABNORMAL HIGH (ref 8–23)
CO2: 28 mmol/L (ref 22–32)
Calcium: 8.7 mg/dL — ABNORMAL LOW (ref 8.9–10.3)
Chloride: 109 mmol/L (ref 98–111)
Creatinine, Ser: 0.75 mg/dL (ref 0.44–1.00)
GFR, Estimated: >60 mL/min (ref >60)
Glucose, Bld: 127 mg/dL — ABNORMAL HIGH (ref 70–99)
Potassium: 3.7 mmol/L (ref 3.5–5.1)
Sodium: 146 mmol/L — ABNORMAL HIGH (ref 135–145)
Total Bilirubin: 0.6 mg/dL (ref 0.3–1.2)
Total Protein: 6.5 g/dL (ref 6.5–8.1)

## 2021-09-12 LAB — CBC
HCT: 49.6 % — ABNORMAL HIGH (ref 36.0–46.0)
Hemoglobin: 16.4 g/dL — ABNORMAL HIGH (ref 12.0–15.0)
MCH: 31.3 pg (ref 26.0–34.0)
MCHC: 33.1 g/dL (ref 30.0–36.0)
MCV: 94.7 fL (ref 80.0–100.0)
Platelets: 206 10*3/uL (ref 150–400)
RBC: 5.24 MIL/uL — ABNORMAL HIGH (ref 3.87–5.11)
RDW: 12.8 % (ref 11.5–15.5)
WBC: 17.4 10*3/uL — ABNORMAL HIGH (ref 4.0–10.5)
nRBC: 0 % (ref 0.0–0.2)

## 2021-09-12 LAB — PHOSPHORUS
Phosphorus: 3.1 mg/dL (ref 2.5–4.6)
Phosphorus: 2.9 mg/dL (ref 2.5–4.6)

## 2021-09-12 LAB — CULTURE, BLOOD (SINGLE)
Culture: NO GROWTH
Special Requests: ADEQUATE

## 2021-09-12 LAB — MAGNESIUM
Magnesium: 1.6 mg/dL — ABNORMAL LOW (ref 1.7–2.4)
Magnesium: 1.7 mg/dL (ref 1.7–2.4)

## 2021-09-12 MED ORDER — HYDROCHLOROTHIAZIDE 25 MG PO TABS
25.0000 mg | ORAL_TABLET | Freq: Every day | ORAL | Status: DC
Start: 2021-09-13 — End: 2021-09-12

## 2021-09-12 MED ORDER — FREE WATER
150.0000 mL | Status: DC
  Administered 2021-09-13 – 2021-09-14 (×10): 150 mL

## 2021-09-12 MED ORDER — VITAL HIGH PROTEIN PO LIQD: 1000.0000 mL | ORAL | Status: DC

## 2021-09-12 MED ORDER — OSMOLITE 1.5 CAL PO LIQD
1000.0000 mL | ORAL | Status: DC
  Administered 2021-09-12 – 2021-09-20 (×8): 1000 mL

## 2021-09-12 MED ORDER — PROSOURCE TF PO LIQD
45.0000 mL | Freq: Two times a day (BID) | ORAL | Status: DC
  Administered 2021-09-12 – 2021-09-19 (×16): 45 mL
  Filled 2021-09-12 (×18): qty 45

## 2021-09-12 NOTE — Progress Notes (Signed)
BP 198/89; MD notified with instructions to give following BP meds early: carvedilol, HCTZ, lisinopril and spironolactone; noted and carried out.  ? ?Oral care done.  ?

## 2021-09-12 NOTE — Progress Notes (Signed)
Patient cont to not swallow even with cues; when she did, patient started coughing. Spoke with daughter at bedside, states that patient has not eaten since admission. Also verbalized concern that her mother was responding and answering questions on admission but in the past 2 days, has not been responding or following commands. Patient was also switch to eliquis p.o. (from heparin gtt) today. .  Dr. Doylene Canard Notified; head CT scan and NGT placement ordered.  ?

## 2021-09-12 NOTE — Progress Notes (Signed)
Occupational Therapy Treatment ?Patient Details ?Name: Joy Clark ?MRN: [id number] ?DOB: 10/11/1950 ?Today's Date: 09/12/2021 ? ? ?History of present illness Joy Clark is a 71 y.o. female with medical history significant for nicotine dependence, chronic pain syndrome, dyslipidemia who was brought into the ER by EMS for evaluation of mental status changes for about 4 days. ?  ?OT comments ? Ms Robeck was seen for OT/PT co-treatment on this date. Upon arrival to room pt reclined in bed, family at bedside agreeable to tx. Pt verbalizes "yellow" when asked colour of socks and shakes head yes/no to 3 questions however largely non verbal. Increased tracking OT/PT and family this date. Follows ~25% of commands t/o session. MAX A sup>sit, CGA static sitting with improved attempts to use BUE for balance. MAX A x2 sit<>stand and bed>chair. MAX A oral care in sitting. Pt making progress toward goals. Pt continues to benefit from skilled OT services to maximize return to PLOF and minimize risk of future falls, injury, caregiver burden, and readmission. Will continue to follow POC. Discharge recommendation remains appropriate.  ? ?SUPINE: BP 179/90, HR 70 ?SITTING: BP 151/89, HR 72  ? ?Recommendations for follow up therapy are one component of a multi-disciplinary discharge planning process, led by the attending physician.  Recommendations may be updated based on patient status, additional functional criteria and insurance authorization. ?   ?Follow Up Recommendations ? Skilled nursing-short term rehab (<3 hours/day)  ?  ?Assistance Recommended at Discharge Frequent or constant Supervision/Assistance  ?Patient can return home with the following ? Two people to help with walking and/or transfers;Two people to help with bathing/dressing/bathroom;Help with stairs or ramp for entrance ?  ?Equipment Recommendations ? BSC/3in1  ?  ?Recommendations for Other Services   ? ?  ?Precautions / Restrictions Precautions ?Precautions:  Fall ?Precaution Comments: Aspiration ?Restrictions ?Weight Bearing Restrictions: No  ? ? ?  ? ?Mobility Bed Mobility ?Overal bed mobility: Needs Assistance ?Bed Mobility: Supine to Sit ?  ?  ?Supine to sit: Max assist ?  ?  ?  ?  ? ?Transfers ?Overall transfer level: Needs assistance ?Equipment used: 2 person hand held assist ?Transfers: Sit to/from Stand, Bed to chair/wheelchair/BSC ?Sit to Stand: Mod assist, +2 physical assistance ?Stand pivot transfers: Max assist, +2 physical assistance ?  ?  ?  ?  ?  ?  ?  ?Balance Overall balance assessment: Needs assistance ?Sitting-balance support: Feet supported ?Sitting balance-[name]: Fair ?Sitting balance - Comments: CGA, attempts to use BUE to correct minor LOBs. Cannot tolerate dynamic activity ?  ?  ?Standing balance-[name]: Zero ?  ?  ?  ?  ?  ?  ?  ?  ?  ?  ?  ?  ?   ? ?ADL either performed or assessed with clinical judgement  ? ?ADL Overall ADL's : Needs assistance/impaired ?  ?  ?  ?  ?  ?  ?  ?  ?  ?  ?  ?  ?  ?  ?  ?  ?  ?  ?  ?General ADL Comments: MAX A oral care in sitting. MAX A x2 for ADL t/f ?  ? ? ? ?Cognition Arousal/Alertness: Lethargic ?Behavior During Therapy: Flat affect ?Overall Cognitive Status: Impaired/Different from baseline ?  ?  ?  ?  ?  ?  ?  ?  ?  ?  ?  ?  ?  ?  ?  ?  ?General Comments: Pt verbalizes "yellow" when asked colour of socks and  shakes head yes/no to 3 questions however largely non verbal. Increased tracking OT/PT and family this date. Follows ~25% of commands t/o session ?  ?  ?   ?   ?   ?General Comments SUPINE: BP 179/90, HR 70. SITTING: BP 151/89, HR 72, SITTING FOLLOWING STANDING: BP 134/83  ? ? ?Pertinent Vitals/ Pain       Pain Assessment ?Pain Assessment: Faces ?Faces Pain Scale: Hurts a little bit ?Pain Location: mouth during oral care ?Pain Descriptors / Indicators: Grimacing, Moaning ?Pain Intervention(s): Limited activity within patient's tolerance, Repositioned ? ? ?Frequency ? Min 3X/week  ? ? ? ? ?   ?Progress Toward Goals ? ?OT Goals(current goals can now be found in the care plan section) ? Progress towards OT goals: Progressing toward goals ? ?Acute Rehab OT Goals ?Patient Stated Goal: to go home ?OT Goal Formulation: With family ?Time For Goal Achievement: 09/24/21 ?Potential to Achieve Goals: Fair ?ADL Goals ?Pt Will Perform Grooming: sitting;with min guard assist ?Pt Will Perform Lower Body Dressing: with min assist;sitting/lateral leans ?Pt Will Transfer to Toilet: with supervision ?Pt Will Perform Toileting - Clothing Manipulation and hygiene: with min assist;sitting/lateral leans  ?Plan Discharge plan remains appropriate;Frequency remains appropriate   ? ?Co-evaluation ? ? ?   ?  ?  ?  ?  ? ?  ?AM-PAC OT "6 Clicks" Daily Activity     ?Outcome Measure ? ? Help from another person eating meals?: A Lot ?Help from another person taking care of personal grooming?: A Lot ?Help from another person toileting, which includes using toliet, bedpan, or urinal?: A Lot ?Help from another person bathing (including washing, rinsing, drying)?: A Lot ?Help from another person to put on and taking off regular upper body clothing?: A Lot ?Help from another person to put on and taking off regular lower body clothing?: A Lot ?6 Click Score: 12 ? ?  ?End of Session   ? ?OT Visit Diagnosis: Other abnormalities of gait and mobility (R26.89);Muscle weakness (generalized) (M62.81) ?  ?Activity Tolerance Patient tolerated treatment well ?  ?Patient Left in chair;with call bell/phone within reach;with family/visitor present ?  ?Nurse Communication Mobility status ?  ? ?   ? ?Time: 0865-7846 ?OT Time Calculation (min): 28 min ? ?Charges: OT General Charges ?$OT Visit: 1 Visit ?OT Treatments ?$Self Care/Home Management : 8-22 mins ? ?Kathie Dike, M.S. OTR/L  ?09/12/21, 3:44 PM  ?ascom [phone] ? ?

## 2021-09-12 NOTE — Progress Notes (Signed)
PROGRESS NOTE  Joy Clark    DOB: 04-11-1951, 71 y.o.  OJJ:[id number]    Code Status: Full Code   DOA: 09/07/2021   LOS: 5   Brief hospital course  Joy Clark is a 71 y.o. female with a PMH significant for nicotine dependence, chronic pain syndrome, HLD. They presented from home to the ED on 09/07/2021 with AMS x 4 days. Began having confusion and chills about 4 days ago, per daughter followed by multiple episodes of emesis, diarrhea and poor PO intake.  In the ED, it was found that they had hypokalemia, lactic acidosis, elevated troponins, pyuria. CT head showed mild atrophy and white matter microvascular ischemic changes. Nothing acute was identified. No previous to compare to. CT abdomen/pelvis positive for diffuse wall thickening of colon and distal small bowel consistent with inflammatory or infectious enterocolitis. Also signs of chronic vs acute cholecystitis. RUQ [place] positive for borderline gallbladder hydrops and positive sonographic murphy sign. Surgery was consulted.  They were treated with supportive care including electrolyte repletion as well as antibiotics for UTI.   Patient was admitted to medicine service for further workup and management of AMS as outlined in detail below.  Overnight 3/1, patient had an event of Afib RVR which is presumably a new diagnosis for her. She was started on diltiazem bolus and drip which controlled her rate and she remained hemodynamically stable. Cardiology was consulted to evaluate.   09/12/21 -stable, no acute events  Assessment & Plan  Principal Problem:   Acute metabolic encephalopathy Active Problems:   Enterocolitis   UTI (urinary tract infection)   Hypokalemia   Abnormal CT scan, gallbladder   Altered mental status   Colitis   Dehydration   Gallbladder hydrops   Hyperthyroidism   Nausea vomiting and diarrhea   RUQ pain   Atrial fibrillation with RVR (HCC)   Hypernatremia   Aspiration into airway  Acute metabolic encephalopathy  on dementia dysphagia-  improved since admission with correction of underlying metabolic disorders but still not at baseline. No acute findings on head imaging on admission. Underlying conditions treated as below- - PT/OT/SLP - soft diet as tolerated.  - NG tube placed. Nutrition consulted to start diet.  - CBG monitoring  New onset Afib RVR   elevated BP- converted to NSR with metoprolol and diltiazem gtt. Echo showing G1DD, EF 60-65%, sinus rhythm - cardiology signed off - heparin gtt converted to eliquis - switched metoprolol to carvedilol  TSH low- signs of hyperthyroid. 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 Thyroid US did not show suspicious nodules. T4 elevated, T3 normal. Family describes positive history of known thyroid disorder in the past but was not on treatment. - continue BB - methimazole started at low dose, can titrate up at f/u  HTN- remains very difficult to control. Likely related to hyperthyroid state and potentially medication withdrawals vs long-term under treated - increased spironolactone 50mg  daily - increased lisinopril 40mg  daily - continue carvedilol 25mg  daily - initiated HCTZ 12.5mg  daily which will hopefully have added benefit of treating hypernatremia - avoid PRN medications unless in severe ranges.   Potential for opioid withdrawal- family endorses that patient takes large amount of percocet scheduled for LE pain.  - restart percocet and ativan PRN for withdrawal symptoms  Gallbladder disease- - general surgery signed off. Does not recommend urgent surgical intervention - analgesia PRN and nutritional support   Hypokalemia   hypernatremia- improving  Secondary to GI losses from nausea, vomiting and diarrhea, poor PO intake due  to encephalopathic state. Monitor for refeeding syndrome now that getting NG tube feeds. K+ 2.7>2.6>3.2>3.9>3.7, Na+  149>154>151>146, Mg++ 1.6 Cr remaining stable wnl - Supplement potassium, Mg++ PRN - BMP in afternoon  and am - LR with K+ supplement > D5 with K+ and increased rate for slow correction - nephrology following, appreciate recs   UTI (urinary tract infection)- urine culture showing e coli with sensitivity to CTX. - continue Rocephin x7 days   Enterocolitis- (present on admission) cdiff and GI pathogen panel both negative. Likely viral vs inflammatory - supportive care and treat underlying conditions - dc flagyl  Body mass index is 20.09 kg/m.  VTE ppx: heparin gttapixaban (ELIQUIS) tablet 5 mg  > eliquis  Diet:     Diet   DIET DYS 3 Room service appropriate? Yes; Fluid consistency: Thin   Subjective 09/12/21    Pt reports nothing as she is not verbally responsive today. She opens eyes and appears to be able to recognize family   Objective   Vitals:   09/11/21 2000 09/12/21 0354 09/12/21 0603 09/12/21 0822  BP:  (!) 171/93 (!) 196/86 (!) 152/67  Pulse:      Resp:  (!) 24 (!) 22 18  Temp:  98.5 F (36.9 C)  98 F (36.7 C)  TempSrc:  Axillary  Axillary  SpO2: 98% 98%  97%  Weight:      Height:        Intake/Output Summary (Last 24 hours) at 09/12/2021 0911 Last data filed at 09/11/2021 1840 Gross per 24 hour  Intake 469.83 ml  Output --  Net 469.83 ml    Filed Weights   09/07/21 1106  Weight: 63.5 kg     Physical Exam:  General: awake, alert, NAD HEENT: atraumatic, clear conjunctiva, anicteric sclera, MMM, hearing grossly normal Respiratory: normal respiratory effort. Cardiovascular: normal S1/S2, RRR, no JVD, murmurs, quick capillary refill  Gastrointestinal: soft, NT, ND Nervous: Alert and can follow simple commands. Non-verbal. Falls asleep if not actively engaged Extremities: moves all equally, no edema, normal tone Skin: dry, intact, normal temperature, normal color. No rashes, lesions or ulcers on exposed skin  Labs   I have personally reviewed the following labs and imaging studies CBC    Component Value Date/Time   WBC 17.4 (H) 09/12/2021 0425   RBC  5.24 (H) 09/12/2021 0425   HGB 16.4 (H) 09/12/2021 0425   HCT 49.6 (H) 09/12/2021 0425   PLT 206 09/12/2021 0425   MCV 94.7 09/12/2021 0425   MCH 31.3 09/12/2021 0425   MCHC 33.1 09/12/2021 0425   RDW 12.8 09/12/2021 0425   LYMPHSABS 0.7 09/07/2021 1109   MONOABS 0.5 09/07/2021 1109   EOSABS 0.0 09/07/2021 1109   BASOSABS 0.0 09/07/2021 1109   BMP Latest Ref Rng & Units 09/12/2021 09/11/2021 09/10/2021  Glucose 70 - 99 mg/dL 443(X) 540(G) 867(Y)  BUN 8 - 23 mg/dL 19(J) 09(T) 26(Z)  Creatinine 0.44 - 1.00 mg/dL 1.24 5.80 9.98  Sodium 135 - 145 mmol/L 146(H) 151(H) 152(H)  Potassium 3.5 - 5.1 mmol/L 3.7 3.9 3.8  Chloride 98 - 111 mmol/L 109 112(H) 117(H)  CO2 22 - 32 mmol/L 28 26 26   Calcium 8.9 - 10.3 mg/dL ) 9.1 9.0    DG Abd 1 View  Result Date: 09/11/2021 CLINICAL DATA:  Status post nasogastric tube placement. EXAM: ABDOMEN - 1 VIEW COMPARISON:  None. FINDINGS: A nasogastric tube is seen with its distal end looped within the body of the stomach. The distal tip  overlies expected region of the gastric fundus. The bowel gas pattern is normal. No radio-opaque calculi or other significant radiographic abnormality are seen. IMPRESSION: Nasogastric tube positioning, as described above. Electronically Signed   By: Aram Candela M.D.   On: 09/11/2021 23:37   CT HEAD WO CONTRAST ( )  Result Date: 09/11/2021 CLINICAL DATA:  Altered mental status EXAM: CT HEAD WITHOUT CONTRAST TECHNIQUE: Contiguous axial images were obtained from the base of the skull through the vertex without intravenous contrast. RADIATION DOSE REDUCTION: This exam was performed according to the departmental dose-optimization program which includes automated exposure control, adjustment of the mA and/or kV according to patient size and/or use of iterative reconstruction technique. COMPARISON:  09/07/2021 FINDINGS: Brain: No evidence of acute infarction, hemorrhage, hydrocephalus, extra-axial collection or mass lesion/mass  effect. Mild atrophic and chronic white matter ischemic changes are noted. Vascular: No hyperdense vessel or unexpected calcification. Skull: Normal. Negative for fracture or focal lesion. Sinuses/Orbits: No acute finding. Other: None. IMPRESSION: Chronic atrophic and ischemic changes without acute abnormality. Electronically Signed   By: Alcide Clever M.D.   On: 09/11/2021 23:06    Disposition Plan & Communication  Patient status: Inpatient  Admitted From: Home Planned disposition location: SNF in [facility] [place] Anticipated discharge date: 3/10 pending further medical evaluation  Family Communication: daughter and granddaughter at bedside   Author: Leeroy Bock, DO [facility] 09/12/2021, 9:11 AM   Available by Epic secure chat 7AM-7PM. If 7PM-7AM, please contact night-coverage.  [facility] contact information found on [email or web address].

## 2021-09-12 NOTE — Progress Notes (Signed)
carvedilol, HCTZ, lisinopril and spironolactone; given @ 6:30AM. Unable to document administration few hours early; notified day shift nurse.  ?

## 2021-09-12 NOTE — Progress Notes (Addendum)
[facility]  ROUNDING NOTE   Subjective:   Patient seen resting quietly Daughter and granddaughter at bedside States patient had a uncomfortable night with placement of NG tube Poor appetite remains due to difficulty swallowing  Sodium improved to 146  Objective:  Vital signs in last 24 hours:  Temp:  [97.3 F (36.3 C)-98.5 F (36.9 C)] 98.4 F (36.9 C) (03/06 1400) Pulse Rate:  [66-78] 66 (03/06 1400) Resp:  [15-24] 17 (03/06 1400) BP: (152-196)/(67-93) 178/87 (03/06 1400) SpO2:  [95 %-98 %] 96 % (03/06 1400)  Weight change:  Filed Weights   09/07/21 1106  Weight: 63.5 kg    Intake/Output: I/O last 3 completed shifts: In: 469.8 [I.V.:369.8; IV Piggyback:100] Out: -    Intake/Output this shift:  Total I/O In: 0  Out: 1 [Urine:1]  Physical Exam: General: NAD  Head: Normocephalic, atraumatic. Moist oral mucosal membranes, NGT  Eyes: Anicteric  Lungs:  Clear to auscultation, normal effort  Heart: Regular rate and rhythm  Abdomen:  Soft, nontender  Extremities: No peripheral edema.  Neurologic: Alert, following simple commands  Skin: No lesions  Access: None    Basic Metabolic Panel: Recent Labs  Lab 09/07/21 1109 09/08/21 0336 09/08/21 0338 09/08/21 1259 09/09/21 0554 09/10/21 0846 09/11/21 0544 09/12/21 0425 09/12/21 1304  NA 144 149*  --   --  154* 152* 151* 146*  --   K 2.7* 2.6*  --  3.4* 3.2* 3.8 3.9 3.7  --   CL 104 111  --   --  117* 117* 112* 109  --   CO2 23 22  --   --  27 26 26 28   --   GLUCOSE 117* 102*  --   --  126* 149* 138* 127*  --   BUN 24* 25*  --   --  31* 33* 30* 26*  --   CREATININE 0.92 0.89  --   --  0.87 0.88 0.83 0.75  --   CALCIUM 9.4 8.5*  --   --  9.2 9.0 9.1 8.7*  --   MG 1.9  --  1.9  --   --  2.0  --   --  1.6*  PHOS  --   --   --   --   --   --   --   --  3.1     Liver Function Tests: Recent Labs  Lab 09/07/21 1109 09/09/21 0554 09/11/21 0544 09/12/21 0425  AST 36 21 19 16   ALT 25 18 12 13    ALKPHOS 144* 113 84 61  BILITOT 0.5 0.8 0.8 0.6  PROT 7.6 7.6 6.8 6.5  ALBUMIN 3.2* 3.2* 2.8* 2.8*    Recent Labs  Lab 09/07/21 1109  LIPASE 26    No results for input(s): AMMONIA in the last 168 hours.  CBC: Recent Labs  Lab 09/07/21 1109 09/08/21 0336 09/09/21 0554 09/10/21 0546 09/11/21 0544 09/12/21 0425  WBC 13.7*   13.6* 13.4* 15.5* 18.3* 19.6* 17.4*  NEUTROABS 12.2*  --   --   --   --   --   HGB 16.2*   16.3* 14.8 17.1* 17.2* 17.6* 16.4*  HCT 47.1*   47.7* 43.6 50.6* 50.9* 53.1* 49.6*  MCV 92.9   93.2 93.2 93.9 94.4 94.8 94.7  PLT 263   259 264 319 315 265 206     Cardiac Enzymes: No results for input(s): CKTOTAL, CKMB, CKMBINDEX, TROPONINI in the last 168 hours.  BNP: Invalid input(s): POCBNP  CBG: Recent Labs  Lab 09/08/21 0239 09/11/21 1732 09/12/21 0600 09/12/21 1125  GLUCAP 108* 121* 135* 128*     Microbiology: Results for orders placed or performed during the hospital encounter of 09/07/21  Blood culture (single)     Status: None   Collection Time: 09/07/21 11:09 AM   Specimen: BLOOD LEFT ARM  Result Value Ref Range Status   Specimen Description BLOOD LEFT ARM  Final   Special Requests   Final    BOTTLES DRAWN AEROBIC AND ANAEROBIC Blood Culture adequate volume   Culture   Final    NO GROWTH 5 DAYS Performed at [facility], [address] [place], [place] [address]    Report Status 09/12/2021 FINAL  Final  Resp Panel by RT-PCR (Flu A&B, Covid) Nasopharyngeal Swab     Status: None   Collection Time: 09/07/21 11:09 AM   Specimen: Nasopharyngeal Swab; Nasopharyngeal(NP) swabs in vial transport medium  Result Value Ref Range Status   SARS Coronavirus 2 by RT PCR NEGATIVE NEGATIVE Final    Comment: (NOTE) SARS-CoV-2 target nucleic acids are NOT DETECTED.  The SARS-CoV-2 RNA is generally detectable in upper respiratory specimens during the acute phase of infection. The lowest concentration of SARS-CoV-2 viral copies this  assay can detect is 138 copies/mL. A negative result does not preclude SARS-Cov-2 infection and should not be used as the sole basis for treatment or other patient management decisions. A negative result may occur with  improper specimen collection/handling, submission of specimen other than nasopharyngeal swab, presence of viral mutation(s) within the areas targeted by this assay, and inadequate number of viral copies(<138 copies/mL). A negative result must be combined with clinical observations, patient history, and epidemiological information. The expected result is Negative.  Fact Sheet for Patients:  EntrepreneurPulse.com.au  Fact Sheet for Healthcare Providers:  IncredibleEmployment.be  This test is no t yet approved or cleared by the [place] FDA and  has been authorized for detection and/or diagnosis of SARS-CoV-2 by FDA under an Emergency Use Authorization (EUA). This EUA will remain  in effect (meaning this test can be used) for the duration of the COVID-19 declaration under Section 564(b)(1) of the Act, 21 U.S.C.section 360bbb-3(b)(1), unless the authorization is terminated  or revoked sooner.       Influenza A by PCR NEGATIVE NEGATIVE Final   Influenza B by PCR NEGATIVE NEGATIVE Final    Comment: (NOTE) The Xpert Xpress SARS-CoV-2/FLU/RSV plus assay is intended as an aid in the diagnosis of influenza from Nasopharyngeal swab specimens and should not be used as a sole basis for treatment. Nasal washings and aspirates are unacceptable for Xpert Xpress SARS-CoV-2/FLU/RSV testing.  Fact Sheet for Patients: EntrepreneurPulse.com.au  Fact Sheet for Healthcare Providers: IncredibleEmployment.be  This test is not yet approved or cleared by the [place] FDA and has been authorized for detection and/or diagnosis of SARS-CoV-2 by FDA under an Emergency Use Authorization (EUA). This EUA will  remain in effect (meaning this test can be used) for the duration of the COVID-19 declaration under Section 564(b)(1) of the Act, 21 U.S.C. section 360bbb-3(b)(1), unless the authorization is terminated or revoked.  Performed at [facility], [address] [place], [place] [address]   Urine Culture     Status: Abnormal   Collection Time: 09/07/21 11:23 AM   Specimen: Urine, Clean Catch  Result Value Ref Range Status   Specimen Description   Final    URINE, CLEAN CATCH Performed at [facility], [address] [place], [place] [address]    Special Requests   Final    NONE Performed at [facility], [address] [place], [place] [address]    Culture >=100,000 COLONIES/mL ESCHERICHIA COLI (A)  Final   Report Status 09/11/2021 FINAL  Final   Organism ID, Bacteria ESCHERICHIA COLI (A)  Final      Susceptibility   Escherichia coli - MIC*    AMPICILLIN >=32 RESISTANT Resistant     CEFAZOLIN <=4 SENSITIVE Sensitive     CEFEPIME <=0.12 SENSITIVE Sensitive     CEFTRIAXONE <=0.25 SENSITIVE Sensitive     CIPROFLOXACIN <=0.25 SENSITIVE Sensitive     GENTAMICIN <=1 SENSITIVE Sensitive     IMIPENEM <=0.25 SENSITIVE Sensitive     NITROFURANTOIN <=16 SENSITIVE Sensitive     TRIMETH/SULFA <=20 SENSITIVE Sensitive     AMPICILLIN/SULBACTAM >=32 RESISTANT Resistant     PIP/TAZO <=4 SENSITIVE Sensitive     * >=100,000 COLONIES/mL ESCHERICHIA COLI  C Difficile Quick Screen w PCR reflex     Status: None   Collection Time: 09/07/21  7:21 PM   Specimen: STOOL  Result Value Ref Range Status   C Diff antigen NEGATIVE NEGATIVE Final   C Diff toxin NEGATIVE NEGATIVE Final   C Diff interpretation No C. difficile detected.  Final    Comment: Performed at [facility], [address] [place], [place] [address]  Gastrointestinal Panel by PCR , Stool     Status: None   Collection Time: 09/07/21  7:21 PM   Specimen: STOOL  Result Value Ref Range Status    Campylobacter species NOT DETECTED NOT DETECTED Final   Plesimonas shigelloides NOT DETECTED NOT DETECTED Final   Salmonella species NOT DETECTED NOT DETECTED Final   Yersinia enterocolitica NOT DETECTED NOT DETECTED Final   Vibrio species NOT DETECTED NOT DETECTED Final   Vibrio cholerae NOT DETECTED NOT DETECTED Final   Enteroaggregative E coli (EAEC) NOT DETECTED NOT DETECTED Final   Enteropathogenic E coli (EPEC) NOT DETECTED NOT DETECTED Final   Enterotoxigenic E coli (ETEC) NOT DETECTED NOT DETECTED Final   Shiga like toxin producing E coli (STEC) NOT DETECTED NOT DETECTED Final   Shigella/Enteroinvasive E coli (EIEC) NOT DETECTED NOT DETECTED Final   Cryptosporidium NOT DETECTED NOT DETECTED Final   Cyclospora cayetanensis NOT DETECTED NOT DETECTED Final   Entamoeba histolytica NOT DETECTED NOT DETECTED Final   Giardia lamblia NOT DETECTED NOT DETECTED Final   Adenovirus F40/41 NOT DETECTED NOT DETECTED Final   Astrovirus NOT DETECTED NOT DETECTED Final   Norovirus GI/GII NOT DETECTED NOT DETECTED Final   Rotavirus A NOT DETECTED NOT DETECTED Final   Sapovirus (I, II, IV, and V) NOT DETECTED NOT DETECTED Final    Comment: Performed at [facility], [address] [place], [place] [address]    Coagulation Studies: No results for input(s): LABPROT, INR in the last 72 hours.   Urinalysis: No results for input(s): COLORURINE, LABSPEC, PHURINE, GLUCOSEU, HGBUR, BILIRUBINUR, KETONESUR, PROTEINUR, UROBILINOGEN, NITRITE, LEUKOCYTESUR in the last 72 hours.  Invalid input(s): APPERANCEUR    Imaging: DG Abd 1 View  Result Date: 09/11/2021 CLINICAL DATA:  Status post nasogastric tube placement. EXAM: ABDOMEN - 1 VIEW COMPARISON:  None. FINDINGS: A nasogastric tube is seen with its distal end looped within the body of the stomach. The distal tip overlies expected region of the gastric fundus. The bowel gas pattern is normal. No radio-opaque calculi or other significant  radiographic abnormality are seen. IMPRESSION:  Nasogastric tube positioning, as described above. Electronically Signed   By: Virgina Norfolk M.D.   On: 09/11/2021 23:37   CT HEAD WO CONTRAST (5MM)  Result Date: 09/11/2021 CLINICAL DATA:  Altered mental status EXAM: CT HEAD WITHOUT CONTRAST TECHNIQUE: Contiguous axial images were obtained from the base of the skull through the vertex without intravenous contrast. RADIATION DOSE REDUCTION: This exam was performed according to the departmental dose-optimization program which includes automated exposure control, adjustment of the mA and/or kV according to patient size and/or use of iterative reconstruction technique. COMPARISON:  09/07/2021 FINDINGS: Brain: No evidence of acute infarction, hemorrhage, hydrocephalus, extra-axial collection or mass lesion/mass effect. Mild atrophic and chronic white matter ischemic changes are noted. Vascular: No hyperdense vessel or unexpected calcification. Skull: Normal. Negative for fracture or focal lesion. Sinuses/Orbits: No acute finding. Other: None. IMPRESSION: Chronic atrophic and ischemic changes without acute abnormality. Electronically Signed   By: Inez Catalina M.D.   On: 09/11/2021 23:06     Medications:    cefTRIAXone (ROCEPHIN)  IV 2 g (09/12/21 1131)   dextrose 5 % with KCl 20 mEq / L 20 mEq (09/12/21 1048)   feeding supplement (OSMOLITE 1.5 CAL) 1,000 mL (09/12/21 1336)    apixaban  5 mg Oral BID   atorvastatin  40 mg Oral Daily   carvedilol  25 mg Oral BID WC   feeding supplement (PROSource TF)  45 mL Per Tube BID   [START ON 09/13/2021] free water  150 mL Per Tube Q4H   hydrochlorothiazide  12.5 mg Oral Daily   lisinopril  40 mg Oral Daily   methimazole  5 mg Oral Daily   pantoprazole (PROTONIX) IV  40 mg Intravenous Q24H   spironolactone  50 mg Oral Daily   albuterol, ondansetron **OR** ondansetron (ZOFRAN) IV, oxyCODONE-acetaminophen  Assessment/ Plan:  Ms. Joy Clark is a 72 y.o.  female a  PMH significant for nicotine dependence, chronic pain syndrome, HLD who was brought to the ER with chief complaint of altered mental status.  Hypernatremia- secondary to GI losses, and sensibility losses and inability to ask for water as patient has altered mental status.  Patient appears to have a water deficit therefore sodium level is highly concentrated.   Sodium has improved to 146.  Due to poor oral intake, will continue IV fluids at current rate.  NG tube placed to receive tube feeds.  We will continue to monitor daily  2.  Hypertension blood pressure remains elevated 178/87 Currently receiving carvedilol 25 mg twice daily, hydrochlorothiazide 12.5 mg daily, lisinopril 40 mg daily and spironolactone 50 mg daily.  Monitoring - d/c HCTZ  3.  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  Currently 16.4  4.  Chronic diastolic heart failure echo completed on 09/09/2021 shows EF 60 to 65% with a grade 1 diastolic dysfunction.  We will continue to monitor IV fluids with attempts to avoid fluid overload.  5.  Urinary tract infection.  Patient currently prescribed ceftriaxone     LOS: 5 Joy Clark 3/6/20233:17 PM

## 2021-09-12 NOTE — Care Management Important Message (Signed)
Important Message ? ?Patient Details  ?Name: Joy Clark ?MRN: [id number] ?Date of Birth: [birth year] ? ? ?Medicare Important Message Given:  Yes ? ? ? ? ?[name] ?09/12/2021, 12:04 PM ?

## 2021-09-12 NOTE — Progress Notes (Addendum)
Initial Nutrition Assessment ? ?DOCUMENTATION CODES:  ? ?Non-severe (moderate) malnutrition in context of acute illness/injury ? ?INTERVENTION:  ? ?Initiate Osmolite 1.5 @ 25 ml/hr via NGT and increase by 10 ml every 12 hours to goal rate of 55 ml/hr.  ? ?45 ml Prosource TF BID. ? ?150 ml free water every 4 hours   ? ?Tube feeding regimen provides 2060 kcal (100% of needs), 105 grams of protein, and 1006 ml of H2O. Total free water: 1906 ml daily ? ?-Monitor Mg, K, and Phos daily and replete as needed due to high refeeding risk ? ?NUTRITION DIAGNOSIS:  ? ?Moderate Malnutrition related to acute illness (acute metabolic encepahlopathy) as evidenced by energy intake < or equal to 50% for > or equal to 5 days, mild fat depletion, moderate fat depletion, mild muscle depletion, moderate muscle depletion. ? ?GOAL:  ? ?Patient will meet greater than or equal to 90% of their needs ? ?MONITOR:  ? ?PO intake, Supplement acceptance, Diet advancement, Labs, Weight trends, TF tolerance, Skin, I & O's ? ?REASON FOR ASSESSMENT:  ? ?Low [place], Consult ?Enteral/tube feeding initiation and management ? ?ASSESSMENT:  ? ?Joy Clark is a 71 y.o. female with medical history significant for nicotine dependence, chronic pain syndrome, dyslipidemia who was brought into the ER by EMS for evaluation of mental status changes for about 4 days.  Patient is visiting her daughter here in [place] and has been here for about 4 weeks.  At baseline she is usually awake, alert and oriented to person place and time.  Patient's daughter states that her symptoms started about 4 days ago with chills and confusion.  Since then she has had multiple episodes of emesis and diarrhea and is now incontinent of stool which is loose and has lots of mucus in it.  Patient's oral intake has been very poor over the last 4 days as well.  She is awake and oriented to person but is unable to follow commands. ? ?Pt admitted with acute metabolic encephalopathy.   ? ?3/2- s/p BSE- advanced to full liquid diet ?3/3- s/p BSE- advanced to regular diet ?3/5- NGT placed (placement confirmed in stomach) ? ?Reviewed I/O's: +470 ml x 24 hours and +3 L since admission ? ?Pt very lethargic at time of visit, but did open her eyes when RD greeted her. History provided from daughter and granddaughter at bedside. Pt family reports that pt was very independent and active PTA. She was consuming 3 meals per day and ate whatever she wanted, including a 12 pack of coke daily. Noted pt with poor dentition and multiple missing teeth; pt daughter shares that pt was considering getting dental work done. This did not impact her ability to chew and swallow food. Pt does have history of intermittent constipation, due to pain management regimen, however, is able to control this with laxatives.  ? ?Pt daughter unsure of UBW, but shares that she is sure that she has lost weight within the past week. Pt has had instances of dizzy spells, which have contributed to more frequent falls. Pt also with very variable mentation; pt is sometimes able to hold a conversation and daughter reports pt cursing out staff the other day. Pt's mental status is the largest barrier to adequate oral intake. Pt with very minimal intake since hospitalization.  ? ?Discussed how pt will receive nutrition via NGT. Family agreeable to plan.  ? ?Medications reviewed and include aldactone and dextrose 5% with KCl 20 mEq/L infusion.  ?  ?Labs  reviewed: Na: 146, CBGS: 121-135 (inpatient orders for glycemic control are none).   ? ?NUTRITION - FOCUSED PHYSICAL EXAM: ? ?Flowsheet Row Most Recent Value  ?Orbital Region Mild depletion  ?Upper Arm Region Moderate depletion  ?Thoracic and Lumbar Region Mild depletion  ?Buccal Region Mild depletion  ?Temple Region Mild depletion  ?Clavicle Bone Region Moderate depletion  ?Clavicle and Acromion Bone Region Moderate depletion  ?Scapular Bone Region Moderate depletion  ?Dorsal Hand Mild depletion   ?Patellar Region Moderate depletion  ?Anterior Thigh Region Moderate depletion  ?Posterior Calf Region Moderate depletion  ?Edema (RD Assessment) None  ?Hair Reviewed  ?Eyes Reviewed  ?Mouth Reviewed  ?Skin Reviewed  ?Nails Reviewed  ? ?  ? ? ?Diet Order:   ?Diet Order   ? ?       ?  DIET DYS 3 Room service appropriate? Yes; Fluid consistency: Thin  Diet effective now       ?  ? ?  ?  ? ?  ? ? ?EDUCATION NEEDS:  ? ?Education needs have been addressed ? ?Skin:  Skin Assessment: Reviewed RN Assessment ? ?Last BM:  09/11/21 (type 7) ? ?Height:  ? ?Ht Readings from Last 1 Encounters:  ?09/07/21 5\' 10"  (1.778 m)  ? ? ?Weight:  ? ?Wt Readings from Last 1 Encounters:  ?09/07/21 63.5 kg  ? ? ?Ideal Body Weight:  68.2 kg ? ?BMI:  Body mass index is 20.09 kg/m?. ? ?Estimated Nutritional Needs:  ? ?Kcal:  1900-2100 ? ?Protein:  95-110 grams ? ?Fluid:  > 1.9 L ? ? ? ?11/07/21, RD, LDN, CDCES ?Registered Dietitian II ?Certified Diabetes Care and Education Specialist ?Please refer to [facility] for RD and/or RD on-call/weekend/after hours pager  ?

## 2021-09-12 NOTE — Progress Notes (Signed)
Physical Therapy Treatment ?Patient Details ?Name: Joy Clark ?MRN: [id number] ?DOB: [birth year] ?Today's Date: 09/12/2021 ? ? ?History of Present Illness Joy Clark is a 71 y.o. female with medical history significant for nicotine dependence, chronic pain syndrome, dyslipidemia who was brought into the ER by EMS for evaluation of mental status changes for about 4 days. ? ?  ?PT Comments  ? ? Patient received in bed with OT present getting patient to get to edge of bed. Patient minimally responsive during session. Not verbal. Very weak and lethargic. Patient requires max +2 for sit to stand and to transfer bed to recliner. She will continue to benefit from skilled PT while here to improve strength, independence and safety.  ?    ?Recommendations for follow up therapy are one component of a multi-disciplinary discharge planning process, led by the attending physician.  Recommendations may be updated based on patient status, additional functional criteria and insurance authorization. ? ?Follow Up Recommendations ? Skilled nursing-short term rehab (<3 hours/day) ?  ?  ?Assistance Recommended at Discharge Frequent or constant Supervision/Assistance  ?Patient can return home with the following Two people to help with walking and/or transfers;Two people to help with bathing/dressing/bathroom;Direct supervision/assist for medications management;Assistance with feeding;Assistance with cooking/housework;Assist for transportation;Help with stairs or ramp for entrance ?  ?Equipment Recommendations ? None recommended by PT (TBD)  ?  ?Recommendations for Other Services   ? ? ?  ?Precautions / Restrictions Precautions ?Precautions: Fall ?Precaution Comments: Aspiration ?Restrictions ?Weight Bearing Restrictions: No  ?  ? ?Mobility ? Bed Mobility ?  ?  ?  ?  ?  ?  ?  ?General bed mobility comments: patient received sitting edge of bed with OT present. No attempt to use UEs to assist with transfer. Requires close supervision while  sitting edge of bed due to poor sitting balance. ?  ? ?Transfers ?Overall transfer level: Needs assistance ?Equipment used: 2 person hand held assist ?Transfers: Sit to/from Stand, Bed to chair/wheelchair/BSC ?Sit to Stand: Mod assist, +2 physical assistance ?Stand pivot transfers: +2 physical assistance, Max assist ?  ?  ?  ?  ?General transfer comment: Patient able to stand edge of bed, unable to achieve upright standing and unable to move legs to walk to recliner. Performed stand pivot with chair closer to patient with +2 max assist. ?  ? ?Ambulation/Gait ?Ambulation/Gait assistance: Max assist, +2 physical assistance ?  ?Assistive device: 2 person hand held assist ?  ?Gait velocity: decr ?  ?  ?General Gait Details: Max +2 assist for standing balance, able to move feet minimally going from bed to recliner ? ? ?Stairs ?  ?  ?  ?  ?  ? ? ?Wheelchair Mobility ?  ? ?Modified Rankin (Stroke Patients Only) ?  ? ? ?  ?Balance Overall balance assessment: Needs assistance ?Sitting-balance support: Feet supported ?Sitting balance-[name]: Fair ?  ?Postural control: Other (comment) (leaning forward in flexed posture) ?Standing balance support: During functional activity, Bilateral upper extremity supported ?Standing balance-[name]: Poor ?Standing balance comment: max +2 for standing ?  ?  ?  ?  ?  ?  ?  ?  ?  ?  ?  ?  ? ?  ?Cognition Arousal/Alertness: Lethargic ?Behavior During Therapy: Flat affect ?Overall Cognitive Status: Impaired/Different from baseline ?  ?  ?  ?  ?  ?  ?  ?  ?  ?  ?  ?  ?  ?  ?  ?  ?General Comments: pt does  not speak during session, but able to participate minimally with multimodal cues; follows ~50% of simple 1-step commands, increased time for processing ?  ?  ? ?  ?Exercises   ? ?  ?General Comments   ?  ?  ? ?Pertinent Vitals/Pain Pain Assessment ?Pain Assessment: No/denies pain  ? ? ?Home Living   ?  ?  ?  ?  ?  ?  ?  ?  ?  ?   ?  ?Prior Function    ?  ?  ?   ? ?PT Goals (current  goals can now be found in the care plan section) Acute Rehab PT Goals ?Patient Stated Goal: "go home" ?PT Goal Formulation: With family ?Time For Goal Achievement: 09/24/21 ?Potential to Achieve Goals: Fair ?Progress towards PT goals: Progressing toward goals ? ?  ?Frequency ? ? ? Min 2X/week ? ? ? ?  ?PT Plan Current plan remains appropriate  ? ? ?Co-evaluation   ?  ?  ?  ?  ? ?  ?AM-PAC PT "6 Clicks" Mobility   ?Outcome Measure ? Help needed turning from your back to your side while in a flat bed without using bedrails?: A Lot ?Help needed moving from lying on your back to sitting on the side of a flat bed without using bedrails?: Total ?Help needed moving to and from a bed to a chair (including a wheelchair)?: Total ?Help needed standing up from a chair using your arms (e.g., wheelchair or bedside chair)?: A Lot ?Help needed to walk in hospital room?: Total ?Help needed climbing 3-5 steps with a railing? : Total ?6 Click Score: 8 ? ?  ?End of Session Equipment Utilized During Treatment: Gait belt;Oxygen ?Activity Tolerance: Patient limited by fatigue;Patient limited by lethargy ?Patient left: in chair;with call bell/phone within reach;with chair alarm set;with family/visitor present ?Nurse Communication: Mobility status ?PT Visit Diagnosis: Muscle weakness (generalized) (M62.81);Other abnormalities of gait and mobility (R26.89);Unsteadiness on feet (R26.81) ?  ? ? ?Time: [id number] ?PT Time Calculation (min) (ACUTE ONLY): 10 min ? ?Charges:  $Therapeutic Activity: 8-22 mins          ?          ? ?Amanda Cockayne, PT, GCS ?09/12/21,12:37 PM ? ?

## 2021-09-12 NOTE — Telephone Encounter (Signed)
Spoke to patient's daughter, Burnell Blanks.  ?She stated that she would have patient call back to schedule appt.  ? ?

## 2021-09-12 NOTE — Telephone Encounter (Signed)
-----   Message from Will Jorja Loa, MD sent at 09/10/2021 11:57 AM EST ----- ?Patient will need 4 week appt for AF hospital follow up ?

## 2021-09-12 NOTE — TOC Progression Note (Signed)
Transition of Care (TOC) - Progression Note  ? ? ?Patient Details  ?Name: Joy Clark ?MRN: [id number] ?Date of Birth: [birth year] ? ?Transition of Care (TOC) CM/SW Contact  ?Candie Chroman, LCSW ?Phone Number: ?09/12/2021, 12:21 PM ? ?Clinical Narrative:   Met with patient's daughter Joy Clark [phone]) at bedside. Discussed estimated cost for non-emergency ambulance to SNF in [place]. [facility] is unable to transport that far. [facility] said estimated cost would be $27,723.18. [facility] total cost would be at least $23,000 plus extra crew, oxygen, etc. [facility] said their cost would be around this much as well. Daughter agreeable to local SNF search and then patient will return home with her in [place] once strong enough for plane or car ride. Patient currently with NG tube. Will wait to send out SNF referral until that is out.  ? ?Expected Discharge Plan: [place] ?Barriers to Discharge: Continued Medical Work up ? ?Expected Discharge Plan and Services ?Expected Discharge Plan: [place] ?  ?  ?  ?  ?                ?  ?  ?  ?  ?  ?  ?  ?  ?  ?  ? ? ?Social Determinants of Health (SDOH) Interventions ?  ? ?Readmission Risk Interventions ?No flowsheet data found. ? ?

## 2021-09-12 NOTE — Progress Notes (Signed)
Received order from MD "ok to use NG tube".  ?

## 2021-09-13 DIAGNOSIS — R1310 Dysphagia, unspecified: Secondary | ICD-10-CM

## 2021-09-13 DIAGNOSIS — G9341 Metabolic encephalopathy: Secondary | ICD-10-CM | POA: Diagnosis not present

## 2021-09-13 DIAGNOSIS — E440 Moderate protein-calorie malnutrition: Secondary | ICD-10-CM

## 2021-09-13 DIAGNOSIS — E860 Dehydration: Secondary | ICD-10-CM | POA: Diagnosis not present

## 2021-09-13 DIAGNOSIS — R4182 Altered mental status, unspecified: Secondary | ICD-10-CM | POA: Diagnosis not present

## 2021-09-13 DIAGNOSIS — R932 Abnormal findings on diagnostic imaging of liver and biliary tract: Secondary | ICD-10-CM | POA: Diagnosis not present

## 2021-09-13 LAB — BASIC METABOLIC PANEL
Anion gap: 10 (ref 5–15)
BUN: 25 mg/dL — ABNORMAL HIGH (ref 8–23)
CO2: 28 mmol/L (ref 22–32)
Calcium: 8.8 mg/dL — ABNORMAL LOW (ref 8.9–10.3)
Chloride: 102 mmol/L (ref 98–111)
Creatinine, Ser: 0.68 mg/dL (ref 0.44–1.00)
GFR, Estimated: >60 mL/min (ref >60)
Glucose, Bld: 146 mg/dL — ABNORMAL HIGH (ref 70–99)
Potassium: 3.6 mmol/L (ref 3.5–5.1)
Sodium: 140 mmol/L (ref 135–145)

## 2021-09-13 LAB — CBC
HCT: 50.7 % — ABNORMAL HIGH (ref 36.0–46.0)
Hemoglobin: 17.3 g/dL — ABNORMAL HIGH (ref 12.0–15.0)
MCH: 31.5 pg (ref 26.0–34.0)
MCHC: 34.1 g/dL (ref 30.0–36.0)
MCV: 92.2 fL (ref 80.0–100.0)
Platelets: 195 10*3/uL (ref 150–400)
RBC: 5.5 MIL/uL — ABNORMAL HIGH (ref 3.87–5.11)
RDW: 12.3 % (ref 11.5–15.5)
WBC: 19.5 10*3/uL — ABNORMAL HIGH (ref 4.0–10.5)
nRBC: 0 % (ref 0.0–0.2)

## 2021-09-13 LAB — GLUCOSE, CAPILLARY
Glucose-Capillary: 136 mg/dL — ABNORMAL HIGH (ref 70–99)
Glucose-Capillary: 137 mg/dL — ABNORMAL HIGH (ref 70–99)
Glucose-Capillary: 140 mg/dL — ABNORMAL HIGH (ref 70–99)
Glucose-Capillary: 150 mg/dL — ABNORMAL HIGH (ref 70–99)
Glucose-Capillary: 157 mg/dL — ABNORMAL HIGH (ref 70–99)
Glucose-Capillary: 160 mg/dL — ABNORMAL HIGH (ref 70–99)
Glucose-Capillary: 177 mg/dL — ABNORMAL HIGH (ref 70–99)

## 2021-09-13 LAB — MAGNESIUM
Magnesium: 1.8 mg/dL (ref 1.7–2.4)
Magnesium: 1.8 mg/dL (ref 1.7–2.4)

## 2021-09-13 LAB — PHOSPHORUS
Phosphorus: 2.8 mg/dL (ref 2.5–4.6)
Phosphorus: 2.6 mg/dL (ref 2.5–4.6)

## 2021-09-13 NOTE — Progress Notes (Signed)
[facility]  ROUNDING NOTE   Subjective:   Patient resting quietly Son and daughter at bedside States she is responding to simple questions Delayed responses  NGT in place with tube feeds at 29ml/hr Sodium 140  Objective:  Vital signs in last 24 hours:  Temp:  [97.8 F (36.6 C)-98.1 F (36.7 C)] 98.1 F (36.7 C) (03/07 1145) Pulse Rate:  [73-79] 73 (03/07 1145) Resp:  [24-32] 24 (03/07 1145) BP: (116-161)/(65-87) 116/65 (03/07 1145) SpO2:  [93 %-95 %] 93 % (03/07 1145) Weight:  [48.8 kg] 48.8 kg (03/07 0400)  Weight change:  Filed Weights   09/07/21 1106 09/13/21 0400  Weight: 63.5 kg 48.8 kg    Intake/Output: I/O last 3 completed shifts: In: 60 [NG/GT:60] Out: 1 [Urine:1]   Intake/Output this shift:  Total I/O In: 3552.5 [I.V.:2332; NG/GT:1115.5; IV Piggyback:105] Out: 1 [Stool:1]  Physical Exam: General: NAD  Head: Normocephalic, atraumatic. Moist oral mucosal membranes, NGT  Eyes: Anicteric  Lungs:  Clear to auscultation, normal effort  Heart: Regular rate and rhythm  Abdomen:  Soft, nontender  Extremities: No peripheral edema.  Neurologic: Alert, following simple commands  Skin: No lesions  Access: None    Basic Metabolic Panel: Recent Labs  Lab 09/08/21 0338 09/08/21 1259 09/09/21 0554 09/10/21 0846 09/11/21 0544 09/12/21 0425 09/12/21 1304 09/12/21 1715 09/13/21 0619  NA  --   --  154* 152* 151* 146*  --   --  140  K  --    < > 3.2* 3.8 3.9 3.7  --   --  3.6  CL  --   --  117* 117* 112* 109  --   --  102  CO2  --   --  27 26 26 28   --   --  28  GLUCOSE  --   --  126* 149* 138* 127*  --   --  146*  BUN  --   --  31* 33* 30* 26*  --   --  25*  CREATININE  --   --  0.87 0.88 0.83 0.75  --   --  0.68  CALCIUM  --   --  9.2 9.0 9.1 8.7*  --   --  8.8*  MG 1.9  --   --  2.0  --   --  1.6* 1.7 1.8  PHOS  --   --   --   --   --   --  3.1 2.9 2.8   < > = values in this interval not displayed.     Liver Function Tests: Recent  Labs  Lab 09/07/21 1109 09/09/21 0554 09/11/21 0544 09/12/21 0425  AST 36 21 19 16   ALT 25 18 12 13   ALKPHOS 144* 113 84 61  BILITOT 0.5 0.8 0.8 0.6  PROT 7.6 7.6 6.8 6.5  ALBUMIN 3.2* 3.2* 2.8* 2.8*    Recent Labs  Lab 09/07/21 1109  LIPASE 26    No results for input(s): AMMONIA in the last 168 hours.  CBC: Recent Labs  Lab 09/07/21 1109 09/08/21 0336 09/09/21 0554 09/10/21 0546 09/11/21 0544 09/12/21 0425 09/13/21 0619  WBC 13.7*   13.6*   < > 15.5* 18.3* 19.6* 17.4* 19.5*  NEUTROABS 12.2*  --   --   --   --   --   --   HGB 16.2*   16.3*   < > 17.1* 17.2* 17.6* 16.4* 17.3*  HCT 47.1*   47.7*   < > 50.6*  50.9* 53.1* 49.6* 50.7*  MCV 92.9   93.2   < > 93.9 94.4 94.8 94.7 92.2  PLT 263   259   < > 319 315 265 206 195   < > = values in this interval not displayed.     Cardiac Enzymes: No results for input(s): CKTOTAL, CKMB, CKMBINDEX, TROPONINI in the last 168 hours.  BNP: Invalid input(s): POCBNP  CBG: Recent Labs  Lab 09/12/21 2008 09/13/21 0200 09/13/21 0426 09/13/21 0900 09/13/21 1147  GLUCAP 150* 160* 140* 177* 137*     Microbiology: Results for orders placed or performed during the hospital encounter of 09/07/21  Blood culture (single)     Status: None   Collection Time: 09/07/21 11:09 AM   Specimen: BLOOD LEFT ARM  Result Value Ref Range Status   Specimen Description BLOOD LEFT ARM  Final   Special Requests   Final    BOTTLES DRAWN AEROBIC AND ANAEROBIC Blood Culture adequate volume   Culture   Final    NO GROWTH 5 DAYS Performed at [facility], [address] [place], [place] [address]    Report Status 09/12/2021 FINAL  Final  Resp Panel by RT-PCR (Flu A&B, Covid) Nasopharyngeal Swab     Status: None   Collection Time: 09/07/21 11:09 AM   Specimen: Nasopharyngeal Swab; Nasopharyngeal(NP) swabs in vial transport medium  Result Value Ref Range Status   SARS Coronavirus 2 by RT PCR NEGATIVE NEGATIVE Final    Comment:  (NOTE) SARS-CoV-2 target nucleic acids are NOT DETECTED.  The SARS-CoV-2 RNA is generally detectable in upper respiratory specimens during the acute phase of infection. The lowest concentration of SARS-CoV-2 viral copies this assay can detect is 138 copies/mL. A negative result does not preclude SARS-Cov-2 infection and should not be used as the sole basis for treatment or other patient management decisions. A negative result may occur with  improper specimen collection/handling, submission of specimen other than nasopharyngeal swab, presence of viral mutation(s) within the areas targeted by this assay, and inadequate number of viral copies(<138 copies/mL). A negative result must be combined with clinical observations, patient history, and epidemiological information. The expected result is Negative.  Fact Sheet for Patients:  BloggerCourse.com  Fact Sheet for Healthcare Providers:  SeriousBroker.it  This test is no t yet approved or cleared by the [place] FDA and  has been authorized for detection and/or diagnosis of SARS-CoV-2 by FDA under an Emergency Use Authorization (EUA). This EUA will remain  in effect (meaning this test can be used) for the duration of the COVID-19 declaration under Section 564(b)(1) of the Act, 21 U.S.C.section 360bbb-3(b)(1), unless the authorization is terminated  or revoked sooner.       Influenza A by PCR NEGATIVE NEGATIVE Final   Influenza B by PCR NEGATIVE NEGATIVE Final    Comment: (NOTE) The Xpert Xpress SARS-CoV-2/FLU/RSV plus assay is intended as an aid in the diagnosis of influenza from Nasopharyngeal swab specimens and should not be used as a sole basis for treatment. Nasal washings and aspirates are unacceptable for Xpert Xpress SARS-CoV-2/FLU/RSV testing.  Fact Sheet for Patients: BloggerCourse.com  Fact Sheet for Healthcare  Providers: SeriousBroker.it  This test is not yet approved or cleared by the [place] FDA and has been authorized for detection and/or diagnosis of SARS-CoV-2 by FDA under an Emergency Use Authorization (EUA). This EUA will remain in effect (meaning this test can be used) for the duration of the COVID-19 declaration under Section 564(b)(1) of the Act,  21 U.S.C. section 360bbb-3(b)(1), unless the authorization is terminated or revoked.  Performed at [facility], [address] [place], [place] [address]   Urine Culture     Status: Abnormal   Collection Time: 09/07/21 11:23 AM   Specimen: Urine, Clean Catch  Result Value Ref Range Status   Specimen Description   Final    URINE, CLEAN CATCH Performed at [facility], [address], [place], [place] [address]    Special Requests   Final    NONE Performed at [facility], [address] [place], [place] [address]    Culture >=100,000 COLONIES/mL ESCHERICHIA COLI (A)  Final   Report Status 09/11/2021 FINAL  Final   Organism ID, Bacteria ESCHERICHIA COLI (A)  Final      Susceptibility   Escherichia coli - MIC*    AMPICILLIN >=32 RESISTANT Resistant     CEFAZOLIN <=4 SENSITIVE Sensitive     CEFEPIME <=0.12 SENSITIVE Sensitive     CEFTRIAXONE <=0.25 SENSITIVE Sensitive     CIPROFLOXACIN <=0.25 SENSITIVE Sensitive     GENTAMICIN <=1 SENSITIVE Sensitive     IMIPENEM <=0.25 SENSITIVE Sensitive     NITROFURANTOIN <=16 SENSITIVE Sensitive     TRIMETH/SULFA <=20 SENSITIVE Sensitive     AMPICILLIN/SULBACTAM >=32 RESISTANT Resistant     PIP/TAZO <=4 SENSITIVE Sensitive     * >=100,000 COLONIES/mL ESCHERICHIA COLI  C Difficile Quick Screen w PCR reflex     Status: None   Collection Time: 09/07/21  7:21 PM   Specimen: STOOL  Result Value Ref Range Status   C Diff antigen NEGATIVE NEGATIVE Final   C Diff toxin NEGATIVE NEGATIVE Final   C Diff interpretation No C. difficile  detected.  Final    Comment: Performed at [facility], [address] [place], [place] [address]  Gastrointestinal Panel by PCR , Stool     Status: None   Collection Time: 09/07/21  7:21 PM   Specimen: STOOL  Result Value Ref Range Status   Campylobacter species NOT DETECTED NOT DETECTED Final   Plesimonas shigelloides NOT DETECTED NOT DETECTED Final   Salmonella species NOT DETECTED NOT DETECTED Final   Yersinia enterocolitica NOT DETECTED NOT DETECTED Final   Vibrio species NOT DETECTED NOT DETECTED Final   Vibrio cholerae NOT DETECTED NOT DETECTED Final   Enteroaggregative E coli (EAEC) NOT DETECTED NOT DETECTED Final   Enteropathogenic E coli (EPEC) NOT DETECTED NOT DETECTED Final   Enterotoxigenic E coli (ETEC) NOT DETECTED NOT DETECTED Final   Shiga like toxin producing E coli (STEC) NOT DETECTED NOT DETECTED Final   Shigella/Enteroinvasive E coli (EIEC) NOT DETECTED NOT DETECTED Final   Cryptosporidium NOT DETECTED NOT DETECTED Final   Cyclospora cayetanensis NOT DETECTED NOT DETECTED Final   Entamoeba histolytica NOT DETECTED NOT DETECTED Final   Giardia lamblia NOT DETECTED NOT DETECTED Final   Adenovirus F40/41 NOT DETECTED NOT DETECTED Final   Astrovirus NOT DETECTED NOT DETECTED Final   Norovirus GI/GII NOT DETECTED NOT DETECTED Final   Rotavirus A NOT DETECTED NOT DETECTED Final   Sapovirus (I, II, IV, and V) NOT DETECTED NOT DETECTED Final    Comment: Performed at [facility], [address] [place], [place] [address]    Coagulation Studies: No results for input(s): LABPROT, INR in the last 72 hours.   Urinalysis: No results for input(s): COLORURINE, LABSPEC, PHURINE, GLUCOSEU, HGBUR, BILIRUBINUR, KETONESUR, PROTEINUR, UROBILINOGEN, NITRITE, LEUKOCYTESUR in the last 72 hours.  Invalid input(s): APPERANCEUR  Imaging: DG Abd 1 View  Result Date: 09/11/2021 CLINICAL DATA:  Status post nasogastric tube placement. EXAM: ABDOMEN - 1 VIEW  COMPARISON:  None. FINDINGS: A nasogastric tube is seen with its distal end looped within the body of the stomach. The distal tip overlies expected region of the gastric fundus. The bowel gas pattern is normal. No radio-opaque calculi or other significant radiographic abnormality are seen. IMPRESSION: Nasogastric tube positioning, as described above. Electronically Signed   By: Aram Candelahaddeus  Houston M.D.   On: 09/11/2021 23:37   CT HEAD WO CONTRAST (5MM)  Result Date: 09/11/2021 CLINICAL DATA:  Altered mental status EXAM: CT HEAD WITHOUT CONTRAST TECHNIQUE: Contiguous axial images were obtained from the base of the skull through the vertex without intravenous contrast. RADIATION DOSE REDUCTION: This exam was performed according to the departmental dose-optimization program which includes automated exposure control, adjustment of the mA and/or kV according to patient size and/or use of iterative reconstruction technique. COMPARISON:  09/07/2021 FINDINGS: Brain: No evidence of acute infarction, hemorrhage, hydrocephalus, extra-axial collection or mass lesion/mass effect. Mild atrophic and chronic white matter ischemic changes are noted. Vascular: No hyperdense vessel or unexpected calcification. Skull: Normal. Negative for fracture or focal lesion. Sinuses/Orbits: No acute finding. Other: None. IMPRESSION: Chronic atrophic and ischemic changes without acute abnormality. Electronically Signed   By: Alcide CleverMark  Lukens M.D.   On: 09/11/2021 23:06     Medications:    feeding supplement (OSMOLITE 1.5 CAL) 1,000 mL (09/13/21 0600)    apixaban  5 mg Oral BID   atorvastatin  40 mg Oral Daily   carvedilol  25 mg Oral BID WC   feeding supplement (PROSource TF)  45 mL Per Tube BID   free water  150 mL Per Tube Q4H   lisinopril  40 mg Oral Daily   methimazole  5 mg Oral Daily   pantoprazole (PROTONIX) IV  40 mg Intravenous Q24H   spironolactone  50 mg Oral Daily   albuterol, ondansetron **OR** ondansetron (ZOFRAN) IV,  oxyCODONE-acetaminophen  Assessment/ Plan:  Ms. Joy Clark is a 71 y.o.  female a PMH significant for nicotine dependence, chronic pain syndrome, HLD who was brought to the ER with chief complaint of altered mental status.  Hypernatremia- secondary to GI losses, and sensibility losses and inability to ask for water as patient has altered mental status.  Patient appears to have a water deficit therefore sodium level is highly concentrated.   Sodium improved to 140. IVF stopped. Tube feeds started via NGT. Continue to encourage oral intake when alert.   2.  Hypertension blood pressure remains elevated 178/87 Currently receiving carvedilol 25 mg twice daily, lisinopril 40 mg daily and spironolactone 50 mg daily.  Monitoring - d/c HCTZ  3.  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  Remains elevated at 17.3  4.  Chronic diastolic heart failure echo completed on 09/09/2021 shows EF 60 to 65% with a grade 1 diastolic dysfunction.    IVF stopped  5.  Urinary tract infection.  Patient currently prescribed ceftriaxone     LOS: 6   3/7/20233:05 PM

## 2021-09-13 NOTE — Progress Notes (Signed)
Physical Therapy Treatment ?Patient Details ?Name: Joy Clark ?MRN: [id number] ?DOB: 12-15-1950 ?Today's Date: 09/13/2021 ? ? ?History of Present Illness Joy Clark is a 71 y.o. female with medical history significant for nicotine dependence, chronic pain syndrome, dyslipidemia who was brought into the ER by EMS for evaluation of mental status changes for about 4 days. ? ?  ?PT Comments  ? ? Pt is making limited progress towards goal secondary to lethargy. This therapist took time to set up room for successful transfer in the event pt is more alert later in the day. Attempted to stimulate patient with sternal rubs and position change. Pt remains lethargic while seated at EOB. Not safe to transfer to chair at this time until pt more alert. Education given to family about benefit of mobility. Will continue to progress as able.   ?Recommendations for follow up therapy are one component of a multi-disciplinary discharge planning process, led by the attending physician.  Recommendations may be updated based on patient status, additional functional criteria and insurance authorization. ? ?Follow Up Recommendations ? Skilled nursing-short term rehab (<3 hours/day) ?  ?  ?Assistance Recommended at Discharge Frequent or constant Supervision/Assistance  ?Patient can return home with the following Two people to help with walking and/or transfers;Two people to help with bathing/dressing/bathroom;Direct supervision/assist for medications management;Assistance with feeding;Assistance with cooking/housework;Assist for transportation;Help with stairs or ramp for entrance ?  ?Equipment Recommendations ? None recommended by PT  ?  ?Recommendations for Other Services   ? ? ?  ?Precautions / Restrictions Precautions ?Precautions: Fall ?Precaution Comments: Aspiration ?Restrictions ?Weight Bearing Restrictions: No  ?  ? ?Mobility ? Bed Mobility ?Overal bed mobility: Needs Assistance ?Bed Mobility: Supine to Sit ?Rolling: Total assist ?   ?Supine to sit: Total assist ?Sit to supine: Total assist ?  ?General bed mobility comments: Pt lethargic upon arrival. Several attempts for arousal including strernal rubs and change of position. Pt with total assist for coming to sit at EOB, however still doesn't awaken. not safe for further progression of mobility. Returned back to bed and slid up towards [place]. ?  ? ?Transfers ?  ?  ?  ?  ?  ?  ?  ?  ?  ?General transfer comment: unable to perform ?  ? ?Ambulation/Gait ?  ?  ?  ?  ?  ?  ?  ?  ? ? ?Stairs ?  ?  ?  ?  ?  ? ? ?Wheelchair Mobility ?  ? ?Modified Rankin (Stroke Patients Only) ?  ? ? ?  ?Balance Overall balance assessment: Needs assistance ?Sitting-balance support: Feet supported ?Sitting balance-[name]: Zero ?Sitting balance - Comments: pt unable to maintain balance ?  ?  ?  ?  ?  ?  ?  ?  ?  ?  ?  ?  ?  ?  ?  ?  ? ?  ?Cognition Arousal/Alertness: Lethargic ?Behavior During Therapy: Flat affect ?Overall Cognitive Status: Impaired/Different from baseline ?  ?  ?  ?  ?  ?  ?  ?  ?  ?  ?  ?  ?  ?  ?  ?  ?General Comments: unable to follow commands or open eyes despite tactile cues and positional changes ?  ?  ? ?  ?Exercises Other Exercises ?Other Exercises: family education provided to daughter regarding positioning in bed, arousal level, and goals of therapy. ? ?  ?General Comments   ?  ?  ? ?Pertinent Vitals/Pain Pain Assessment ?Pain  Assessment: No/denies pain  ? ? ?Home Living   ?  ?  ?  ?  ?  ?  ?  ?  ?  ?   ?  ?Prior Function    ?  ?  ?   ? ?PT Goals (current goals can now be found in the care plan section) Acute Rehab PT Goals ?PT Goal Formulation: With family ?Time For Goal Achievement: 09/24/21 ?Potential to Achieve Goals: Fair ?Progress towards PT goals: Progressing toward goals ? ?  ?Frequency ? ? ? Min 2X/week ? ? ? ?  ?PT Plan Current plan remains appropriate  ? ? ?Co-evaluation   ?  ?  ?  ?  ? ?  ?AM-PAC PT "6 Clicks" Mobility   ?Outcome Measure ? Help needed turning from your back to  your side while in a flat bed without using bedrails?: A Lot ?Help needed moving from lying on your back to sitting on the side of a flat bed without using bedrails?: Total ?Help needed moving to and from a bed to a chair (including a wheelchair)?: Total ?Help needed standing up from a chair using your arms (e.g., wheelchair or bedside chair)?: A Lot ?Help needed to walk in hospital room?: Total ?Help needed climbing 3-5 steps with a railing? : Total ?6 Click Score: 8 ? ?  ?End of Session   ?Activity Tolerance: Patient limited by lethargy ?Patient left: in bed;with bed alarm set;with family/visitor present ?Nurse Communication: Mobility status ?PT Visit Diagnosis: Muscle weakness (generalized) (M62.81);Other abnormalities of gait and mobility (R26.89);Unsteadiness on feet (R26.81) ?  ? ? ?Time: [id number] ?PT Time Calculation (min) (ACUTE ONLY): 16 min ? ?Charges:  $Therapeutic Activity: 8-22 mins          ?          ? ?Greggory Stallion, PT, DPT, GCS ?[phone] ? ? ? ?Joy Clark ?09/13/2021, 3:46 PM ? ?

## 2021-09-13 NOTE — Progress Notes (Signed)
PIV removed. Discharge instructions completed. Patient verbalized understanding of medication regimen, follow up appointments and discharge instructions. Patient belongings gathered and packed to discharge.  

## 2021-09-13 NOTE — Progress Notes (Signed)
PROGRESS NOTE  Joy Clark    DOB: Dec 04, 1950, 71 y.o.  MCN:[id number]    Code Status: Full Code   DOA: 09/07/2021   LOS: 6   Brief hospital course  Joy Clark is a 71 y.o. female with a PMH significant for nicotine dependence, chronic pain syndrome, HLD. They presented from home to the ED on 09/07/2021 with AMS x 4 days. Began having confusion and chills about 4 days ago, per daughter followed by multiple episodes of emesis, diarrhea and poor PO intake.  In the ED, it was found that they had hypokalemia, lactic acidosis, elevated troponins, pyuria. CT head showed mild atrophy and white matter microvascular ischemic changes. Nothing acute was identified. No previous to compare to. CT abdomen/pelvis positive for diffuse wall thickening of colon and distal small bowel consistent with inflammatory or infectious enterocolitis. Also signs of chronic vs acute cholecystitis. RUQ [place] positive for borderline gallbladder hydrops and positive sonographic murphy sign. Surgery was consulted.  They were treated with supportive care including electrolyte repletion as well as antibiotics for UTI.   Patient was admitted to medicine service for further workup and management of AMS as outlined in detail below.  Overnight 3/1, patient had an event of Afib RVR which is presumably a new diagnosis for her. She was started on diltiazem bolus and drip which controlled her rate and she remained hemodynamically stable. Cardiology was consulted to evaluate.   09/13/21 -stable, no acute events  Assessment & Plan  Principal Problem:   Acute metabolic encephalopathy Active Problems:   Enterocolitis   UTI (urinary tract infection)   Hypokalemia   Abnormal CT scan, gallbladder   Altered mental status   Colitis   Dehydration   Gallbladder hydrops   Hyperthyroidism   Nausea vomiting and diarrhea   RUQ pain   Atrial fibrillation with RVR (HCC)   Hypernatremia   Aspiration into airway   Malnutrition of moderate  degree   Dysphagia  Acute metabolic encephalopathy on dementia dysphagia-  improved since admission with correction of underlying metabolic disorders but still not at baseline. No acute findings on head imaging on admission. Underlying conditions treated as below- - PT/OT/SLP - soft diet as tolerated.  - NG tube placed. Nutrition consulted to start diet.  - CBG monitoring - monitor for refeeding syndrome  New onset Afib RVR   elevated BP- converted to NSR with metoprolol and diltiazem gtt. Echo showing G1DD, EF 60-65%. Remains in sinus rhythm - cardiology signed off - heparin gtt converted to eliquis - switched metoprolol to carvedilol  TSH low- signs of hyperthyroid. 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 Thyroid US did not show suspicious nodules. T4 elevated, T3 normal. Family describes positive history of known thyroid disorder in the past but was not on treatment. - continue BB - methimazole started at low dose, can titrate up at f/u  HTN- remains very difficult to control but greatly improved. Likely related to hyperthyroid state and potentially medication withdrawals vs long-term under treated - increased spironolactone 50mg  daily - increased lisinopril 40mg  daily - continue carvedilol 25mg  daily - initiated HCTZ 12.5mg  daily which will hopefully have added benefit of treating hypernatremia > increased to 25mg   - avoid PRN medications unless in severe ranges.   Potential for opioid withdrawal- family endorses that patient takes large amount of percocet scheduled for LE pain.  - restart percocet and ativan PRN for withdrawal symptoms  Gallbladder disease- - general surgery signed off. Does not recommend urgent surgical intervention - analgesia PRN  and nutritional support   Hypokalemia   hypernatremia- improving  Secondary to GI losses from nausea, vomiting and diarrhea, poor PO intake due to encephalopathic state. Monitor for refeeding syndrome now that getting NG tube  feeds. K+ 2.7>2.6>3.2>3.9>3.7, Na+  149>154>151>146>140, Mg++ 1.6 Cr remaining stable wnl - Supplement potassium, Mg++ PRN - BMP in afternoon and am - discontinued D5 with K+ - nephrology following, appreciate recs   UTI (urinary tract infection)- urine culture showing e coli with sensitivity to CTX. - completed Rocephin x7 days   Enterocolitis- (present on admission) cdiff and GI pathogen panel both negative. Likely viral vs inflammatory - supportive care and treat underlying conditions - dc flagyl  Body mass index is 15.44 kg/m.  VTE ppx: heparin gttapixaban (ELIQUIS) tablet 5 mg  > eliquis  Diet:     Diet   DIET DYS 3 Room service appropriate? Yes; Fluid consistency: Thin   Subjective 09/13/21    Pt reports nothing today. She remains non verbal most of the time but family reports that she has had intermittent words in response to questions appropriately in the past couple days. Opens eyes to signify she can hear and follow commands.   Objective   Vitals:   09/12/21 2300 09/13/21 0000 09/13/21 0100 09/13/21 0400  BP: (!) 142/79 (!) 148/87 (!) 147/73 (!) 161/84  Pulse:    76  Resp: (!) 25 (!) 32 (!) 24 (!) 31  Temp: 98 F (36.7 C)   97.8 F (36.6 C)  TempSrc: Oral   Oral  SpO2:    95%  Weight:    48.8 kg  Height:        Intake/Output Summary (Last 24 hours) at 09/13/2021 0745 Last data filed at 09/12/2021 1600 Gross per 24 hour  Intake 60 ml  Output 1 ml  Net 59 ml    Filed Weights   09/07/21 1106 09/13/21 0400  Weight: 63.5 kg 48.8 kg     Physical Exam:  General: awake, alert, NAD HEENT: atraumatic, clear conjunctiva, anicteric sclera, MMM, hearing grossly normal Respiratory: normal respiratory effort. Cardiovascular: normal S1/S2, RRR, no JVD, murmurs, quick capillary refill  Gastrointestinal: soft, NT, ND Nervous: Alert and can follow simple commands. Non-verbal. Falls asleep if not actively engaged Extremities: moves all equally, no edema, normal  tone Skin: dry, intact, normal temperature, normal color. No rashes, lesions or ulcers on exposed skin. Erythematous sacral area without skin break down  Labs   I have personally reviewed the following labs and imaging studies CBC    Component Value Date/Time   WBC 19.5 (H) 09/13/2021 0619   RBC 5.50 (H) 09/13/2021 0619   HGB 17.3 (H) 09/13/2021 0619   HCT 50.7 (H) 09/13/2021 0619   PLT 195 09/13/2021 0619   MCV 92.2 09/13/2021 0619   MCH 31.5 09/13/2021 0619   MCHC 34.1 09/13/2021 0619   RDW 12.3 09/13/2021 0619   LYMPHSABS 0.7 09/07/2021 1109   MONOABS 0.5 09/07/2021 1109   EOSABS 0.0 09/07/2021 1109   BASOSABS 0.0 09/07/2021 1109   BMP Latest Ref Rng & Units 09/13/2021 09/12/2021 09/11/2021  Glucose 70 - 99 mg/dL 169(C) 789(F) 810(F)  BUN 8 - 23 mg/dL 75(Z) 02(H) 85(I)  Creatinine 0.44 - 1.00 mg/dL 7.78 2.42 3.53  Sodium 135 - 145 mmol/L 140 146(H) 151(H)  Potassium 3.5 - 5.1 mmol/L 3.6 3.7 3.9  Chloride 98 - 111 mmol/L 102 109 112(H)  CO2 22 - 32 mmol/L 28 28 26   Calcium 8.9 - 10.3 mg/dL  8.8(L) 8.7(L) 9.1    DG Abd 1 View  Result Date: 09/11/2021 CLINICAL DATA:  Status post nasogastric tube placement. EXAM: ABDOMEN - 1 VIEW COMPARISON:  None. FINDINGS: A nasogastric tube is seen with its distal end looped within the body of the stomach. The distal tip overlies expected region of the gastric fundus. The bowel gas pattern is normal. No radio-opaque calculi or other significant radiographic abnormality are seen. IMPRESSION: Nasogastric tube positioning, as described above. Electronically Signed   By: Aram Candela M.D.   On: 09/11/2021 23:37   CT HEAD WO CONTRAST ( )  Result Date: 09/11/2021 CLINICAL DATA:  Altered mental status EXAM: CT HEAD WITHOUT CONTRAST TECHNIQUE: Contiguous axial images were obtained from the base of the skull through the vertex without intravenous contrast. RADIATION DOSE REDUCTION: This exam was performed according to the departmental dose-optimization  program which includes automated exposure control, adjustment of the mA and/or kV according to patient size and/or use of iterative reconstruction technique. COMPARISON:  09/07/2021 FINDINGS: Brain: No evidence of acute infarction, hemorrhage, hydrocephalus, extra-axial collection or mass lesion/mass effect. Mild atrophic and chronic white matter ischemic changes are noted. Vascular: No hyperdense vessel or unexpected calcification. Skull: Normal. Negative for fracture or focal lesion. Sinuses/Orbits: No acute finding. Other: None. IMPRESSION: Chronic atrophic and ischemic changes without acute abnormality. Electronically Signed   By: Alcide Clever M.D.   On: 09/11/2021 23:06    Disposition Plan & Communication  Patient status: Inpatient  Admitted From: Home Planned disposition location: SNF locally Anticipated discharge date: 3/11 pending further medical evaluation  Family Communication: son at bedside   Author: Leeroy Bock, DO [facility] 09/13/2021, 7:45 AM   Available by Epic secure chat 7AM-7PM. If 7PM-7AM, please contact night-coverage.  [facility] contact information found on [email or web address].

## 2021-09-13 NOTE — Progress Notes (Signed)
Notified by family of leaking PIV.  PIV removed. Wrist edematous.  Notified MD and pharmacist.  ? ?

## 2021-09-14 ENCOUNTER — Inpatient Hospital Stay: Payer: Medicare Other

## 2021-09-14 DIAGNOSIS — R1310 Dysphagia, unspecified: Secondary | ICD-10-CM | POA: Diagnosis not present

## 2021-09-14 DIAGNOSIS — G9341 Metabolic encephalopathy: Secondary | ICD-10-CM | POA: Diagnosis not present

## 2021-09-14 DIAGNOSIS — I4891 Unspecified atrial fibrillation: Secondary | ICD-10-CM | POA: Diagnosis not present

## 2021-09-14 LAB — COMPREHENSIVE METABOLIC PANEL
ALT: 20 U/L (ref 0–44)
AST: 22 U/L (ref 15–41)
Albumin: 2.8 g/dL — ABNORMAL LOW (ref 3.5–5.0)
Alkaline Phosphatase: 76 U/L (ref 38–126)
Anion gap: 10 (ref 5–15)
BUN: 33 mg/dL — ABNORMAL HIGH (ref 8–23)
CO2: 26 mmol/L (ref 22–32)
Calcium: 9.1 mg/dL (ref 8.9–10.3)
Chloride: 108 mmol/L (ref 98–111)
Creatinine, Ser: 0.68 mg/dL (ref 0.44–1.00)
GFR, Estimated: >60 mL/min (ref >60)
Glucose, Bld: 154 mg/dL — ABNORMAL HIGH (ref 70–99)
Potassium: 4 mmol/L (ref 3.5–5.1)
Sodium: 144 mmol/L (ref 135–145)
Total Bilirubin: 0.8 mg/dL (ref 0.3–1.2)
Total Protein: 6.1 g/dL — ABNORMAL LOW (ref 6.5–8.1)

## 2021-09-14 LAB — CBC
HCT: 49.7 % — ABNORMAL HIGH (ref 36.0–46.0)
Hemoglobin: 16.9 g/dL — ABNORMAL HIGH (ref 12.0–15.0)
MCH: 31.5 pg (ref 26.0–34.0)
MCHC: 34 g/dL (ref 30.0–36.0)
MCV: 92.7 fL (ref 80.0–100.0)
Platelets: 212 10*3/uL (ref 150–400)
RBC: 5.36 MIL/uL — ABNORMAL HIGH (ref 3.87–5.11)
RDW: 12.3 % (ref 11.5–15.5)
WBC: 22.8 10*3/uL — ABNORMAL HIGH (ref 4.0–10.5)
nRBC: 0 % (ref 0.0–0.2)

## 2021-09-14 LAB — GLUCOSE, CAPILLARY
Glucose-Capillary: 140 mg/dL — ABNORMAL HIGH (ref 70–99)
Glucose-Capillary: 159 mg/dL — ABNORMAL HIGH (ref 70–99)
Glucose-Capillary: 149 mg/dL — ABNORMAL HIGH (ref 70–99)
Glucose-Capillary: 139 mg/dL — ABNORMAL HIGH (ref 70–99)
Glucose-Capillary: 159 mg/dL — ABNORMAL HIGH (ref 70–99)

## 2021-09-14 IMAGING — DX DG CHEST 1V PORT
1 series · 1 of 1 positions shown · non-contrast
Comparison: [DATE]

CLINICAL DATA: Short of breath

EXAM:
PORTABLE CHEST 1 VIEW

[chest ap]
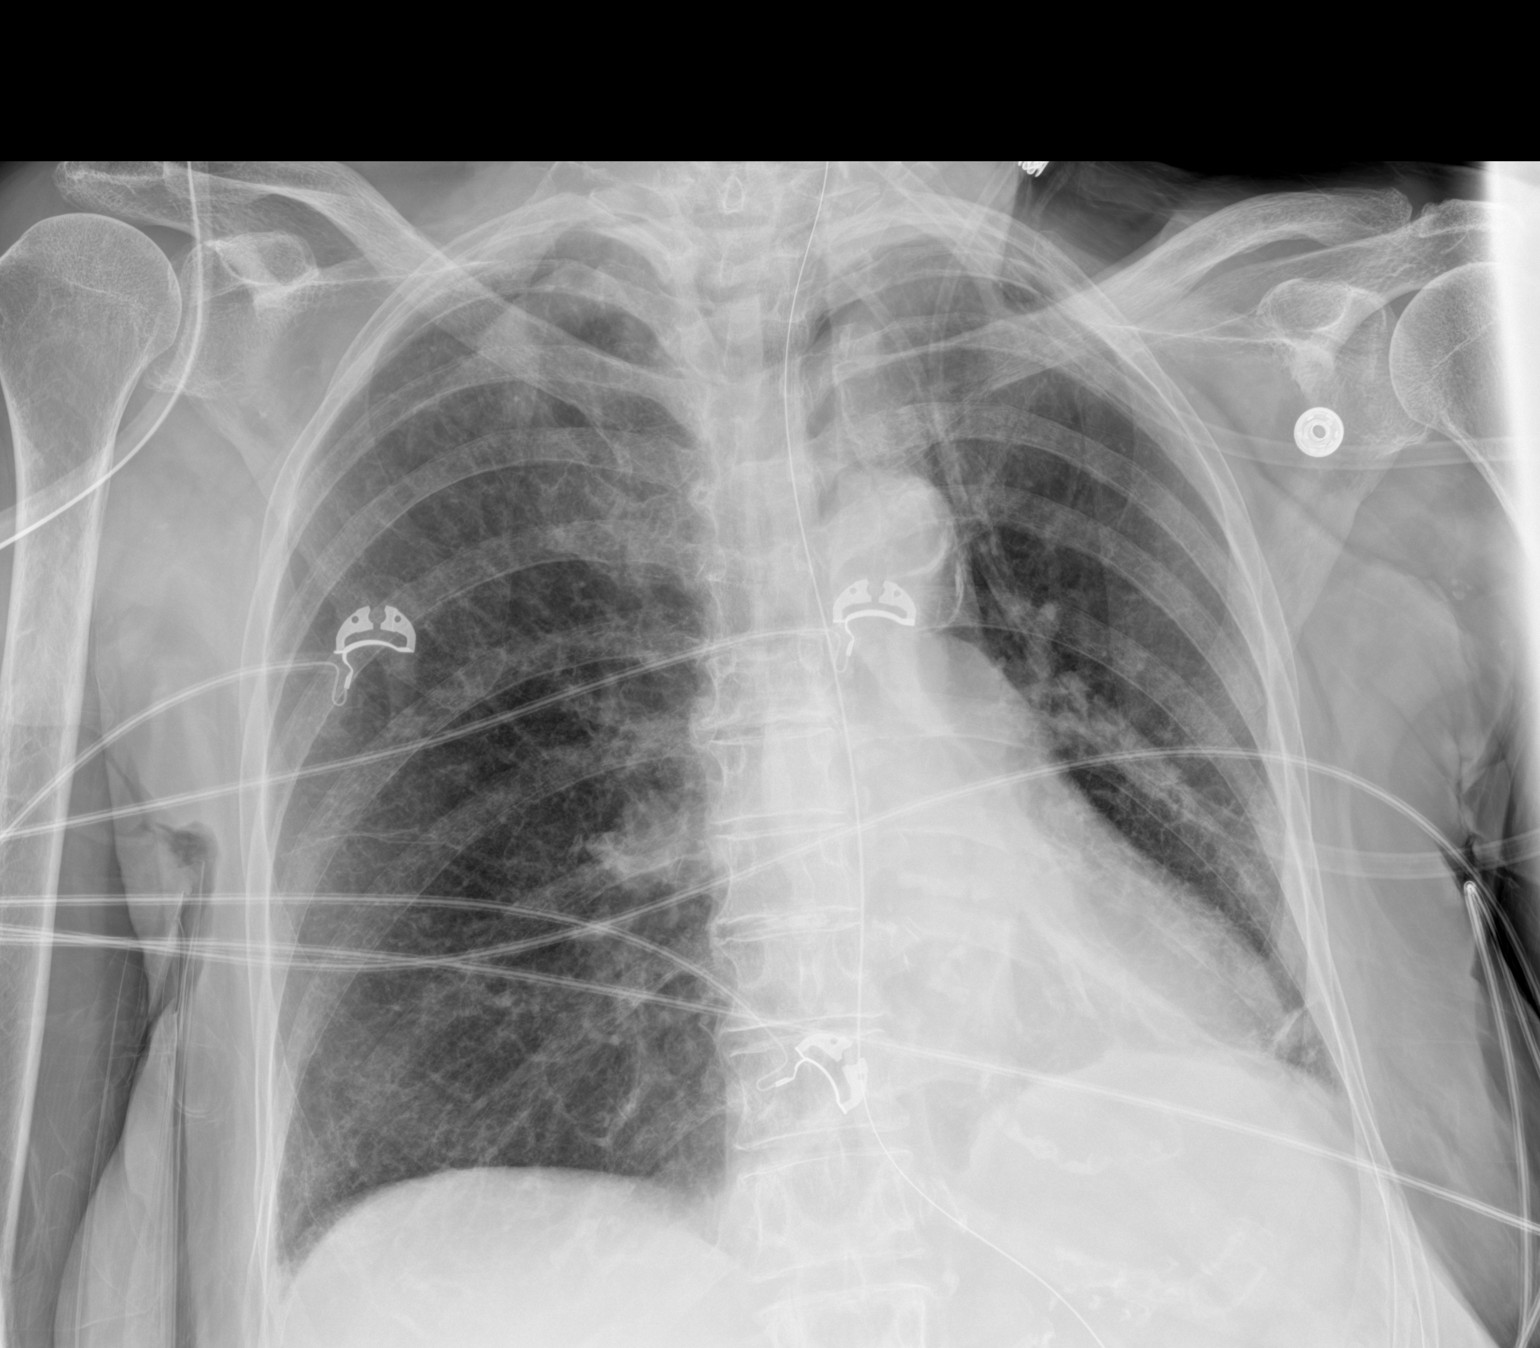

[1 of 1 positions shown; findings below may reference images not displayed]

FINDINGS: Heart size and vascularity normal. Negative for heart failure.
Atherosclerotic calcification aorta

Mild left lower lobe airspace disease likely atelectasis. This has
developed in the interval. Minimal bilateral effusions. Right lung
otherwise clear.

NG tube enters the stomach with the tip not visualized.
IMPRESSION: Interval development of mild left lower lobe atelectasis and minimal
pleural effusion bilaterally. Negative for heart failure.

## 2021-09-14 MED ORDER — FREE WATER
200.0000 mL | Status: DC
  Administered 2021-09-14 – 2021-09-20 (×32): 200 mL

## 2021-09-14 NOTE — Progress Notes (Signed)
Occupational Therapy Treatment ?Patient Details ?Name: Joy Clark ?MRN: [id number] ?DOB: 26-Nov-1950 ?Today's Date: 09/14/2021 ? ? ?History of present illness Joy Clark is a 71 y.o. female with medical history significant for nicotine dependence, chronic pain syndrome, dyslipidemia who was brought into the ER by EMS for evaluation of mental status changes for about 4 days. ?  ?OT comments ? Joy Clark was seen for OT treatment on this date. Upon arrival to room pt reclined in bed, noted to have had BM.  RN in to assist. Pt requires MAX A x2 toileting at bed level. TOTAL A sup<>sit, decreased ability to maintain sitting balance this date however tolerates ~10 min. MAX A x2 hair brushing in sitting. MAX A oral care at bed level. Pt making progress toward goals. Family education on PROM and delirium precautions. Pt continues to benefit from skilled OT services to maximize return to PLOF and minimize risk of future falls, injury, caregiver burden, and readmission. Will continue to follow POC. Discharge recommendation remains appropriate.  ?  ? ?Recommendations for follow up therapy are one component of a multi-disciplinary discharge planning process, led by the attending physician.  Recommendations may be updated based on patient status, additional functional criteria and insurance authorization. ?   ?Follow Up Recommendations ? Skilled nursing-short term rehab (<3 hours/day)  ?  ?Assistance Recommended at Discharge Frequent or constant Supervision/Assistance  ?Patient can return home with the following ? Two people to help with walking and/or transfers;Two people to help with bathing/dressing/bathroom;Help with stairs or ramp for entrance ?  ?Equipment Recommendations ? BSC/3in1  ?  ?Recommendations for Other Services   ? ?  ?Precautions / Restrictions Precautions ?Precautions: Fall ?Restrictions ?Weight Bearing Restrictions: No  ? ? ?  ? ?Mobility Bed Mobility ?Overal bed mobility: Needs Assistance ?Bed Mobility:  Rolling, Supine to Sit, Sit to Supine ?Rolling: Max assist, +2 for physical assistance ?  ?Supine to sit: Total assist ?Sit to supine: Total assist ?  ?  ?  ? ?Transfers ?  ?  ?  ?  ?  ?  ?  ?  ?  ?General transfer comment: not safe this date ?  ?  ?Balance Overall balance assessment: Needs assistance ?Sitting-balance support: Feet supported ?Sitting balance-[name]: Zero ?  ?  ?  ?  ?  ?  ?  ?  ?  ?  ?  ?  ?  ?  ?  ?  ?   ? ?ADL either performed or assessed with clinical judgement  ? ?ADL Overall ADL's : Needs assistance/impaired ?  ?  ?  ?  ?  ?  ?  ?  ?  ?  ?  ?  ?  ?  ?  ?  ?  ?  ?  ?General ADL Comments: MAX A x2 hair brushing in sitting. MAX A oral care at bed level. MAX A x2 toileting at bed level ?  ? ? ? ?Cognition Arousal/Alertness: Lethargic ?Behavior During Therapy: Flat affect ?Overall Cognitive Status: Impaired/Different from baseline ?Area of Impairment: Orientation, Following commands, Safety/judgement ?  ?  ?  ?  ?  ?  ?  ?  ?  ?  ?  ?Following Commands: Follows one step commands inconsistently ?Safety/Judgement: Decreased awareness of safety, Decreased awareness of deficits ?  ?  ?General Comments: opens eyes to name inconsistently ?  ?  ?   ?   ?   ?   ? ? ?Pertinent Vitals/ Pain  Pain Assessment ?Pain Assessment: Faces ?Faces Pain Scale: Hurts little more ?Pain Location: mouth during oral care ?Pain Descriptors / Indicators: Grimacing, Moaning ?Pain Intervention(s): Limited activity within patient's tolerance, Repositioned ? ? ?Frequency ? Min 3X/week  ? ? ? ? ?  ?Progress Toward Goals ? ?OT Goals(current goals can now be found in the care plan section) ? Progress towards OT goals: Progressing toward goals ? ?Acute Rehab OT Goals ?Patient Stated Goal: none stated ?OT Goal Formulation: Patient unable to participate in goal setting ?Time For Goal Achievement: 09/24/21 ?Potential to Achieve Goals: Fair ?ADL Goals ?Pt Will Perform Grooming: sitting;with min guard assist ?Pt Will Perform  Lower Body Dressing: with min assist;sitting/lateral leans ?Pt Will Transfer to Toilet: with supervision ?Pt Will Perform Toileting - Clothing Manipulation and hygiene: with min assist;sitting/lateral leans  ?Plan Discharge plan remains appropriate;Frequency remains appropriate   ? ?Co-evaluation ? ? ?   ?  ?  ?  ?  ? ?  ?AM-PAC OT "6 Clicks" Daily Activity     ?Outcome Measure ? ? Help from another person eating meals?: A Lot ?Help from another person taking care of personal grooming?: A Lot ?Help from another person toileting, which includes using toliet, bedpan, or urinal?: A Lot ?Help from another person bathing (including washing, rinsing, drying)?: A Lot ?Help from another person to put on and taking off regular upper body clothing?: A Lot ?Help from another person to put on and taking off regular lower body clothing?: A Lot ?6 Click Score: 12 ? ?  ?End of Session   ? ?OT Visit Diagnosis: Other abnormalities of gait and mobility (R26.89);Muscle weakness (generalized) (M62.81) ?  ?Activity Tolerance Patient tolerated treatment well ?  ?Patient Left in bed;with call bell/phone within reach;with bed alarm set;with family/visitor present ?  ?Nurse Communication Mobility status ?  ? ?   ? ?Time: [id number] ?OT Time Calculation (min): 39 min ? ?Charges: OT General Charges ?$OT Visit: 1 Visit ?OT Treatments ?$Self Care/Home Management : 38-52 mins ? ?Dessie Coma, M.S. OTR/L  ?09/14/21, 4:25 PM  ?ascom [phone] ? ?

## 2021-09-14 NOTE — Progress Notes (Signed)
Assumed care of pt at 1900. Pt is alert but non verbal. Not following commands. Frequent incontinence care, oral care and repositioning. Tube feed infusing via NG tube. Non verbal signs of pain, view flowsheets and MAR for med administration. Hypertensive and tachypneic at this time. Respiratory at bedside w/ RN to assess pt.  ? ?Daughter at bedside for part of the night and updated on POC w/ verbalized understanding. Comfort and safety maintained overnight.  ?

## 2021-09-14 NOTE — Progress Notes (Signed)
PROGRESS NOTE    Joy Clark  ZOX:[id number] DOB: 1951-02-19 DOA: 09/07/2021 PCP: Pcp, No    Assessment & Plan:   Principal Problem:   Acute metabolic encephalopathy Active Problems:   Enterocolitis   UTI (urinary tract infection)   Hypokalemia   Abnormal CT scan, gallbladder   Altered mental status   Colitis   Dehydration   Gallbladder hydrops   Hyperthyroidism   Nausea vomiting and diarrhea   RUQ pain   Atrial fibrillation with RVR (HCC)   Hypernatremia   Aspiration into airway   Malnutrition of moderate degree   Dysphagia   Acute metabolic encephalopathy: no hx of dementia as per pt's son. Continue w/ supportive care. Repeat CXR shows atelectasis & neg for heart heart. Encourage incentive spirometry.  Dysphagia: continue on dysphagia III diet as per speech    A. fib: w/ RVR. New onset. Back in sinus rhythm. Continue on coreg, eliquis    Low TSH:  signs of hyperthyroid. Possible Grave's disease. Thyroid US did not show suspicious nodules. T4 elevated, T3 normal. Continue on coreg, methimazole. Will need to f/u outpatient w/ endocrinology   HTN: uncontrolled. Continue on spironolactone, lisinopril, coreg   Potential for opioid withdrawal: family endorses that patient takes large amount of percocet scheduled for LE pain. Percocet prn    Gallbladder disease: no urgent surgical intervention as per gen surg.   Leukocytosis: completed abx. Trending up. No fevers. Pro-cal 0.53   Hypokalemia: WNL today   Hypernatremia: resolved    UTI: completed abx course    Enterocolitis: likely viral vs inflammatory. GI PCR panel, c. diff both neg    DVT prophylaxis: eliquis  Code Status:  full  Family Communication: discussed pt's care w/ pt's son at bedside and answered his questions Disposition Plan:  possibly d/c to SNF   Level of care: Progressive  Status is: Inpatient Remains inpatient appropriate because: still has NG tube in place & mental status is not a baseline     Consultants:  Nephro   Procedures:   Antimicrobials:   Subjective: Pt will not answer any of my questions today   Objective: Vitals:   09/13/21 2345 09/14/21 0335 09/14/21 0346 09/14/21 0740  BP: 136/76 (!) 168/82  (!) 154/81  Pulse: 73 77  81  Resp: (!) 30 (!) 34    Temp: 97.8 F (36.6 C) 98.2 F (36.8 C)  98.9 F (37.2 C)  TempSrc: Oral Axillary    SpO2: 97% 95%  95%  Weight:   48.5 kg   Height:        Intake/Output Summary (Last 24 hours) at 09/14/2021 0803 Last data filed at 09/14/2021 0644 Gross per 24 hour  Intake 5387.83 ml  Output 1 ml  Net 5386.83 ml   Filed Weights   09/07/21 1106 09/13/21 0400 09/14/21 0346  Weight: 63.5 kg 48.8 kg 48.5 kg    Examination:  General exam: Appears calm but uncomfortable  Respiratory system: diminished breath sounds b/l  Cardiovascular system: S1 & S2 +. No rubs, gallops or clicks.  Gastrointestinal system: Abdomen is nondistended, soft and nontender. Normal bowel sounds heard. Central nervous system: Alert and awake. Moves all extremities. Psychiatry: Judgement and insight appear poor. Flat mood and affect      Data Reviewed: I have personally reviewed following labs and imaging studies  CBC: Recent Labs  Lab 09/07/21 1109 09/08/21 0336 09/10/21 0546 09/11/21 0544 09/12/21 0425 09/13/21 0619 09/14/21 0620  WBC 13.7*   13.6*   < >  18.3* 19.6* 17.4* 19.5* 22.8*  NEUTROABS 12.2*  --   --   --   --   --   --   HGB 16.2*   16.3*   < > 17.2* 17.6* 16.4* 17.3* 16.9*  HCT 47.1*   47.7*   < > 50.9* 53.1* 49.6* 50.7* 49.7*  MCV 92.9   93.2   < > 94.4 94.8 94.7 92.2 92.7  PLT 263   259   < > 315 265 206 195 212   < > = values in this interval not displayed.   Basic Metabolic Panel: Recent Labs  Lab 09/10/21 0846 09/11/21 0544 09/12/21 0425 09/12/21 1304 09/12/21 1715 09/13/21 0619 09/13/21 1713 09/14/21 0620  NA 152* 151* 146*  --   --  140  --  144  K 3.8 3.9 3.7  --   --  3.6  --  4.0  CL 117* 112*  109  --   --  102  --  108  CO2 --   --  28  --  26  GLUCOSE 149* 138* 127*  --   --  146*  --  154*  BUN 33* 30* 26*  --   --  25*  --  33*  CREATININE 0.88 0.83 0.75  --   --  0.68  --  0.68  CALCIUM 9.0 9.1 8.7*  --   --  8.8*  --  9.1  MG 2.0  --   --  1.6* 1.7 1.8 1.8  --   PHOS  --   --   --  3.1 2.9 2.8 2.6  --    GFR: Estimated Creatinine Clearance: 49.4 mL/min (by C-G formula based on SCr of 0.68 mg/dL). Liver Function Tests: Recent Labs  Lab 09/07/21 1109 09/09/21 0554 09/11/21 0544 09/12/21 0425 09/14/21 0620  AST 36 ALT ALKPHOS 144* 113 84 61 76  BILITOT 0.5 0.8 0.8 0.6 0.8  PROT 7.6 7.6 6.8 6.5 6.1*  ALBUMIN 3.2* 3.2* 2.8* 2.8* 2.8*   Recent Labs  Lab 09/07/21 1109  LIPASE 26   No results for input(s): AMMONIA in the last 168 hours. Coagulation Profile: Recent Labs  Lab 09/08/21 1259  INR 1.3*   Cardiac Enzymes: No results for input(s): CKTOTAL, CKMB, CKMBINDEX, TROPONINI in the last 168 hours. BNP (last 3 results) No results for input(s): PROBNP in the last 8760 hours. HbA1C: No results for input(s): HGBA1C in the last 72 hours. CBG: Recent Labs  Lab 09/13/21 1637 09/13/21 1942 09/13/21 2347 09/14/21 0333 09/14/21 0718  GLUCAP 136* 157* 150* 140* 159*   Lipid Profile: No results for input(s): CHOL, HDL, LDLCALC, TRIG, CHOLHDL, LDLDIRECT in the last 72 hours. Thyroid Function Tests: No results for input(s): TSH, T4TOTAL, FREET4, T3FREE, THYROIDAB in the last 72 hours. Anemia Panel: No results for input(s): VITAMINB12, FOLATE, FERRITIN, TIBC, IRON, RETICCTPCT in the last 72 hours. Sepsis Labs: Recent Labs  Lab 09/07/21 1109 09/08/21 0338 09/08/21 0644  PROCALCITON  --  0.53  --   LATICACIDVEN 1.3 1.0 0.9    Recent Results (from the past 240 hour(s))  Blood culture (single)     Status: None   Collection Time: 09/07/21 11:09 AM   Specimen: BLOOD LEFT ARM  Result Value Ref Range Status    Specimen Description BLOOD LEFT ARM  Final   Special Requests   Final    BOTTLES DRAWN AEROBIC  AND ANAEROBIC Blood Culture adequate volume   Culture   Final    NO GROWTH 5 DAYS Performed at [facility], [address] [place], [place] [address]    Report Status 09/12/2021 FINAL  Final  Resp Panel by RT-PCR (Flu A&B, Covid) Nasopharyngeal Swab     Status: None   Collection Time: 09/07/21 11:09 AM   Specimen: Nasopharyngeal Swab; Nasopharyngeal(NP) swabs in vial transport medium  Result Value Ref Range Status   SARS Coronavirus 2 by RT PCR NEGATIVE NEGATIVE Final    Comment: (NOTE) SARS-CoV-2 target nucleic acids are NOT DETECTED.  The SARS-CoV-2 RNA is generally detectable in upper respiratory specimens during the acute phase of infection. The lowest concentration of SARS-CoV-2 viral copies this assay can detect is 138 copies/mL. A negative result does not preclude SARS-Cov-2 infection and should not be used as the sole basis for treatment or other patient management decisions. A negative result may occur with  improper specimen collection/handling, submission of specimen other than nasopharyngeal swab, presence of viral mutation(s) within the areas targeted by this assay, and inadequate number of viral copies(<138 copies/mL). A negative result must be combined with clinical observations, patient history, and epidemiological information. The expected result is Negative.  Fact Sheet for Patients:  BloggerCourse.com  Fact Sheet for Healthcare Providers:  SeriousBroker.it  This test is no t yet approved or cleared by the [place] FDA and  has been authorized for detection and/or diagnosis of SARS-CoV-2 by FDA under an Emergency Use Authorization (EUA). This EUA will remain  in effect (meaning this test can be used) for the duration of the COVID-19 declaration under Section 564(b)(1) of the Act, 21 U.S.C.section  360bbb-3(b)(1), unless the authorization is terminated  or revoked sooner.       Influenza A by PCR NEGATIVE NEGATIVE Final   Influenza B by PCR NEGATIVE NEGATIVE Final    Comment: (NOTE) The Xpert Xpress SARS-CoV-2/FLU/RSV plus assay is intended as an aid in the diagnosis of influenza from Nasopharyngeal swab specimens and should not be used as a sole basis for treatment. Nasal washings and aspirates are unacceptable for Xpert Xpress SARS-CoV-2/FLU/RSV testing.  Fact Sheet for Patients: BloggerCourse.com  Fact Sheet for Healthcare Providers: SeriousBroker.it  This test is not yet approved or cleared by the [place] FDA and has been authorized for detection and/or diagnosis of SARS-CoV-2 by FDA under an Emergency Use Authorization (EUA). This EUA will remain in effect (meaning this test can be used) for the duration of the COVID-19 declaration under Section 564(b)(1) of the Act, 21 U.S.C. section 360bbb-3(b)(1), unless the authorization is terminated or revoked.  Performed at [facility], [address], [place], [place] [address]   Urine Culture     Status: Abnormal   Collection Time: 09/07/21 11:23 AM   Specimen: Urine, Clean Catch  Result Value Ref Range Status   Specimen Description   Final    URINE, CLEAN CATCH Performed at [facility], [address], [place], [place] [address]    Special Requests   Final    NONE Performed at [facility], [address] [place], [place] [address]    Culture >=100,000 COLONIES/mL ESCHERICHIA COLI (A)  Final   Report Status 09/11/2021 FINAL  Final   Organism ID, Bacteria ESCHERICHIA COLI (A)  Final      Susceptibility   Escherichia coli - MIC*    AMPICILLIN >=32 RESISTANT Resistant     CEFAZOLIN <=4 SENSITIVE Sensitive  CEFEPIME <=0.12 SENSITIVE Sensitive     CEFTRIAXONE <=0.25 SENSITIVE Sensitive     CIPROFLOXACIN <=0.25 SENSITIVE  Sensitive     GENTAMICIN <=1 SENSITIVE Sensitive     IMIPENEM <=0.25 SENSITIVE Sensitive     NITROFURANTOIN <=16 SENSITIVE Sensitive     TRIMETH/SULFA <=20 SENSITIVE Sensitive     AMPICILLIN/SULBACTAM >=32 RESISTANT Resistant     PIP/TAZO <=4 SENSITIVE Sensitive     * >=100,000 COLONIES/mL ESCHERICHIA COLI  C Difficile Quick Screen w PCR reflex     Status: None   Collection Time: 09/07/21  7:21 PM   Specimen: STOOL  Result Value Ref Range Status   C Diff antigen NEGATIVE NEGATIVE Final   C Diff toxin NEGATIVE NEGATIVE Final   C Diff interpretation No C. difficile detected.  Final    Comment: Performed at [facility], [address] [place], [place] [address]  Gastrointestinal Panel by PCR , Stool     Status: None   Collection Time: 09/07/21  7:21 PM   Specimen: STOOL  Result Value Ref Range Status   Campylobacter species NOT DETECTED NOT DETECTED Final   Plesimonas shigelloides NOT DETECTED NOT DETECTED Final   Salmonella species NOT DETECTED NOT DETECTED Final   Yersinia enterocolitica NOT DETECTED NOT DETECTED Final   Vibrio species NOT DETECTED NOT DETECTED Final   Vibrio cholerae NOT DETECTED NOT DETECTED Final   Enteroaggregative E coli (EAEC) NOT DETECTED NOT DETECTED Final   Enteropathogenic E coli (EPEC) NOT DETECTED NOT DETECTED Final   Enterotoxigenic E coli (ETEC) NOT DETECTED NOT DETECTED Final   Shiga like toxin producing E coli (STEC) NOT DETECTED NOT DETECTED Final   Shigella/Enteroinvasive E coli (EIEC) NOT DETECTED NOT DETECTED Final   Cryptosporidium NOT DETECTED NOT DETECTED Final   Cyclospora cayetanensis NOT DETECTED NOT DETECTED Final   Entamoeba histolytica NOT DETECTED NOT DETECTED Final   Giardia lamblia NOT DETECTED NOT DETECTED Final   Adenovirus F40/41 NOT DETECTED NOT DETECTED Final   Astrovirus NOT DETECTED NOT DETECTED Final   Norovirus GI/GII NOT DETECTED NOT DETECTED Final   Rotavirus A NOT DETECTED NOT DETECTED Final   Sapovirus (I,  II, IV, and V) NOT DETECTED NOT DETECTED Final    Comment: Performed at [facility], [address] [place], [place] [address]         Radiology Studies: No results found.      Scheduled Meds:  apixaban  5 mg Oral BID   atorvastatin  40 mg Oral Daily   carvedilol  25 mg Oral BID WC   feeding supplement (PROSource TF)  45 mL Per Tube BID   free water  150 mL Per Tube Q4H   lisinopril  40 mg Oral Daily   methimazole  5 mg Oral Daily   pantoprazole (PROTONIX) IV  40 mg Intravenous Q24H   spironolactone  50 mg Oral Daily   Continuous Infusions:  feeding supplement (OSMOLITE 1.5 CAL) 55 mL/hr at 09/13/21 2235     LOS: 7 days    Time spent: 30 mins     Charise Killian, MD [facility] Pager [phone]  If 7PM-7AM, please contact night-coverage 09/14/2021, 8:03 AM

## 2021-09-14 NOTE — Progress Notes (Signed)
Nutrition Follow-up ? ?DOCUMENTATION CODES:  ? ?Non-severe (moderate) malnutrition in context of acute illness/injury ? ?INTERVENTION:  ? ?Initiate Osmolite 1.5 @ 55 ml/hr via NGT  ?  ?45 ml Prosource TF BID. ?  ?150 ml free water every 4 hours   ?  ?Tube feeding regimen provides 2060 kcal (100% of needs), 105 grams of protein, and 1006 ml of H2O. Total free water: 1906 ml daily ? ?NUTRITION DIAGNOSIS:  ? ?Moderate Malnutrition related to acute illness (acute metabolic encepahlopathy) as evidenced by energy intake < or equal to 50% for > or equal to 5 days, mild fat depletion, moderate fat depletion, mild muscle depletion, moderate muscle depletion. ? ?Ongoing ? ?GOAL:  ? ?Patient will meet greater than or equal to 90% of their needs ? ?Met with TF ? ?MONITOR:  ? ?PO intake, Supplement acceptance, Diet advancement, Labs, Weight trends, TF tolerance, Skin, I & O's ? ?REASON FOR ASSESSMENT:  ? ?Low Braden, Consult ?Enteral/tube feeding initiation and management ? ?ASSESSMENT:  ? ?Joy Clark is a 71 y.o. female with medical history significant for nicotine dependence, chronic pain syndrome, dyslipidemia who was brought into the ER by EMS for evaluation of mental status changes for about 4 days.  Patient is visiting her daughter here in [place] and has been here for about 4 weeks.  At baseline she is usually awake, alert and oriented to person place and time.  Patient's daughter states that her symptoms started about 4 days ago with chills and confusion.  Since then she has had multiple episodes of emesis and diarrhea and is now incontinent of stool which is loose and has lots of mucus in it.  Patient's oral intake has been very poor over the last 4 days as well.  She is awake and oriented to person but is unable to follow commands. ? ?3/2- s/p BSE- advanced to full liquid diet ?3/3- s/p BSE- advanced to regular diet ?3/5- NGT placed (placement confirmed in stomach) ?3/6- TF initiated ? ?Reviewed I/O's: +5.4 L x  24 hours and +8.4 L since admission ? ?Pt resting in bed at time of visit. She did not respond to name being called. No family at bedside.   ? ?Pt tolerating TF well. Noted Osmolite 1.5 infusion at goal rate of 55 ml/hr.  ? ?Labs reviewed: K, Mg, and Phos WDL. CBGS: 140-159 (inpatient orders for glycemic control are none).   ? ?Diet Order:   ?Diet Order   ? ?       ?  DIET DYS 3 Room service appropriate? Yes; Fluid consistency: Thin  Diet effective now       ?  ? ?  ?  ? ?  ? ? ?EDUCATION NEEDS:  ? ?Education needs have been addressed ? ?Skin:  Skin Assessment: Reviewed RN Assessment ? ?Last BM:  09/11/21 (type 7) ? ?Height:  ? ?Ht Readings from Last 1 Encounters:  ?09/07/21 5' 10"  (1.778 m)  ? ? ?Weight:  ? ?Wt Readings from Last 1 Encounters:  ?09/14/21 48.5 kg  ? ? ?Ideal Body Weight:  68.2 kg ? ?BMI:  Body mass index is 15.35 kg/m?. ? ?Estimated Nutritional Needs:  ? ?Kcal:  1900-2100 ? ?Protein:  95-110 grams ? ?Fluid:  > 1.9 L ? ? ? ?Loistine Chance, RD, LDN, CDCES ?Registered Dietitian II ?Certified Diabetes Care and Education Specialist ?Please refer to [facility] for RD and/or RD on-call/weekend/after hours pager  ?

## 2021-09-14 NOTE — Progress Notes (Addendum)
[facility]  ROUNDING NOTE   Subjective:   Patient seen resting quietly with eyes closed Son at bedside Son states she is more drowsy this morning Able to respond to simple questions, yes/no/ok Patient seen later with eyes open but responses delayed  NGT in place with tube feeds at 22ml/hr Sodium 144  Objective:  Vital signs in last 24 hours:  Temp:  [97.8 F (36.6 C)-98.9 F (37.2 C)] 98.9 F (37.2 C) (03/08 0740) Pulse Rate:  [70-81] 70 (03/08 1143) Resp:  [30-34] 32 (03/08 0740) BP: (108-168)/(61-82) 108/61 (03/08 1143) SpO2:  [87 %-97 %] 94 % (03/08 1143) Weight:  [48.5 kg] 48.5 kg (03/08 0346)  Weight change: -0.265 kg Filed Weights   09/07/21 1106 09/13/21 0400 09/14/21 0346  Weight: 63.5 kg 48.8 kg 48.5 kg    Intake/Output: I/O last 3 completed shifts: In: 49.8 [I.V.:2332; NG/GT:2950.8; IV Piggyback:105] Out: 1 [Stool:1]   Intake/Output this shift:  No intake/output data recorded.  Physical Exam: General: NAD  Head: Normocephalic, atraumatic. Dry oral mucosal membranes, NGT  Eyes: Anicteric  Lungs:  Clear to auscultation, normal effort  Heart: Regular rate and rhythm  Abdomen:  Soft, nontender  Extremities: No peripheral edema.  Neurologic: Alert, following simple commands  Skin: No lesions  Access: None    Basic Metabolic Panel: Recent Labs  Lab 09/10/21 0846 09/11/21 0544 09/12/21 0425 09/12/21 1304 09/12/21 1715 09/13/21 0619 09/13/21 1713 09/14/21 0620  NA 152* 151* 146*  --   --  140  --  144  K 3.8 3.9 3.7  --   --  3.6  --  4.0  CL 117* 112* 109  --   --  102  --  108  CO2 26 26 28   --   --  28  --  26  GLUCOSE 149* 138* 127*  --   --  146*  --  154*  BUN 33* 30* 26*  --   --  25*  --  33*  CREATININE 0.88 0.83 0.75  --   --  0.68  --  0.68  CALCIUM 9.0 9.1 8.7*  --   --  8.8*  --  9.1  MG 2.0  --   --  1.6* 1.7 1.8 1.8  --   PHOS  --   --   --  3.1 2.9 2.8 2.6  --      Liver Function Tests: Recent Labs  Lab  09/09/21 0554 09/11/21 0544 09/12/21 0425 09/14/21 0620  AST 21 19 16 22   ALT 18 12 13 20   ALKPHOS 113 84 61 76  BILITOT 0.8 0.8 0.6 0.8  PROT 7.6 6.8 6.5 6.1*  ALBUMIN 3.2* 2.8* 2.8* 2.8*    No results for input(s): LIPASE, AMYLASE in the last 168 hours.  No results for input(s): AMMONIA in the last 168 hours.  CBC: Recent Labs  Lab 09/10/21 0546 09/11/21 0544 09/12/21 0425 09/13/21 0619 09/14/21 0620  WBC 18.3* 19.6* 17.4* 19.5* 22.8*  HGB 17.2* 17.6* 16.4* 17.3* 16.9*  HCT 50.9* 53.1* 49.6* 50.7* 49.7*  MCV 94.4 94.8 94.7 92.2 92.7  PLT 315 265 206 195 212     Cardiac Enzymes: No results for input(s): CKTOTAL, CKMB, CKMBINDEX, TROPONINI in the last 168 hours.  BNP: Invalid input(s): POCBNP  CBG: Recent Labs  Lab 09/13/21 1942 09/13/21 2347 09/14/21 0333 09/14/21 0718 09/14/21 1145  GLUCAP 157* 150* 140* 159* 149*     Microbiology: Results for orders placed or performed during  the hospital encounter of 09/07/21  Blood culture (single)     Status: None   Collection Time: 09/07/21 11:09 AM   Specimen: BLOOD LEFT ARM  Result Value Ref Range Status   Specimen Description BLOOD LEFT ARM  Final   Special Requests   Final    BOTTLES DRAWN AEROBIC AND ANAEROBIC Blood Culture adequate volume   Culture   Final    NO GROWTH 5 DAYS Performed at [facility], [address] [place], [place] [address]    Report Status 09/12/2021 FINAL  Final  Resp Panel by RT-PCR (Flu A&B, Covid) Nasopharyngeal Swab     Status: None   Collection Time: 09/07/21 11:09 AM   Specimen: Nasopharyngeal Swab; Nasopharyngeal(NP) swabs in vial transport medium  Result Value Ref Range Status   SARS Coronavirus 2 by RT PCR NEGATIVE NEGATIVE Final    Comment: (NOTE) SARS-CoV-2 target nucleic acids are NOT DETECTED.  The SARS-CoV-2 RNA is generally detectable in upper respiratory specimens during the acute phase of infection. The lowest concentration of SARS-CoV-2 viral  copies this assay can detect is 138 copies/mL. A negative result does not preclude SARS-Cov-2 infection and should not be used as the sole basis for treatment or other patient management decisions. A negative result may occur with  improper specimen collection/handling, submission of specimen other than nasopharyngeal swab, presence of viral mutation(s) within the areas targeted by this assay, and inadequate number of viral copies(<138 copies/mL). A negative result must be combined with clinical observations, patient history, and epidemiological information. The expected result is Negative.  Fact Sheet for Patients:  BloggerCourse.com  Fact Sheet for Healthcare Providers:  SeriousBroker.it  This test is no t yet approved or cleared by the [place] FDA and  has been authorized for detection and/or diagnosis of SARS-CoV-2 by FDA under an Emergency Use Authorization (EUA). This EUA will remain  in effect (meaning this test can be used) for the duration of the COVID-19 declaration under Section 564(b)(1) of the Act, 21 U.S.C.section 360bbb-3(b)(1), unless the authorization is terminated  or revoked sooner.       Influenza A by PCR NEGATIVE NEGATIVE Final   Influenza B by PCR NEGATIVE NEGATIVE Final    Comment: (NOTE) The Xpert Xpress SARS-CoV-2/FLU/RSV plus assay is intended as an aid in the diagnosis of influenza from Nasopharyngeal swab specimens and should not be used as a sole basis for treatment. Nasal washings and aspirates are unacceptable for Xpert Xpress SARS-CoV-2/FLU/RSV testing.  Fact Sheet for Patients: BloggerCourse.com  Fact Sheet for Healthcare Providers: SeriousBroker.it  This test is not yet approved or cleared by the [place] FDA and has been authorized for detection and/or diagnosis of SARS-CoV-2 by FDA under an Emergency Use Authorization (EUA). This  EUA will remain in effect (meaning this test can be used) for the duration of the COVID-19 declaration under Section 564(b)(1) of the Act, 21 U.S.C. section 360bbb-3(b)(1), unless the authorization is terminated or revoked.  Performed at [facility], [address], [place], [place] [address]   Urine Culture     Status: Abnormal   Collection Time: 09/07/21 11:23 AM   Specimen: Urine, Clean Catch  Result Value Ref Range Status   Specimen Description   Final    URINE, CLEAN CATCH Performed at [facility], [address], [place], [place] [address]    Special Requests   Final    NONE Performed at [facility], [address], [place], [place] [address]  Culture >=100,000 COLONIES/mL ESCHERICHIA COLI (A)  Final   Report Status 09/11/2021 FINAL  Final   Organism ID, Bacteria ESCHERICHIA COLI (A)  Final      Susceptibility   Escherichia coli - MIC*    AMPICILLIN >=32 RESISTANT Resistant     CEFAZOLIN <=4 SENSITIVE Sensitive     CEFEPIME <=0.12 SENSITIVE Sensitive     CEFTRIAXONE <=0.25 SENSITIVE Sensitive     CIPROFLOXACIN <=0.25 SENSITIVE Sensitive     GENTAMICIN <=1 SENSITIVE Sensitive     IMIPENEM <=0.25 SENSITIVE Sensitive     NITROFURANTOIN <=16 SENSITIVE Sensitive     TRIMETH/SULFA <=20 SENSITIVE Sensitive     AMPICILLIN/SULBACTAM >=32 RESISTANT Resistant     PIP/TAZO <=4 SENSITIVE Sensitive     * >=100,000 COLONIES/mL ESCHERICHIA COLI  C Difficile Quick Screen w PCR reflex     Status: None   Collection Time: 09/07/21  7:21 PM   Specimen: STOOL  Result Value Ref Range Status   C Diff antigen NEGATIVE NEGATIVE Final   C Diff toxin NEGATIVE NEGATIVE Final   C Diff interpretation No C. difficile detected.  Final    Comment: Performed at [facility], [address] [place], [place] [address]  Gastrointestinal Panel by PCR , Stool     Status: None   Collection Time: 09/07/21  7:21 PM   Specimen: STOOL  Result Value Ref Range  Status   Campylobacter species NOT DETECTED NOT DETECTED Final   Plesimonas shigelloides NOT DETECTED NOT DETECTED Final   Salmonella species NOT DETECTED NOT DETECTED Final   Yersinia enterocolitica NOT DETECTED NOT DETECTED Final   Vibrio species NOT DETECTED NOT DETECTED Final   Vibrio cholerae NOT DETECTED NOT DETECTED Final   Enteroaggregative E coli (EAEC) NOT DETECTED NOT DETECTED Final   Enteropathogenic E coli (EPEC) NOT DETECTED NOT DETECTED Final   Enterotoxigenic E coli (ETEC) NOT DETECTED NOT DETECTED Final   Shiga like toxin producing E coli (STEC) NOT DETECTED NOT DETECTED Final   Shigella/Enteroinvasive E coli (EIEC) NOT DETECTED NOT DETECTED Final   Cryptosporidium NOT DETECTED NOT DETECTED Final   Cyclospora cayetanensis NOT DETECTED NOT DETECTED Final   Entamoeba histolytica NOT DETECTED NOT DETECTED Final   Giardia lamblia NOT DETECTED NOT DETECTED Final   Adenovirus F40/41 NOT DETECTED NOT DETECTED Final   Astrovirus NOT DETECTED NOT DETECTED Final   Norovirus GI/GII NOT DETECTED NOT DETECTED Final   Rotavirus A NOT DETECTED NOT DETECTED Final   Sapovirus (I, II, IV, and V) NOT DETECTED NOT DETECTED Final    Comment: Performed at [facility], [address] [place], [place] [address]    Coagulation Studies: No results for input(s): LABPROT, INR in the last 72 hours.   Urinalysis: No results for input(s): COLORURINE, LABSPEC, PHURINE, GLUCOSEU, HGBUR, BILIRUBINUR, KETONESUR, PROTEINUR, UROBILINOGEN, NITRITE, LEUKOCYTESUR in the last 72 hours.  Invalid input(s): APPERANCEUR    Imaging: DG Chest Port 1 View  Result Date: 09/14/2021 CLINICAL DATA:  Short of breath EXAM: PORTABLE CHEST 1 VIEW COMPARISON:  09/10/2021 FINDINGS: Heart size and vascularity normal. Negative for heart failure. Atherosclerotic calcification aorta Mild left lower lobe airspace disease likely atelectasis. This has developed in the interval. Minimal bilateral effusions. Right  lung otherwise clear. NG tube enters the stomach with the tip not visualized. IMPRESSION: Interval development of mild left lower lobe atelectasis and minimal pleural effusion bilaterally. Negative for heart failure. Electronically Signed   By: Marlan Palau M.D.   On: 09/14/2021 10:06  Medications:    feeding supplement (OSMOLITE 1.5 CAL) 1,000 mL (09/14/21 0850)    apixaban  5 mg Oral BID   atorvastatin  40 mg Oral Daily   carvedilol  25 mg Oral BID WC   feeding supplement (PROSource TF)  45 mL Per Tube BID   free water  150 mL Per Tube Q4H   lisinopril  40 mg Oral Daily   methimazole  5 mg Oral Daily   pantoprazole (PROTONIX) IV  40 mg Intravenous Q24H   spironolactone  50 mg Oral Daily   albuterol, ondansetron **OR** ondansetron (ZOFRAN) IV, oxyCODONE-acetaminophen  Assessment/ Plan:  Ms. Joy Clark is a 71 y.o.  female a PMH significant for nicotine dependence, chronic pain syndrome, HLD who was brought to the ER with chief complaint of altered mental status.  Hypernatremia- secondary to GI losses, insensible losses, tachypnea and inability to ask for water as patient has altered mental status.     Sodium 144, monitoring closely. Will increase her free water intake to 200ml every 4 hours.   2.  Hypertension blood pressure remains elevated 178/87 Currently receiving carvedilol 25 mg twice daily, lisinopril 40 mg daily and spironolactone 50 mg daily.  Monitoring - d/c HCTZ  3.  Polycythemia    Hemoglobin has ranged from 16.2-17.6 this admission.  Remains 16.9. Patient has history of heavy tobacco use, 2 packs per day.   4.  Chronic diastolic heart failure echo completed on 09/09/2021 shows EF 60 to 65% with a grade 1 diastolic dysfunction.    IVF stopped  5.  Urinary tract infection.  Patient currently prescribed ceftriaxone     LOS: 7 Shantelle Breeze 3/8/202312:56 PM   Patient was examined and evaluated with Wendee BeaversShantelle Breeze, NP.  Plan of care was formulated and  discussed with patient as well as NP.  I agree with the note as documented with edits.

## 2021-09-15 ENCOUNTER — Inpatient Hospital Stay: Payer: Medicare Other

## 2021-09-15 DIAGNOSIS — G0490 Encephalitis and encephalomyelitis, unspecified: Secondary | ICD-10-CM | POA: Diagnosis not present

## 2021-09-15 DIAGNOSIS — I4891 Unspecified atrial fibrillation: Secondary | ICD-10-CM | POA: Diagnosis not present

## 2021-09-15 DIAGNOSIS — G9341 Metabolic encephalopathy: Secondary | ICD-10-CM | POA: Diagnosis not present

## 2021-09-15 DIAGNOSIS — E43 Unspecified severe protein-calorie malnutrition: Secondary | ICD-10-CM

## 2021-09-15 LAB — BASIC METABOLIC PANEL
Anion gap: 10 (ref 5–15)
BUN: 36 mg/dL — ABNORMAL HIGH (ref 8–23)
CO2: 25 mmol/L (ref 22–32)
Calcium: 8.8 mg/dL — ABNORMAL LOW (ref 8.9–10.3)
Chloride: 105 mmol/L (ref 98–111)
Creatinine, Ser: 0.58 mg/dL (ref 0.44–1.00)
GFR, Estimated: >60 mL/min (ref >60)
Glucose, Bld: 138 mg/dL — ABNORMAL HIGH (ref 70–99)
Potassium: 4.1 mmol/L (ref 3.5–5.1)
Sodium: 140 mmol/L (ref 135–145)

## 2021-09-15 LAB — GLUCOSE, CAPILLARY
Glucose-Capillary: 110 mg/dL — ABNORMAL HIGH (ref 70–99)
Glucose-Capillary: 114 mg/dL — ABNORMAL HIGH (ref 70–99)
Glucose-Capillary: 122 mg/dL — ABNORMAL HIGH (ref 70–99)
Glucose-Capillary: 123 mg/dL — ABNORMAL HIGH (ref 70–99)
Glucose-Capillary: 138 mg/dL — ABNORMAL HIGH (ref 70–99)
Glucose-Capillary: 140 mg/dL — ABNORMAL HIGH (ref 70–99)
Glucose-Capillary: 152 mg/dL — ABNORMAL HIGH (ref 70–99)

## 2021-09-15 LAB — CBC
HCT: 48.2 % — ABNORMAL HIGH (ref 36.0–46.0)
Hemoglobin: 16 g/dL — ABNORMAL HIGH (ref 12.0–15.0)
MCH: 31.5 pg (ref 26.0–34.0)
MCHC: 33.2 g/dL (ref 30.0–36.0)
MCV: 94.9 fL (ref 80.0–100.0)
Platelets: 218 10*3/uL (ref 150–400)
RBC: 5.08 MIL/uL (ref 3.87–5.11)
RDW: 12.3 % (ref 11.5–15.5)
WBC: 26.6 10*3/uL — ABNORMAL HIGH (ref 4.0–10.5)
nRBC: 0 % (ref 0.0–0.2)

## 2021-09-15 LAB — PROCALCITONIN: Procalcitonin: <0.1 ng/mL

## 2021-09-15 IMAGING — MR MR HEAD W/ CM
5 series · 48 of 48 positions shown · IV contrast (4ml Gadavist)
Comparison: Prior noncontrast brain MRI from earlier the same day.

CLINICAL DATA: Initial evaluation for intracranial infection.

EXAM:
MRI HEAD WITH CONTRAST
TECHNIQUE: Multiplanar, multiecho pulse sequences of the brain and surrounding
structures were obtained with intravenous contrast.
CONTRAST:  4mL GADAVIST GADOBUTROL 1 MMOL/ML IV SOLN

[Series 5: T1 · axial · 1.0mm · 0.98mm/px · z∈[-97,+75]mm · 29 of 176 slices shown (1 of 3)]
[im 1/176]
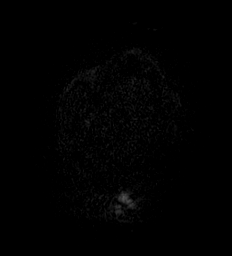
[im 7/176]
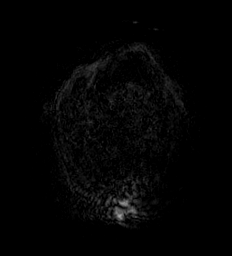
[im 13/176]
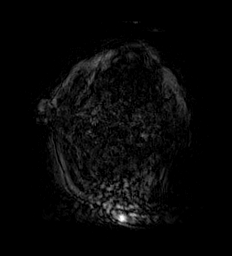
[im 19/176]
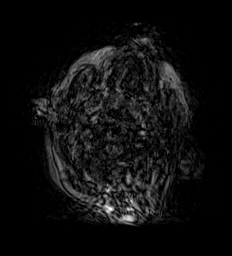
[im 26/176]
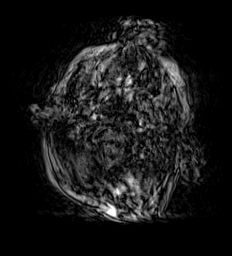
[im 32/176]
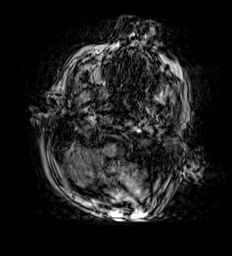
[im 38/176]
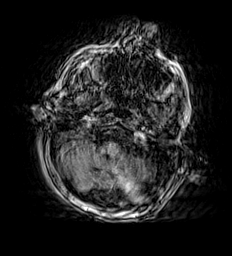
[im 44/176]
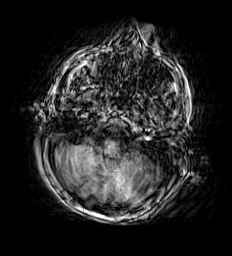
[im 51/176]
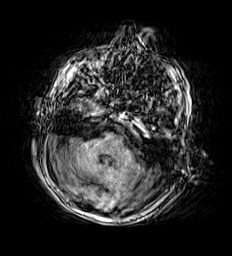
[im 57/176]
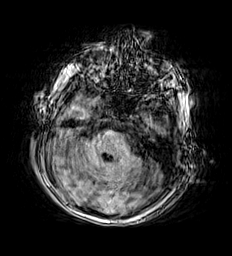
[im 63/176]
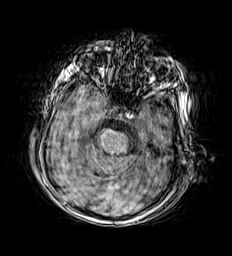
[im 69/176]
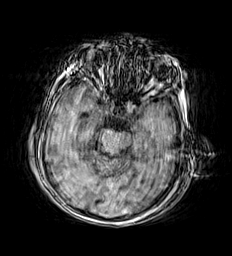
[im 76/176]
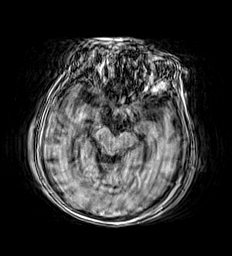
[im 82/176]
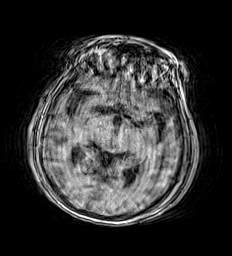
[im 88/176]
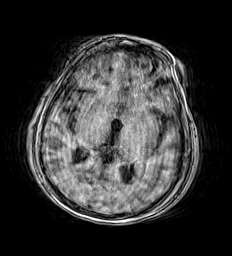
[im 94/176]
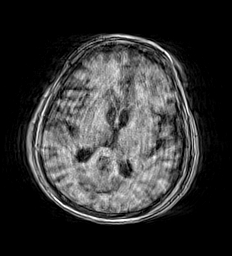
[im 101/176]
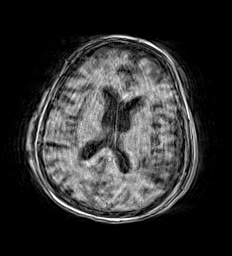
[im 107/176]
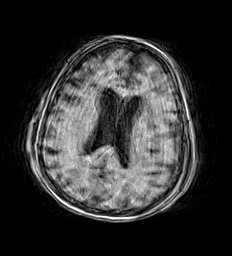
[im 113/176]
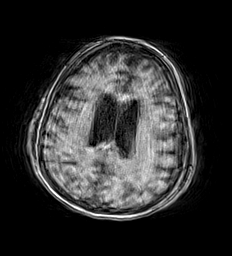
[im 119/176]
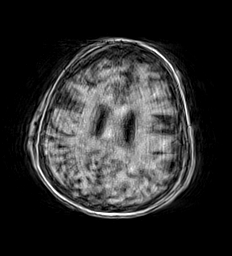
[im 126/176]
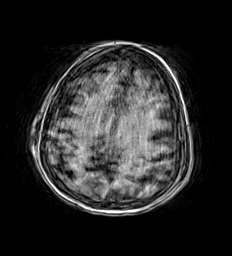
[im 132/176]
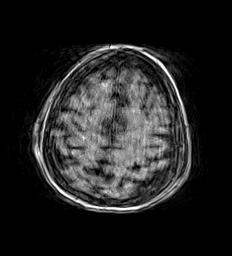
[im 138/176]
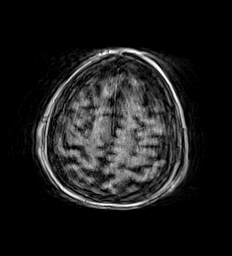
[im 144/176]
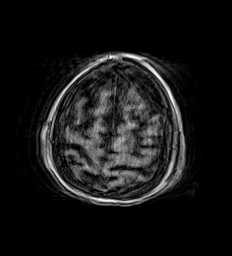
[im 151/176]
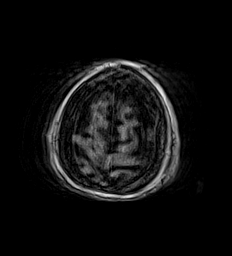
[im 157/176]
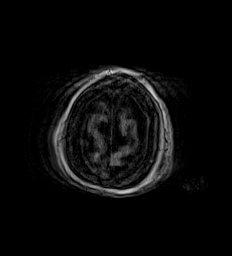
[im 163/176]
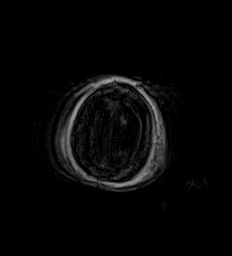
[im 169/176]
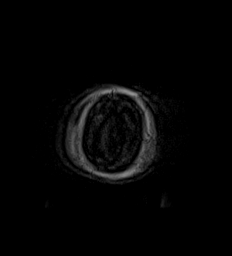
[im 176/176]
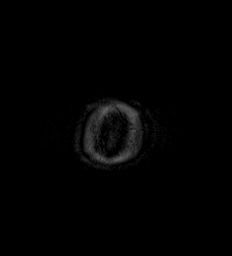

[Series 6: T1 · axial · 5.0mm · 0.90mm/px · z∈[-88,+65]mm · 5 of 27 slices shown (2 of 3)]
[im 1/27]
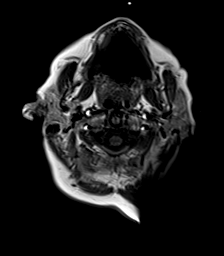
[im 7/27]
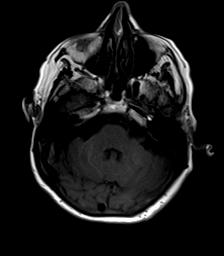
[im 14/27]
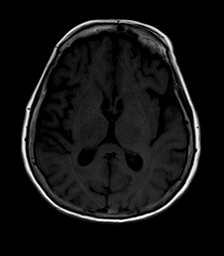
[im 20/27]
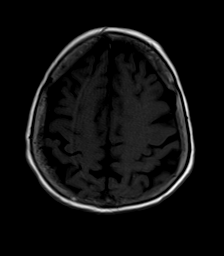
[im 27/27]
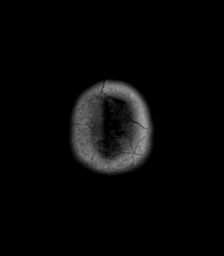

[Series 7: T1 post-contrast · axial · 5.0mm · 0.90mm/px · z∈[-88,+65]mm · 5 of 27 slices shown (1 of 2)]
[im 1/27]
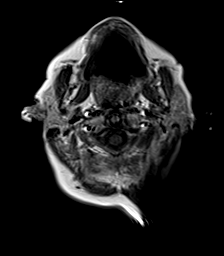
[im 7/27]
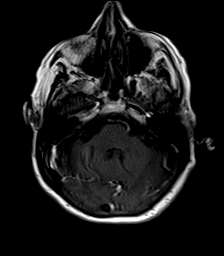
[im 14/27]
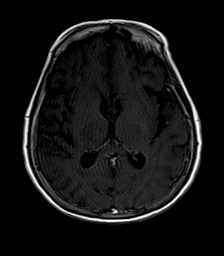
[im 20/27]
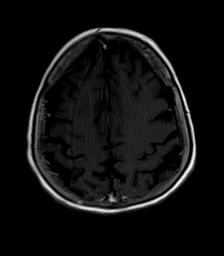
[im 27/27]
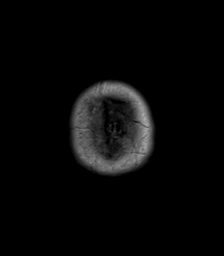

[Series 8: T1 post-contrast · coronal · 5.0mm · 0.90mm/px · 5 of 31 slices shown (2 of 2)]
[im 1/31]
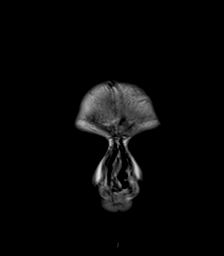
[im 8/31]
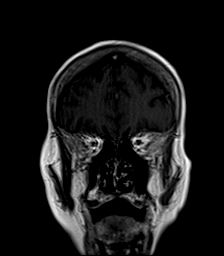
[im 16/31]
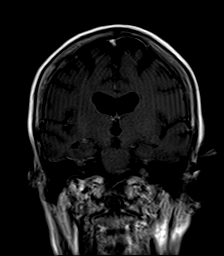
[im 23/31]
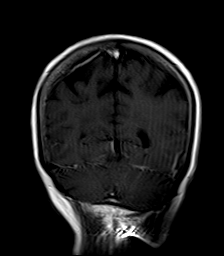
[im 31/31]
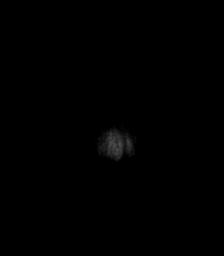

[Series 9: T1 · sagittal · 5.0mm · 0.94mm/px · 4 of 23 slices shown (3 of 3)]
[im 1/23]
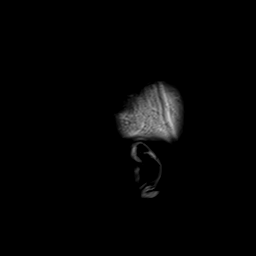
[im 8/23]
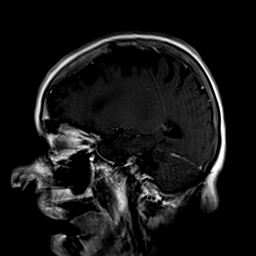
[im 15/23]
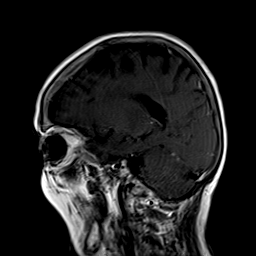
[im 23/23]
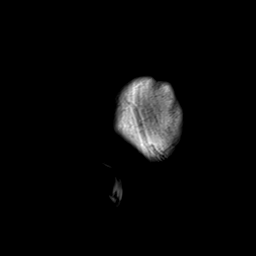

[48 of 48 positions shown; findings below may reference images not displayed]

FINDINGS: Brain: Examination moderately degraded by motion artifact.
Precontrast T1 weighted sequence demonstrates no definite new
intracranial abnormality since previous MRI from earlier the same
day. Following contrast administration, no pathologic enhancement is
seen. No visible mass lesion, mass effect or midline shift. No
hydrocephalus or extra-axial fluid collection. No other new
intracranial abnormality.

Vascular: Major intracranial vascular flow voids grossly maintained.

Skull and upper cervical spine: Craniocervical junction within
normal limits. Bone marrow signal intensity normal. No scalp soft
tissue abnormality.

Sinuses/Orbits: Globes and orbital soft tissues demonstrate no acute
finding. Paranasal sinuses and mastoid air cells are grossly clear.

Other: None.
IMPRESSION: No pathologic enhancement or MR evidence for acute intracranial
infection.

## 2021-09-15 IMAGING — MR MR HEAD W/O CM
13 series · 48 of 48 positions shown · non-contrast
Comparison: None.

CLINICAL DATA: Delirium

EXAM:
MRI HEAD WITHOUT CONTRAST
TECHNIQUE: Multiplanar, multiecho pulse sequences of the brain and surrounding
structures were obtained without intravenous contrast.

[Series 5: ax dwi_tracew · axial · 3.0mm · 1.80mm/px · z∈[-113,+36]mm · 2 of 48 slices shown]
[im 1/48]
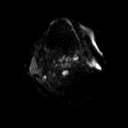
[im 48/48]
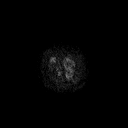

[Series 6: ax dwi_adc · axial · 3.0mm · 1.80mm/px · z∈[-113,+36]mm · 3 of 48 slices shown]
[im 1/48]
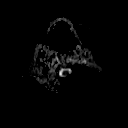
[im 24/48]
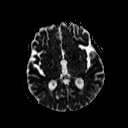
[im 48/48]
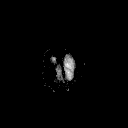

[Series 7: cor dwi_tracew · coronal · 5.0mm · 1.80mm/px · 3 of 38 slices shown]
[im 1/38]
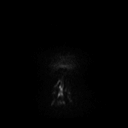
[im 19/38]
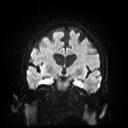
[im 38/38]
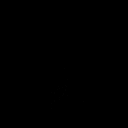

[Series 8: cor dwi_adc · coronal · 5.0mm · 1.80mm/px · 3 of 35 slices shown]
[im 1/35]
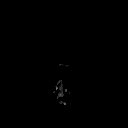
[im 18/35]
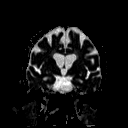
[im 35/35]
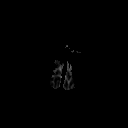

[Series 9: T1 · sagittal · 5.0mm · 0.62mm/px · 2 of 23 slices shown (1 of 2)]
[im 1/23]
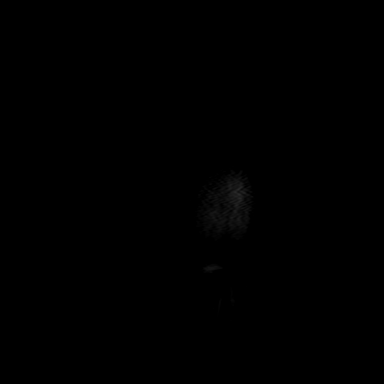
[im 23/23]
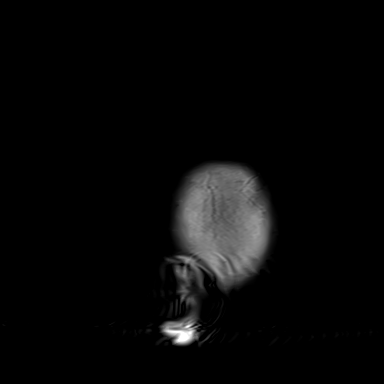

[Series 10: T2 · axial · 5.0mm · 0.86mm/px · z∈[-102,+37]mm · 2 of 25 slices shown (1 of 2)]
[im 1/25]
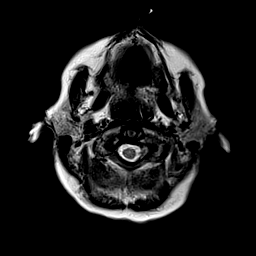
[im 25/25]
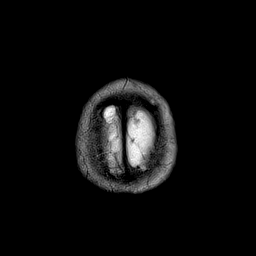

[Series 11: mag_images · axial · 3.0mm · 0.90mm/px · z∈[-107,+39]mm · 4 of 52 slices shown]
[im 1/52]
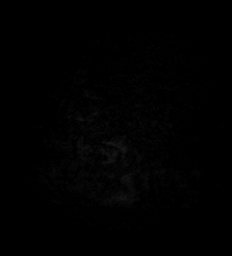
[im 18/52]
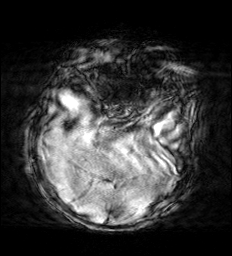
[im 35/52]
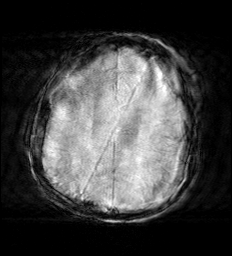
[im 52/52]
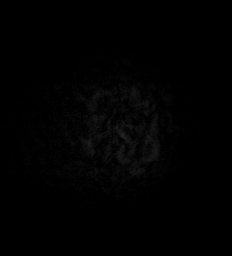

[Series 12: pha_images · axial · 3.0mm · 0.90mm/px · z∈[-104,+39]mm · 4 of 51 slices shown]
[im 1/51]
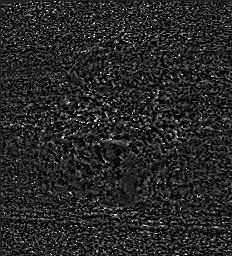
[im 17/51]
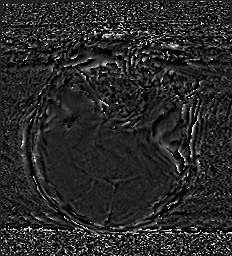
[im 34/51]
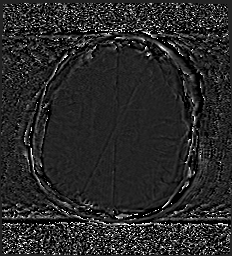
[im 51/51]
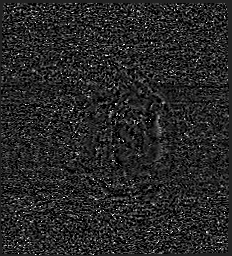

[Series 13: swi_images · axial · 3.0mm · 0.90mm/px · z∈[-107,+39]mm · 4 of 52 slices shown]
[im 1/52]
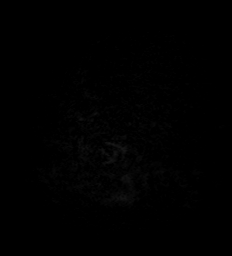
[im 18/52]
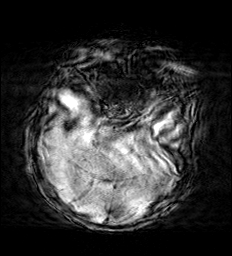
[im 35/52]
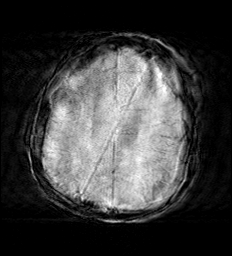
[im 52/52]
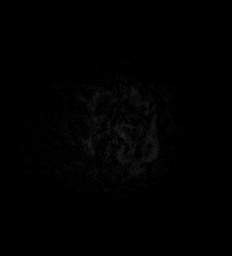

[Series 15: FLAIR · axial · 3.0mm · 0.69mm/px · z∈[-110,+45]mm · 4 of 55 slices shown (1 of 2)]
[im 1/55]
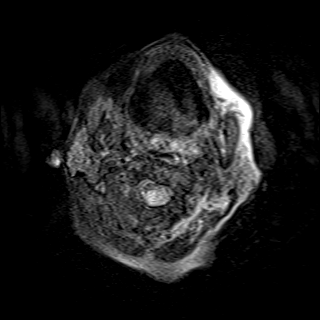
[im 19/55]
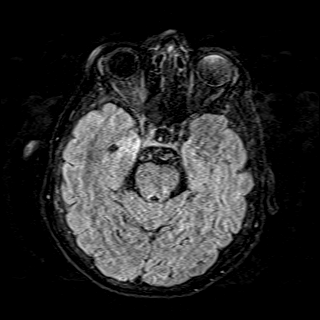
[im 37/55]
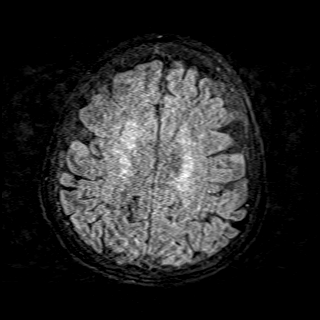
[im 55/55]
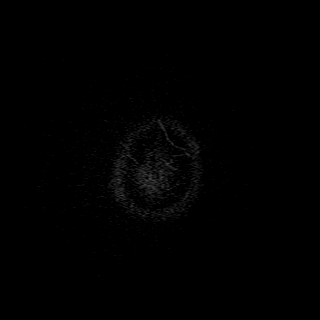

[Series 16: T1 · axial · 1.0mm · 0.98mm/px · z∈[-115,+53]mm · 13 of 176 slices shown (2 of 2)]
[im 1/176]
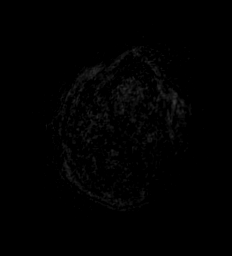
[im 15/176]
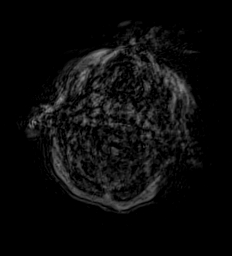
[im 30/176]
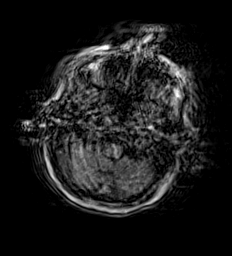
[im 44/176]
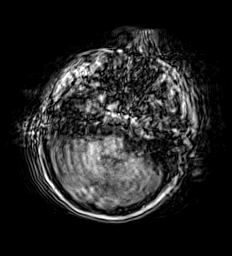
[im 59/176]
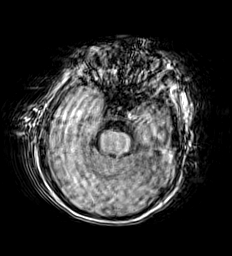
[im 73/176]
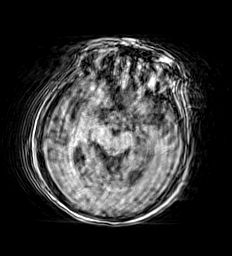
[im 88/176]
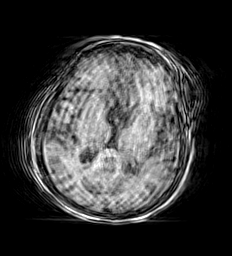
[im 103/176]
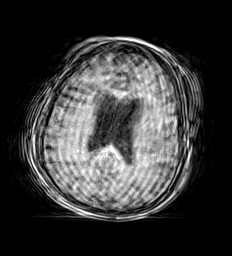
[im 117/176]
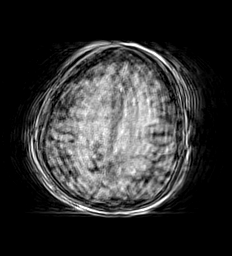
[im 132/176]
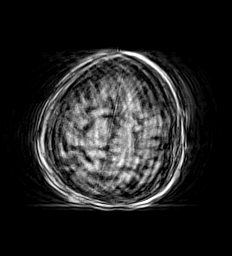
[im 146/176]
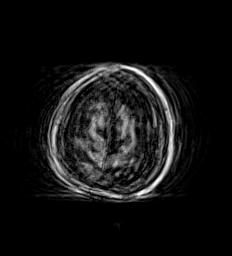
[im 161/176]
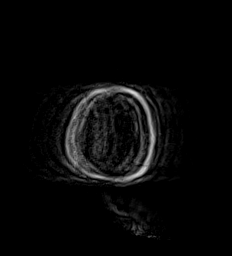
[im 176/176]
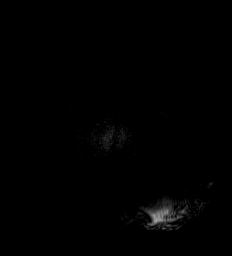

[Series 17: T2 · coronal · 5.0mm · 0.45mm/px · 2 of 31 slices shown (2 of 2)]
[im 1/31]
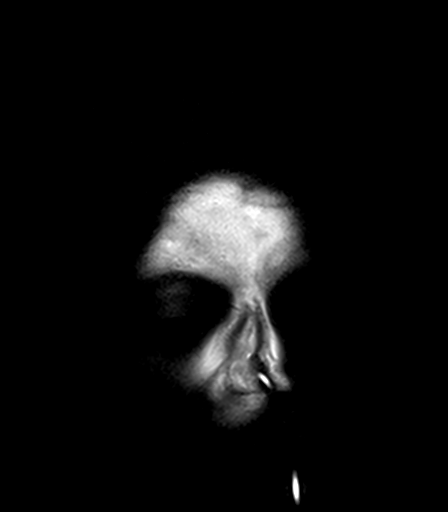
[im 31/31]
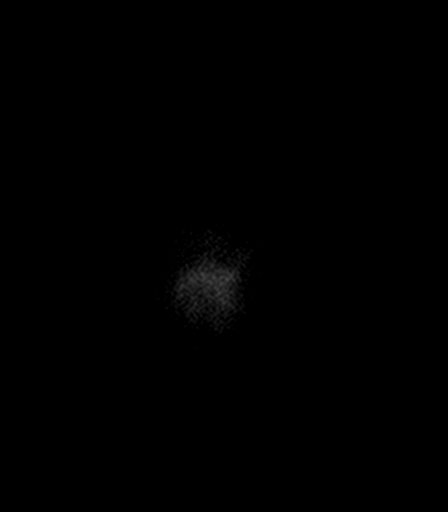

[Series 18: FLAIR · axial · 5.0mm · 1.20mm/px · z∈[-106,+44]mm · 2 of 27 slices shown (2 of 2)]
[im 1/27]
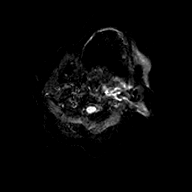
[im 27/27]
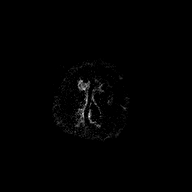

[48 of 48 positions shown; findings below may reference images not displayed]

FINDINGS: Motion artifact is present.

Brain: Small focus of mildly reduced diffusion in the left
thalamocapsular region. A punctate focus of diffusion hyperintensity
and ADC isointensity is present in the right thalamus.

No evidence of intracranial hemorrhage. There is no intracranial
mass, mass effect, or edema. There is no hydrocephalus or
extra-axial fluid collection. Prominence of the ventricles and sulci
reflects parenchymal volume loss. Patchy T2 hyperintensity in the
supratentorial white matter is nonspecific but may reflect mild to
moderate chronic microvascular ischemic changes. There is a small
chronic infarct of the right frontal white matter.

Vascular: Major vessel flow voids at the skull base are preserved.

Skull and upper cervical spine: Small lesion of the right parietal
calvarium along the inner table is probably benign.

Sinuses/Orbits: Paranasal sinuses are aerated. Orbits are
unremarkable.

Other: Sella is unremarkable.  Mastoid air cells are clear.
IMPRESSION: Small acute infarct in the left thalamocapsular region. Additional
punctate more subacute appearing right thalamic infarct.

Mild to moderate chronic microvascular ischemic changes. Small
chronic right frontal white matter infarct.

## 2021-09-15 MED ORDER — SODIUM CHLORIDE 0.9 % IV SOLN
2.0000 g | Freq: Two times a day (BID) | INTRAVENOUS | Status: DC
  Administered 2021-09-16 – 2021-09-20 (×9): 2 g via INTRAVENOUS
  Filled 2021-09-15: qty 2
  Filled 2021-09-15 (×2): qty 20
  Filled 2021-09-15: qty 2
  Filled 2021-09-15 (×2): qty 20
  Filled 2021-09-15: qty 2
  Filled 2021-09-15 (×3): qty 20

## 2021-09-15 MED ORDER — SODIUM CHLORIDE 0.9 % IV SOLN
2.0000 g | Freq: Four times a day (QID) | INTRAVENOUS | Status: DC
  Administered 2021-09-16 – 2021-09-17 (×6): 2 g via INTRAVENOUS
  Filled 2021-09-15: qty 2000
  Filled 2021-09-15: qty 2
  Filled 2021-09-15: qty 2000
  Filled 2021-09-15: qty 2
  Filled 2021-09-15 (×3): qty 2000
  Filled 2021-09-15: qty 2

## 2021-09-15 MED ORDER — VANCOMYCIN HCL IN DEXTROSE 1-5 GM/200ML-% IV SOLN
1000.0000 mg | INTRAVENOUS | Status: DC
  Administered 2021-09-16: 1000 mg via INTRAVENOUS
  Filled 2021-09-15: qty 200

## 2021-09-15 MED ORDER — DEXTROSE 5 % IV SOLN
10.0000 mg/kg | Freq: Two times a day (BID) | INTRAVENOUS | Status: DC
  Administered 2021-09-16 – 2021-09-17 (×3): 485 mg via INTRAVENOUS
  Filled 2021-09-15 (×4): qty 9.7

## 2021-09-15 MED ORDER — GADOBUTROL 1 MMOL/ML IV SOLN
4.0000 mL | Freq: Once | INTRAVENOUS | Status: AC | PRN
  Administered 2021-09-15: 23:00:00 4 mL via INTRAVENOUS

## 2021-09-15 MED ORDER — VANCOMYCIN HCL 1250 MG/250ML IV SOLN
1250.0000 mg | Freq: Once | INTRAVENOUS | Status: AC
  Administered 2021-09-15: 1250 mg via INTRAVENOUS
  Filled 2021-09-15: qty 250

## 2021-09-15 NOTE — Progress Notes (Addendum)
[facility]  ROUNDING NOTE   Subjective:   Patient seen resting in bed, eyes open Patient responds to name however will not follow simple commands nor nod yes or no No family at bedside Patient seen later with daughter at bedside Daughter repeats the same, patient will respond to name by looking your direction however will not respond beyond that.  NGT in place with tube feeds at 20ml/hr Tube feed every 4-hour flushes of 200 mL free water Sodium 140  Objective:  Vital signs in last 24 hours:  Temp:  [97.3 F (36.3 C)-98.5 F (36.9 C)] 97.3 F (36.3 C) (03/09 1210) Pulse Rate:  [72-84] 73 (03/09 1210) Resp:  [25-32] 31 (03/09 1210) BP: (101-155)/(57-71) 112/61 (03/09 1210) SpO2:  [91 %-100 %] 93 % (03/09 1210) Weight:  [48.4 kg] 48.4 kg (03/09 0348)  Weight change: -0.135 kg Filed Weights   09/13/21 0400 09/14/21 0346 09/15/21 0348  Weight: 48.8 kg 48.5 kg 48.4 kg    Intake/Output: I/O last 3 completed shifts: In: 3005 [NG/GT:3005] Out: -    Intake/Output this shift:  No intake/output data recorded.  Physical Exam: General: NAD  Head: Normocephalic, atraumatic. Dry oral mucosal membranes, NGT  Eyes: Anicteric  Lungs:  Diminished in bases, tachypnea, [place] O2  Heart: Regular rate and rhythm  Abdomen:  Soft, nontender  Extremities: No peripheral edema.  Neurologic: Alert, following simple commands  Skin: No lesions  Access: None    Basic Metabolic Panel: Recent Labs  Lab 09/10/21 0846 09/11/21 0544 09/12/21 0425 09/12/21 1304 09/12/21 1715 09/13/21 0619 09/13/21 1713 09/14/21 0620 09/15/21 0446  NA 152* 151* 146*  --   --  140  --  144 140  K 3.8 3.9 3.7  --   --  3.6  --  4.0 4.1  CL 117* 112* 109  --   --  102  --  108 105  CO2 26 26 28   --   --  28  --  26 25  GLUCOSE 149* 138* 127*  --   --  146*  --  154* 138*  BUN 33* 30* 26*  --   --  25*  --  33* 36*  CREATININE 0.88 0.83 0.75  --   --  0.68  --  0.68 0.58  CALCIUM 9.0 9.1 8.7*   --   --  8.8*  --  9.1 8.8*  MG 2.0  --   --  1.6* 1.7 1.8 1.8  --   --   PHOS  --   --   --  3.1 2.9 2.8 2.6  --   --      Liver Function Tests: Recent Labs  Lab 09/09/21 0554 09/11/21 0544 09/12/21 0425 09/14/21 0620  AST 21 19 16 22   ALT 18 12 13 20   ALKPHOS 113 84 61 76  BILITOT 0.8 0.8 0.6 0.8  PROT 7.6 6.8 6.5 6.1*  ALBUMIN 3.2* 2.8* 2.8* 2.8*    No results for input(s): LIPASE, AMYLASE in the last 168 hours.  No results for input(s): AMMONIA in the last 168 hours.  CBC: Recent Labs  Lab 09/11/21 0544 09/12/21 0425 09/13/21 0619 09/14/21 0620 09/15/21 0446  WBC 19.6* 17.4* 19.5* 22.8* 26.6*  HGB 17.6* 16.4* 17.3* 16.9* 16.0*  HCT 53.1* 49.6* 50.7* 49.7* 48.2*  MCV 94.8 94.7 92.2 92.7 94.9  PLT 265 206 195 212 218     Cardiac Enzymes: No results for input(s): CKTOTAL, CKMB, CKMBINDEX, TROPONINI in the last  168 hours.  BNP: Invalid input(s): POCBNP  CBG: Recent Labs  Lab 09/15/21 0010 09/15/21 0347 09/15/21 0652 09/15/21 0845 09/15/21 1210  GLUCAP 114* 122* 123* 152* 140*     Microbiology: Results for orders placed or performed during the hospital encounter of 09/07/21  Blood culture (single)     Status: None   Collection Time: 09/07/21 11:09 AM   Specimen: BLOOD LEFT ARM  Result Value Ref Range Status   Specimen Description BLOOD LEFT ARM  Final   Special Requests   Final    BOTTLES DRAWN AEROBIC AND ANAEROBIC Blood Culture adequate volume   Culture   Final    NO GROWTH 5 DAYS Performed at [facility], [address] [place], [place] [address]    Report Status 09/12/2021 FINAL  Final  Resp Panel by RT-PCR (Flu A&B, Covid) Nasopharyngeal Swab     Status: None   Collection Time: 09/07/21 11:09 AM   Specimen: Nasopharyngeal Swab; Nasopharyngeal(NP) swabs in vial transport medium  Result Value Ref Range Status   SARS Coronavirus 2 by RT PCR NEGATIVE NEGATIVE Final    Comment: (NOTE) SARS-CoV-2 target nucleic acids are NOT  DETECTED.  The SARS-CoV-2 RNA is generally detectable in upper respiratory specimens during the acute phase of infection. The lowest concentration of SARS-CoV-2 viral copies this assay can detect is 138 copies/mL. A negative result does not preclude SARS-Cov-2 infection and should not be used as the sole basis for treatment or other patient management decisions. A negative result may occur with  improper specimen collection/handling, submission of specimen other than nasopharyngeal swab, presence of viral mutation(s) within the areas targeted by this assay, and inadequate number of viral copies(<138 copies/mL). A negative result must be combined with clinical observations, patient history, and epidemiological information. The expected result is Negative.  Fact Sheet for Patients:  BloggerCourse.com  Fact Sheet for Healthcare Providers:  SeriousBroker.it  This test is no t yet approved or cleared by the [place] FDA and  has been authorized for detection and/or diagnosis of SARS-CoV-2 by FDA under an Emergency Use Authorization (EUA). This EUA will remain  in effect (meaning this test can be used) for the duration of the COVID-19 declaration under Section 564(b)(1) of the Act, 21 U.S.C.section 360bbb-3(b)(1), unless the authorization is terminated  or revoked sooner.       Influenza A by PCR NEGATIVE NEGATIVE Final   Influenza B by PCR NEGATIVE NEGATIVE Final    Comment: (NOTE) The Xpert Xpress SARS-CoV-2/FLU/RSV plus assay is intended as an aid in the diagnosis of influenza from Nasopharyngeal swab specimens and should not be used as a sole basis for treatment. Nasal washings and aspirates are unacceptable for Xpert Xpress SARS-CoV-2/FLU/RSV testing.  Fact Sheet for Patients: BloggerCourse.com  Fact Sheet for Healthcare Providers: SeriousBroker.it  This test is not yet  approved or cleared by the [place] FDA and has been authorized for detection and/or diagnosis of SARS-CoV-2 by FDA under an Emergency Use Authorization (EUA). This EUA will remain in effect (meaning this test can be used) for the duration of the COVID-19 declaration under Section 564(b)(1) of the Act, 21 U.S.C. section 360bbb-3(b)(1), unless the authorization is terminated or revoked.  Performed at [facility], [address] [place], [place] [address]   Urine Culture     Status: Abnormal   Collection Time: 09/07/21 11:23 AM   Specimen: Urine, Clean Catch  Result Value Ref Range Status   Specimen Description   Final  URINE, CLEAN CATCH Performed at [facility], [address] [place], [place] [address]    Special Requests   Final    NONE Performed at [facility], [address] [place], [place] [address]    Culture >=100,000 COLONIES/mL ESCHERICHIA COLI (A)  Final   Report Status 09/11/2021 FINAL  Final   Organism ID, Bacteria ESCHERICHIA COLI (A)  Final      Susceptibility   Escherichia coli - MIC*    AMPICILLIN >=32 RESISTANT Resistant     CEFAZOLIN <=4 SENSITIVE Sensitive     CEFEPIME <=0.12 SENSITIVE Sensitive     CEFTRIAXONE <=0.25 SENSITIVE Sensitive     CIPROFLOXACIN <=0.25 SENSITIVE Sensitive     GENTAMICIN <=1 SENSITIVE Sensitive     IMIPENEM <=0.25 SENSITIVE Sensitive     NITROFURANTOIN <=16 SENSITIVE Sensitive     TRIMETH/SULFA <=20 SENSITIVE Sensitive     AMPICILLIN/SULBACTAM >=32 RESISTANT Resistant     PIP/TAZO <=4 SENSITIVE Sensitive     * >=100,000 COLONIES/mL ESCHERICHIA COLI  C Difficile Quick Screen w PCR reflex     Status: None   Collection Time: 09/07/21  7:21 PM   Specimen: STOOL  Result Value Ref Range Status   C Diff antigen NEGATIVE NEGATIVE Final   C Diff toxin NEGATIVE NEGATIVE Final   C Diff interpretation No C. difficile detected.  Final    Comment: Performed at [facility], [address] [place], [place] [address]  Gastrointestinal Panel by PCR , Stool     Status: None   Collection Time: 09/07/21  7:21 PM   Specimen: STOOL  Result Value Ref Range Status   Campylobacter species NOT DETECTED NOT DETECTED Final   Plesimonas shigelloides NOT DETECTED NOT DETECTED Final   Salmonella species NOT DETECTED NOT DETECTED Final   Yersinia enterocolitica NOT DETECTED NOT DETECTED Final   Vibrio species NOT DETECTED NOT DETECTED Final   Vibrio cholerae NOT DETECTED NOT DETECTED Final   Enteroaggregative E coli (EAEC) NOT DETECTED NOT DETECTED Final   Enteropathogenic E coli (EPEC) NOT DETECTED NOT DETECTED Final   Enterotoxigenic E coli (ETEC) NOT DETECTED NOT DETECTED Final   Shiga like toxin producing E coli (STEC) NOT DETECTED NOT DETECTED Final   Shigella/Enteroinvasive E coli (EIEC) NOT DETECTED NOT DETECTED Final   Cryptosporidium NOT DETECTED NOT DETECTED Final   Cyclospora cayetanensis NOT DETECTED NOT DETECTED Final   Entamoeba histolytica NOT DETECTED NOT DETECTED Final   Giardia lamblia NOT DETECTED NOT DETECTED Final   Adenovirus F40/41 NOT DETECTED NOT DETECTED Final   Astrovirus NOT DETECTED NOT DETECTED Final   Norovirus GI/GII NOT DETECTED NOT DETECTED Final   Rotavirus A NOT DETECTED NOT DETECTED Final   Sapovirus (I, II, IV, and V) NOT DETECTED NOT DETECTED Final    Comment: Performed at [facility], [address] [place], [place] [address]    Coagulation Studies: No results for input(s): LABPROT, INR in the last 72 hours.   Urinalysis: No results for input(s): COLORURINE, LABSPEC, PHURINE, GLUCOSEU, HGBUR, BILIRUBINUR, KETONESUR, PROTEINUR, UROBILINOGEN, NITRITE, LEUKOCYTESUR in the last 72 hours.  Invalid input(s): APPERANCEUR    Imaging: MR BRAIN WO CONTRAST  Result Date: 09/15/2021 CLINICAL DATA:  Delirium EXAM: MRI HEAD WITHOUT CONTRAST TECHNIQUE: Multiplanar, multiecho pulse sequences of the brain and surrounding structures were  obtained without intravenous contrast. COMPARISON:  None. FINDINGS: Motion artifact is present. Brain: Small focus of mildly reduced diffusion in the left thalamocapsular region. A punctate focus of diffusion  hyperintensity and ADC isointensity is present in the right thalamus. No evidence of intracranial hemorrhage. There is no intracranial mass, mass effect, or edema. There is no hydrocephalus or extra-axial fluid collection. Prominence of the ventricles and sulci reflects parenchymal volume loss. Patchy T2 hyperintensity in the supratentorial white matter is nonspecific but may reflect mild to moderate chronic microvascular ischemic changes. There is a small chronic infarct of the right frontal white matter. Vascular: Major vessel flow voids at the skull base are preserved. Skull and upper cervical spine: Small lesion of the right parietal calvarium along the inner table is probably benign. Sinuses/Orbits: Paranasal sinuses are aerated. Orbits are unremarkable. Other: Sella is unremarkable.  Mastoid air cells are clear. 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 Additional punctate more subacute appearing right thalamic infarct. Mild to moderate chronic microvascular ischemic changes. 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 Electronically Signed   By: Guadlupe SpanishPraneil  Patel M.D.   On: 09/15/2021 14:26   DG Chest Port 1 View  Result Date: 09/14/2021 CLINICAL DATA:  Short of breath EXAM: PORTABLE CHEST 1 VIEW COMPARISON:  09/10/2021 FINDINGS: Heart size and vascularity normal. Negative for heart failure. Atherosclerotic calcification aorta Mild left lower lobe airspace disease likely atelectasis. This has developed in the interval. Minimal bilateral effusions. Right lung otherwise clear. NG tube enters the stomach with the tip not visualized. IMPRESSION: Interval development of mild left lower lobe atelectasis and minimal pleural effusion bilaterally. Negative for heart failure.  Electronically Signed   By: Marlan Palauharles  Clark M.D.   On: 09/14/2021 10:06     Medications:    feeding supplement (OSMOLITE 1.5 CAL) 1,000 mL (09/15/21 0803)    apixaban  5 mg Oral BID   atorvastatin  40 mg Oral Daily   carvedilol  25 mg Oral BID WC   feeding supplement (PROSource TF)  45 mL Per Tube BID   free water  200 mL Per Tube Q4H   lisinopril  40 mg Oral Daily   methimazole  5 mg Oral Daily   pantoprazole (PROTONIX) IV  40 mg Intravenous Q24H   spironolactone  50 mg Oral Daily   albuterol, ondansetron **OR** ondansetron (ZOFRAN) IV, oxyCODONE-acetaminophen  Assessment/ Plan:  Ms. Joy Clark is a 71 y.o.  female a PMH significant for nicotine dependence, chronic pain syndrome, HLD who was brought to the ER with chief complaint of altered mental status.  Hypernatremia- secondary to GI losses, insensible losses, tachypnea and inability to ask for water as patient has altered mental status.     Sodium improved to 140 with slight increase of free water flushes.  We will continue to monitor  2.  Hypertension  - Currently receiving carvedilol 25 mg twice daily, lisinopril 40 mg daily and spironolactone 50 mg daily.  Blood pressure 112/61. Monitoring   3.  Polycythemia    Hemoglobin has ranged from 16.2-17.6 this admission.   hemoglobin 16.0. Patient has history of heavy tobacco use, 2 packs per day.   4.  Chronic diastolic heart failure echo completed on 09/09/2021 shows EF 60 to 65% with a grade 1 diastolic dysfunction.      5.  Urinary tract infection. completed IV antibiotic therapy - E Coli in urine culture on 09/08/21    LOS: 8 Joy Clark 3/9/20232:46 PM   Patient was examined and evaluated with Wendee BeaversShantelle Breeze, NP.  Plan of care was formulated and discussed with patient as well as NP.  I agree with the note as documented with  edits.

## 2021-09-15 NOTE — Consult Note (Signed)
Pharmacy Antibiotic Note ? ?Joy Clark is a 71 y.o. female admitted on 09/07/2021 with meningitis.  Pharmacy has been consulted for vancomycin dosing. Neuro is concern for meningitis/encephalitis.  ? ?Plan: ?Will load the patient with vancomycin 1250 mg x 1 followed by 1000 mg q24H based on [place] vancomycin nomogram. Can't not use AUC dosing due to indication. Plan to obtain tough level prior to the 4th or 5th dose. Tough goal 15-20.  ? ?Start ceftriaxone 2 g q12H  ? ?Start acyclovir 10 mg/kg q12H based on renal function  ? ?Start ampicillin 2 g q6H based on renal function.  ? ?Height: 5\' 10"  (177.8 cm) ?Weight: 48.4 kg (106 lb 11.2 oz) ?IBW/kg (Calculated) : 68.5 ? ?Temp (24hrs), Avg:97.6 ?F (36.4 ?C), Min:97.3 ?F (36.3 ?C), Max:98.1 ?F (36.7 ?C) ? ?Recent Labs  ?Lab 09/11/21 ?11/11/21 09/12/21 ?0425 09/13/21 ?11/13/21 09/14/21 ?11/14/21 09/15/21 ?11/15/21  ?WBC 19.6* 17.4* 19.5* 22.8* 26.6*  ?CREATININE 0.83 0.75 0.68 0.68 0.58  ?  ?Estimated Creatinine Clearance: 49.3 mL/min (by C-G formula based on SCr of 0.58 mg/dL).   ? ?Not on File ? ?Antimicrobials this admission: ?3/9 vancomycin >>  ?3/9 ceftriaxone >>  ?3/9 ampicillin >>  ?3/9 acyclovir >>  ? ?Dose adjustments this admission: ?None ? ?Microbiology results: ?3/9 UCx: pending  ? ? ?Thank you for allowing pharmacy to be a part of this patient?s care. ? ?5/9, PharmD, BCPS ?09/15/2021 10:07 PM ? ?

## 2021-09-15 NOTE — Consult Note (Signed)
NEURO HOSPITALIST CONSULT NOTE   Requestig physician: Dr. Jimmye Norman  Reason for Consult: Acute and subacute lacunar strokes seen on MRI brain  History obtained from:  Daughter and Chart     HPI:                                                                                                                                          Jeree Delcid is an 71 y.o. female with a PMHx of fibromyalgia, nicotine dependence, chronic pain syndrome and dyslipidemia who was admitted to the hospital on 3/1 for evaluation of AMS during the previous 4 days. At baseline the patient is fully independent, drives, is ambulatory and manages all of her own affairs. She had been visiting family in [place] after driving her from her home in [place]. The patient's symptom onset had been accompanied by chills and multiple episodes of emesis and diarrhea with significantly decreased oral intake. Her stool composition had begun to include lots of mucus. On presentation to the hospital she was awake and oriented to person, but unable to follow commands. She could nod her head yes when asked if nauseous, but otherwise was with little ability to interact. WBC on presentation was 13.7. CT of abdomen had shown findings suggestive of inflammatory or infectious enterocolitis. Presentation also was consistent with UTI, bronchitis/COPD exacerbation and gallbladder hydrops. She was diagnosed with a-fib with RVR in the setting of the above and started on IV heparin. Her encephalopathy was also thought to be due to a combination of the above factors. Flu and Covid PCR tests were negative.   MRI was obtained today to assess for a possible CNS lesion as the etiology for her persistent encephalopathy. MRI revealed a small acute infarct in the left thalamocapsular region and an additional  punctate more subacute-appearing right thalamic infarct. A background of mild to moderate chronic microvascular ischemic changes and a small  chronic right frontal white matter infarct was noted.  Her white count has steadily increased this admission and is now at 26.6 K with neutrophilic predominance. Blood culture shows no growth x 5 days. Urine culture on the day of admission showed > 100,000 cfu of E.coli; repeat urine culture started today is with results pending.     Past Medical History:  Diagnosis Date   Fibromyalgia     History reviewed. No pertinent surgical history.  History reviewed. No pertinent family history.           Social History:  reports that she has been smoking cigarettes. She does not have any smokeless tobacco history on file. No history on file for alcohol use and drug use.  Not on File  MEDICATIONS:  Scheduled:  apixaban  5 mg Oral BID   atorvastatin  40 mg Oral Daily   carvedilol  25 mg Oral BID WC   feeding supplement (PROSource TF)  45 mL Per Tube BID   free water  200 mL Per Tube Q4H   lisinopril  40 mg Oral Daily   methimazole  5 mg Oral Daily   pantoprazole (PROTONIX) IV  40 mg Intravenous Q24H   spironolactone  50 mg Oral Daily   Continuous:  feeding supplement (OSMOLITE 1.5 CAL) 1,000 mL (09/15/21 0803)     ROS:                                                                                                                                       Unable to obtain due to AMS.    Blood pressure 112/61, pulse 73, temperature (!) 97.3 F (36.3 C), resp. rate (!) 31, height _0  (1.778 m), weight 48.4 kg, SpO2 93 %.   General Examination:                                                                                                       Physical Exam  General: Cachectic appearing. On tube feeds.  HEENT-  Carlos/AT. Viscous yellow mucus pooling on tongue noted.  .   Lungs- Steady tachypneic breathing pattern with King in place. No increased WOB.  Extremities- No edema.    Neurological Examination Mental Status: Awake with eyes open and will glance at examiner at times. Nonverbal with no attempts to communicate except in response to noxious stimuli she will softly say "ow", furrow brow and look at examiner. Does not follow any commands.  Cranial Nerves: II: Blinks to threat bilaterally. PERRL.   III,IV, VI: No ptosis. Eyes are conjugate. Will track examiner to left and right as he walks from side to side. No nystagmus.  V: Blinks to eyelid stimulation bilaterally VII: Face is flaccidly symmetric.  VIII: Will glance towards examiner in response to loud calling of her name IX,X: Unable to assess. Deferred gag reflex due to aspiration concerns.  XI: Head is midline XII: Does not protrude tongue. Motor/Sensory: BUE. Decreased tone and diffusely decreased muscle bulk. Will move arms slightly to noxious pinch but without antigravity movement.  BLE: Briskly withdraws to noxious stimuli bilaterally. Decreased tone.  Diffusely decreased muscle bulk in all 4 extremities.  Does not follow any commands for motor testing.  Deep Tendon Reflexes: 1+ bilateral brachioradialis. 0 bilateral patellae.  Plantars: Right: downgoing   Left: downgoing Cerebellar/Gait: Unable to assess   Lab Results: Basic Metabolic Panel: Recent Labs  Lab 09/10/21 0846 09/11/21 0544 09/12/21 0425 09/12/21 1304 09/12/21 1715 09/13/21 0619 09/13/21 1713 09/14/21 0620 09/15/21 0446  NA 152* 151* 146*  --   --  140  --  144 140  K 3.8 3.9 3.7  --   --  3.6  --  4.0 4.1  CL 117* 112* 109  --   --  102  --  108 105  CO2 _0 --   --  28  --  26 25  GLUCOSE 149* 138* 127*  --   --  146*  --  154* 138*  BUN 33* 30* 26*  --   --  25*  --  33* 36*  CREATININE 0.88 0.83 0.75  --   --  0.68  --  0.68 0.58  CALCIUM 9.0 9.1 8.7*  --   --  8.8*  --  9.1 8.8*  MG 2.0  --   --  1.6* 1.7 1.8 1.8  --   --   PHOS  --   --   --  3.1 2.9 2.8 2.6  --   --     CBC: Recent Labs  Lab  09/11/21 0544 09/12/21 0425 09/13/21 0619 09/14/21 0620 09/15/21 0446  WBC 19.6* 17.4* 19.5* 22.8* 26.6*  HGB 17.6* 16.4* 17.3* 16.9* 16.0*  HCT 53.1* 49.6* 50.7* 49.7* 48.2*  MCV 94.8 94.7 92.2 92.7 94.9  PLT 265 206 195 212 218    Cardiac Enzymes: No results for input(s): CKTOTAL, CKMB, CKMBINDEX, TROPONINI in the last 168 hours.  Lipid Panel: No results for input(s): CHOL, TRIG, HDL, CHOLHDL, VLDL, LDLCALC in the last 168 hours.  Imaging: MR BRAIN WO CONTRAST  Result Date: 09/15/2021 CLINICAL DATA:  Delirium EXAM: MRI HEAD WITHOUT CONTRAST TECHNIQUE: Multiplanar, multiecho pulse sequences of the brain and surrounding structures were obtained without intravenous contrast. COMPARISON:  None. FINDINGS: Motion artifact is present. Brain: Small focus of mildly reduced diffusion in the left thalamocapsular region. A punctate focus of diffusion hyperintensity and ADC isointensity is present in the right thalamus. No evidence of intracranial hemorrhage. There is no intracranial mass, mass effect, or edema. There is no hydrocephalus or extra-axial fluid collection. Prominence of the ventricles and sulci reflects parenchymal volume loss. Patchy T2 hyperintensity in the supratentorial white matter is nonspecific but may reflect mild to moderate chronic microvascular ischemic changes. There is a small chronic infarct of the right frontal white matter. Vascular: Major vessel flow voids at the skull base are preserved. Skull and upper cervical spine: Small lesion of the right parietal calvarium along the inner table is probably benign. Sinuses/Orbits: Paranasal sinuses are aerated. Orbits are unremarkable. Other: Sella is unremarkable.  Mastoid air cells are clear. 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 Additional punctate more subacute appearing right thalamic infarct. Mild to moderate chronic microvascular ischemic changes. 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  Electronically Signed   By: Macy Mis M.D.   On: 09/15/2021 14:26   DG Chest Port 1 View  Result Date: 09/14/2021 CLINICAL DATA:  Short of breath EXAM: PORTABLE CHEST 1 VIEW COMPARISON:  09/10/2021 FINDINGS: Heart size and vascularity normal. Negative for heart failure. Atherosclerotic calcification aorta Mild left lower lobe airspace disease likely atelectasis. This has developed in the interval. Minimal bilateral effusions. Right lung otherwise clear. NG tube enters the stomach with the  tip not visualized. IMPRESSION: Interval development of mild left lower lobe atelectasis and minimal pleural effusion bilaterally. Negative for heart failure. Electronically Signed   By: Franchot Gallo M.D.   On: 09/14/2021 10:06     Assessment: 71 year old female with AMS. 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  1. Exam reveals a severely encephalopathic patient who is nonverbal and not following any commands.  2. MRI brain without contrast: Small acute infarct in the left thalamocapsular region. Additional punctate more subacute appearing right thalamic infarct. Mild to moderate chronic microvascular ischemic changes. 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 3. TTE shows LVEF of 60-65% with normal LV function. No mural thrombus or valvular vegetation mentioned in the report.  4. 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 Encephalopathy cannot be explained by her stroke and it is too severe to be solely due to UTI given lack of improvement with ABX. An unusual etiology such as an encephalitis secondary to viral infection involving 2 or more organ systems (e.g. lung, GI tract and brain) is felt to be a significant differential diagnostic consideration. Subclinical seizures possible but felt to be unlikely. Bickerstaff's brainstem encephalitis and cerebral vasculitis  can both be associated with ulcerative colitis in rare instances affecting level of consciousness together with GI complaints and even cold symptoms. Cytomegalovirus colitis can be accompanied by encphalopathy. Seasonally, given the cold weather, would not expect an insect-borne viral infection during this time of the year. Lymphocytic choriomeningits virus secondary to transmission from mice could present with her sequence of symptoms.   Recommendations: 1. EEG (ordered) 2. Lumbar puncture under fluoroscopy with sedation (will need fluoro with sedation due to agitation to moderate noxious stimuli on exam and inability to follow commands). Obtain cell count with differential, protein, glucose, cryptococcal antigen, fungal culture, fungal stain, AFB smear and culture, bacterial culture and gram stain, IgG index, as well as PCR for HSV, CMV, HIV and VZV. Save 5 to 10 cc additional fluid for possible future ID tests.  3. Add on MRI brain with contrast to assess for possible abnormal meningeal or brain parenchymal enhancement (ordered). eGFR is normal.  4. Starting empiric meningitis-dose antibiotics. Ceftriaxone, vancomycin, ampicillin and acyclovir (appreciate Pharmacy assistance).  5. Continue apixaban for stroke prophylaxis in the setting of a-fib over the long term. However, in order to obtain LP, will need to hold apixaban, cover with IV heparin and then hold IV heparin for 4 hours prior to LP. Can then restart heparin 2 hours after LP. DOACs can be restarted six to eight hours after an atraumatic LP if there is no blood or xanthochromia in the CSF to suggest bleeding.  6. Anti-GQ1b antibodies (ordered as [facility] send out) 7. HIV testing (ordered) 8. Serum CMV PCR (ordered) 9. ID consult for their opinion on possible other sources of infection given her increasingly elevated WBC with neutrophilic predominance since admission despite treatment of her UTI.  10. Lyme titer from CSF ordered (pending  sample) 11. Lymphocytic choriomeningitis virus IgG and IgM (ordered as [facility] send out)  Electronically signed: Dr. Kerney Elbe 09/15/2021, 3:37 PM

## 2021-09-15 NOTE — Progress Notes (Signed)
Physical Therapy Treatment ?Patient Details ?Name: Joy Clark ?MRN: [id number] ?DOB: 25-Apr-1951 ?Today's Date: 09/15/2021 ? ? ?History of Present Illness Niralya Ohanian is a 71 y.o. female with medical history significant for nicotine dependence, chronic pain syndrome, dyslipidemia who was brought into the ER by EMS for evaluation of mental status changes for about 4 days. ? ?  ?PT Comments  ? ? Pt sleeping in bed upon PT arrival (pt receiving bed bath from NT; NT requesting assist with bed mobility for pt care).  Logrolling L and R in bed max to total assist x1 with vc's, tactile cues, and hand over hand cueing (pt did not follow any cueing during session; spontaneous LE movement noted 1x; pt occasionally briefly opening her eyes but unable to stay alert--appeared to be sleeping otherwise).  Will continue to attempt to increase alertness and participation in therapy to assist with strengthening, balance, and progressive functional mobility. ?   ?Recommendations for follow up therapy are one component of a multi-disciplinary discharge planning process, led by the attending physician.  Recommendations may be updated based on patient status, additional functional criteria and insurance authorization. ? ?Follow Up Recommendations ? Skilled nursing-short term rehab (<3 hours/day) ?  ?  ?Assistance Recommended at Discharge Frequent or constant Supervision/Assistance  ?Patient can return home with the following Two people to help with walking and/or transfers;Two people to help with bathing/dressing/bathroom;Direct supervision/assist for medications management;Assistance with feeding;Assistance with cooking/housework;Assist for transportation;Help with stairs or ramp for entrance ?  ?Equipment Recommendations ? None recommended by PT  ?  ?Recommendations for Other Services   ? ? ?  ?Precautions / Restrictions Precautions ?Precautions: Fall ?Precaution Comments: Aspiration ?Restrictions ?Weight Bearing Restrictions: No  ?   ? ?Mobility ? Bed Mobility ?Overal bed mobility: Needs Assistance ?Bed Mobility: Rolling ?Rolling: Max assist, Total assist (logrolling L and R in bed) ?  ?  ?  ?  ?General bed mobility comments: vc's, tactile cues, hand over hand cueing ?  ? ?Transfers ?  ?  ?  ?  ?  ?  ?  ?  ?  ?  ?  ? ?Ambulation/Gait ?  ?  ?  ?  ?  ?  ?  ?  ? ? ?Stairs ?  ?  ?  ?  ?  ? ? ?Wheelchair Mobility ?  ? ?Modified Rankin (Stroke Patients Only) ?  ? ? ?  ?Balance   ?  ?  ?  ?  ?  ?  ?  ?  ?  ?  ?  ?  ?  ?  ?  ?  ?  ?  ?  ? ?  ?Cognition Arousal/Alertness: Lethargic ?Behavior During Therapy: Flat affect ?Overall Cognitive Status: Impaired/Different from baseline ?  ?  ?  ?  ?  ?  ?  ?  ?  ?  ?  ?  ?  ?  ?  ?  ?General Comments: occasionally briefly opening her eyes ?  ?  ? ?  ?Exercises   ? ?  ?General Comments  Nursing cleared pt for participation in physical therapy.  Pt's daughter present during session. ?  ?  ? ?Pertinent Vitals/Pain Pain Assessment ?Pain Assessment: Faces ?Pain Score: 0-No pain ?Pain Intervention(s): Limited activity within patient's tolerance, Monitored during session, Repositioned ?Vitals stable and WFL throughout treatment session (on 2 L O2 via nasal cannula).  ? ? ?Home Living   ?  ?  ?  ?  ?  ?  ?  ?  ?  ?   ?  ?  Prior Function    ?  ?  ?   ? ?PT Goals (current goals can now be found in the care plan section) Acute Rehab PT Goals ?Patient Stated Goal: "go home" ?PT Goal Formulation: Patient unable to participate in goal setting ?Time For Goal Achievement: 09/24/21 ?Potential to Achieve Goals: Fair ?Progress towards PT goals: Not progressing toward goals - comment (d/t level of alertness) ? ?  ?Frequency ? ? ? Min 2X/week ? ? ? ?  ?PT Plan Current plan remains appropriate  ? ? ?Co-evaluation   ?  ?  ?  ?  ? ?  ?AM-PAC PT "6 Clicks" Mobility   ?Outcome Measure ? Help needed turning from your back to your side while in a flat bed without using bedrails?: A Lot ?Help needed moving from lying on your back to sitting  on the side of a flat bed without using bedrails?: Total ?Help needed moving to and from a bed to a chair (including a wheelchair)?: Total ?Help needed standing up from a chair using your arms (e.g., wheelchair or bedside chair)?: Total ?Help needed to walk in hospital room?: Total ?Help needed climbing 3-5 steps with a railing? : Total ?6 Click Score: 7 ? ?  ?End of Session Equipment Utilized During Treatment: Oxygen ?Activity Tolerance: Patient limited by lethargy ?Patient left: in bed;with call bell/phone within reach;with bed alarm set;with family/visitor present;with nursing/sitter in room;Other (comment) (B UE's elevated on pillows; B heels floating via pillow support) ?Nurse Communication: Mobility status;Precautions;Other (comment) (Level of alertness) ?PT Visit Diagnosis: Muscle weakness (generalized) (M62.81);Other abnormalities of gait and mobility (R26.89);Unsteadiness on feet (R26.81) ?  ? ? ?Time: 7893-8101 ?PT Time Calculation (min) (ACUTE ONLY): 18 min ? ?Charges:  $Therapeutic Activity: 8-22 mins          ?          ?Hendricks Limes, PT ?09/15/21, 12:00 PM ? ? ?

## 2021-09-15 NOTE — Progress Notes (Addendum)
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 Start neuro checks. Neuro consulted.   Acute metabolic encephalopathy: no hx of dementia as per pt's son. Continue w/ supportive care. Repeat CXR shows atelectasis & neg for heart heart. Encourage incentive spirometry. Unchanged from day prior. MRI brain ordered  Dysphagia: continue on tube feeds    A. fib: w/ RVR. New onset. Back in sinus rhythm. Continue on eliquis, coreg     Low TSH:  signs of hyperthyroid. Possible Grave's disease. Thyroid US did not show suspicious nodules. T4 elevated, T3 normal. Continue on coreg, methimazole. Will need to f/u outpatient w/ endocrinology   HTN: uncontrolled. Continue on coreg, aldactone, lisinopril e    Potential for opioid withdrawal: family endorses that patient takes large amount of percocet scheduled for LE pain. Percocet prn    Gallbladder disease: no urgent surgical intervention as per gen surg.   Leukocytosis: completed abx. Continues to trend up. No fevers. Procal <0.10    Hypokalemia: WNL today   Hypernatremia: resolved    UTI: completed abx course    Enterocolitis: likely viral vs inflammatory. GI PCR panel, c. diff both neg   Likely moderate protein calorie malnutriton: continue w/ tube feeds    DVT  prophylaxis: eliquis  Code Status:  full  Family Communication: discussed pt's care w/ pt's son, Sharia Reeve, and answered his questions  Disposition Plan:  possibly d/c to SNF   Level of care: Progressive  Status is: Inpatient Remains inpatient appropriate because: still has NG tube in place & still w/ AMS    Consultants:  Nephro   Procedures:   Antimicrobials:   Subjective: Pt makes eye contact but does not answer any questions   Objective: Vitals:   09/14/21 1540 09/14/21 1952 09/15/21 0011 09/15/21 0348  BP: 118/62 (!) 101/57 138/71 (!) 151/71  Pulse: 74 72 73 79  Resp:  (!) 25 (!) 28 (!) 25  Temp: 98.5 F (36.9 C) 97.7 F (36.5 C) (!) 97.3 F (36.3 C) 97.6 F (36.4 C)  TempSrc:  Oral Oral   SpO2: 92% 94% 100% 96%  Weight:    48.4 kg  Height:        Intake/Output Summary (Last 24 hours) at 09/15/2021 0748 Last data filed at 09/15/2021 0400 Gross per 24 hour  Intake 1169.67 ml  Output --  Net 1169.67 ml   Filed Weights   09/13/21 0400 09/14/21 0346 09/15/21 0348  Weight: 48.8 kg 48.5 kg 48.4 kg    Examination:  General exam: Appears comfortable. Frail appearing  Respiratory system: decreased breath sounds b/l  Cardiovascular system: S1/S2+. No rubs or clicks.  Gastrointestinal system: Abd is soft, NT, ND & hypoactive bowel sounds Central nervous system: Alert and awake. Moves all extremities Psychiatry: Judgement and insight appears  poor. Flat mood and affect     Data Reviewed: I have personally reviewed following labs and imaging studies  CBC: Recent Labs  Lab 09/11/21 0544 09/12/21 0425 09/13/21 0619 09/14/21 0620 09/15/21 0446  WBC 19.6* 17.4* 19.5* 22.8* 26.6*  HGB 17.6* 16.4* 17.3* 16.9* 16.0*  HCT 53.1* 49.6* 50.7* 49.7* 48.2*  MCV 94.8 94.7 92.2 92.7 94.9  PLT 265 206 195 212 218   Basic Metabolic Panel: Recent Labs  Lab 09/10/21 0846 09/11/21 0544 09/12/21 0425 09/12/21 1304 09/12/21 1715 09/13/21 0619 09/13/21 1713 09/14/21 0620  09/15/21 0446  NA 152* 151* 146*  --   --  140  --  144 140  K 3.8 3.9 3.7  --   --  3.6  --  4.0 4.1  CL 117* 112* 109  --   --  102  --  108 105  CO2 --   --  28  --  26 25  GLUCOSE 149* 138* 127*  --   --  146*  --  154* 138*  BUN 33* 30* 26*  --   --  25*  --  33* 36*  CREATININE 0.88 0.83 0.75  --   --  0.68  --  0.68 0.58  CALCIUM 9.0 9.1 8.7*  --   --  8.8*  --  9.1 8.8*  MG 2.0  --   --  1.6* 1.7 1.8 1.8  --   --   PHOS  --   --   --  3.1 2.9 2.8 2.6  --   --    GFR: Estimated Creatinine Clearance: 49.3 mL/min (by C-G formula based on SCr of 0.58 mg/dL). Liver Function Tests: Recent Labs  Lab 09/09/21 0554 09/11/21 0544 09/12/21 0425 09/14/21 0620  AST ALT ALKPHOS 113 84 61 76  BILITOT 0.8 0.8 0.6 0.8  PROT 7.6 6.8 6.5 6.1*  ALBUMIN 3.2* 2.8* 2.8* 2.8*   No results for input(s): LIPASE, AMYLASE in the last 168 hours.  No results for input(s): AMMONIA in the last 168 hours. Coagulation Profile: Recent Labs  Lab 09/08/21 1259  INR 1.3*   Cardiac Enzymes: No results for input(s): CKTOTAL, CKMB, CKMBINDEX, TROPONINI in the last 168 hours. BNP (last 3 results) No results for input(s): PROBNP in the last 8760 hours. HbA1C: No results for input(s): HGBA1C in the last 72 hours. CBG: Recent Labs  Lab 09/14/21 1610 09/14/21 1952 09/15/21 0010 09/15/21 0347 09/15/21 0652  GLUCAP 139* 159* 114* 122* 123*   Lipid Profile: No results for input(s): CHOL, HDL, LDLCALC, TRIG, CHOLHDL, LDLDIRECT in the last 72 hours. Thyroid Function Tests: No results for input(s): TSH, T4TOTAL, FREET4, T3FREE, THYROIDAB in the last 72 hours. Anemia Panel: No results for input(s): VITAMINB12, FOLATE, FERRITIN, TIBC, IRON, RETICCTPCT in the last 72 hours. Sepsis Labs: Recent Labs  Lab 09/15/21 0446  PROCALCITON <0.10    Recent Results (from the past 240 hour(s))  Blood culture (single)     Status: None   Collection Time: 09/07/21 11:09 AM    Specimen: BLOOD LEFT ARM  Result Value Ref Range Status   Specimen Description BLOOD LEFT ARM  Final   Special Requests   Final    BOTTLES DRAWN AEROBIC AND ANAEROBIC Blood Culture adequate volume   Culture   Final    NO GROWTH 5 DAYS Performed at [facility], [address], [place], [place] [address]  Report Status 09/12/2021 FINAL  Final  Resp Panel by RT-PCR (Flu A&B, Covid) Nasopharyngeal Swab     Status: None   Collection Time: 09/07/21 11:09 AM   Specimen: Nasopharyngeal Swab; Nasopharyngeal(NP) swabs in vial transport medium  Result Value Ref Range Status   SARS Coronavirus 2 by RT PCR NEGATIVE NEGATIVE Final    Comment: (NOTE) SARS-CoV-2 target nucleic acids are NOT DETECTED.  The SARS-CoV-2 RNA is generally detectable in upper respiratory specimens during the acute phase of infection. The lowest concentration of SARS-CoV-2 viral copies this assay can detect is 138 copies/mL. A negative result does not preclude SARS-Cov-2 infection and should not be used as the sole basis for treatment or other patient management decisions. A negative result may occur with  improper specimen collection/handling, submission of specimen other than nasopharyngeal swab, presence of viral mutation(s) within the areas targeted by this assay, and inadequate number of viral copies(<138 copies/mL). A negative result must be combined with clinical observations, patient history, and epidemiological information. The expected result is Negative.  Fact Sheet for Patients:  BloggerCourse.com  Fact Sheet for Healthcare Providers:  SeriousBroker.it  This test is no t yet approved or cleared by the [place] FDA and  has been authorized for detection and/or diagnosis of SARS-CoV-2 by FDA under an Emergency Use Authorization (EUA). This EUA will remain  in effect (meaning this test can be used) for the duration of the COVID-19  declaration under Section 564(b)(1) of the Act, 21 U.S.C.section 360bbb-3(b)(1), unless the authorization is terminated  or revoked sooner.       Influenza A by PCR NEGATIVE NEGATIVE Final   Influenza B by PCR NEGATIVE NEGATIVE Final    Comment: (NOTE) The Xpert Xpress SARS-CoV-2/FLU/RSV plus assay is intended as an aid in the diagnosis of influenza from Nasopharyngeal swab specimens and should not be used as a sole basis for treatment. Nasal washings and aspirates are unacceptable for Xpert Xpress SARS-CoV-2/FLU/RSV testing.  Fact Sheet for Patients: BloggerCourse.com  Fact Sheet for Healthcare Providers: SeriousBroker.it  This test is not yet approved or cleared by the [place] FDA and has been authorized for detection and/or diagnosis of SARS-CoV-2 by FDA under an Emergency Use Authorization (EUA). This EUA will remain in effect (meaning this test can be used) for the duration of the COVID-19 declaration under Section 564(b)(1) of the Act, 21 U.S.C. section 360bbb-3(b)(1), unless the authorization is terminated or revoked.  Performed at [facility], [address], [place], [place] [address]   Urine Culture     Status: Abnormal   Collection Time: 09/07/21 11:23 AM   Specimen: Urine, Clean Catch  Result Value Ref Range Status   Specimen Description   Final    URINE, CLEAN CATCH Performed at [facility], [address], [place], [place] [address]    Special Requests   Final    NONE Performed at [facility], [address] [place], [place] [address]    Culture >=100,000 COLONIES/mL ESCHERICHIA COLI (A)  Final   Report Status 09/11/2021 FINAL  Final   Organism ID, Bacteria ESCHERICHIA COLI (A)  Final      Susceptibility   Escherichia coli - MIC*    AMPICILLIN >=32 RESISTANT Resistant     CEFAZOLIN <=4 SENSITIVE Sensitive     CEFEPIME <=0.12 SENSITIVE Sensitive     CEFTRIAXONE  <=0.25 SENSITIVE Sensitive     CIPROFLOXACIN <=0.25 SENSITIVE Sensitive     GENTAMICIN <=1 SENSITIVE Sensitive  IMIPENEM <=0.25 SENSITIVE Sensitive     NITROFURANTOIN <=16 SENSITIVE Sensitive     TRIMETH/SULFA <=20 SENSITIVE Sensitive     AMPICILLIN/SULBACTAM >=32 RESISTANT Resistant     PIP/TAZO <=4 SENSITIVE Sensitive     * >=100,000 COLONIES/mL ESCHERICHIA COLI  C Difficile Quick Screen w PCR reflex     Status: None   Collection Time: 09/07/21  7:21 PM   Specimen: STOOL  Result Value Ref Range Status   C Diff antigen NEGATIVE NEGATIVE Final   C Diff toxin NEGATIVE NEGATIVE Final   C Diff interpretation No C. difficile detected.  Final    Comment: Performed at [facility], [address] [place], [place] [address]  Gastrointestinal Panel by PCR , Stool     Status: None   Collection Time: 09/07/21  7:21 PM   Specimen: STOOL  Result Value Ref Range Status   Campylobacter species NOT DETECTED NOT DETECTED Final   Plesimonas shigelloides NOT DETECTED NOT DETECTED Final   Salmonella species NOT DETECTED NOT DETECTED Final   Yersinia enterocolitica NOT DETECTED NOT DETECTED Final   Vibrio species NOT DETECTED NOT DETECTED Final   Vibrio cholerae NOT DETECTED NOT DETECTED Final   Enteroaggregative E coli (EAEC) NOT DETECTED NOT DETECTED Final   Enteropathogenic E coli (EPEC) NOT DETECTED NOT DETECTED Final   Enterotoxigenic E coli (ETEC) NOT DETECTED NOT DETECTED Final   Shiga like toxin producing E coli (STEC) NOT DETECTED NOT DETECTED Final   Shigella/Enteroinvasive E coli (EIEC) NOT DETECTED NOT DETECTED Final   Cryptosporidium NOT DETECTED NOT DETECTED Final   Cyclospora cayetanensis NOT DETECTED NOT DETECTED Final   Entamoeba histolytica NOT DETECTED NOT DETECTED Final   Giardia lamblia NOT DETECTED NOT DETECTED Final   Adenovirus F40/41 NOT DETECTED NOT DETECTED Final   Astrovirus NOT DETECTED NOT DETECTED Final   Norovirus GI/GII NOT DETECTED NOT DETECTED Final    Rotavirus A NOT DETECTED NOT DETECTED Final   Sapovirus (I, II, IV, and V) NOT DETECTED NOT DETECTED Final    Comment: Performed at [facility], [address] [place], [place] [address]         Radiology Studies: DG Chest Port 1 View  Result Date: 09/14/2021 CLINICAL DATA:  Short of breath EXAM: PORTABLE CHEST 1 VIEW COMPARISON:  09/10/2021 FINDINGS: Heart size and vascularity normal. Negative for heart failure. Atherosclerotic calcification aorta Mild left lower lobe airspace disease likely atelectasis. This has developed in the interval. Minimal bilateral effusions. Right lung otherwise clear. NG tube enters the stomach with the tip not visualized. IMPRESSION: Interval development of mild left lower lobe atelectasis and minimal pleural effusion bilaterally. Negative for heart failure. Electronically Signed   By: Marlan Palauharles  Clark M.D.   On: 09/14/2021 10:06        Scheduled Meds:  apixaban  5 mg Oral BID   atorvastatin  40 mg Oral Daily   carvedilol  25 mg Oral BID WC   feeding supplement (PROSource TF)  45 mL Per Tube BID   free water  200 mL Per Tube Q4H   lisinopril  40 mg Oral Daily   methimazole  5 mg Oral Daily   pantoprazole (PROTONIX) IV  40 mg Intravenous Q24H   spironolactone  50 mg Oral Daily   Continuous Infusions:  feeding supplement (OSMOLITE 1.5 CAL) 1,000 mL (09/14/21 0850)     LOS: 8 days    Time spent: 25 mins     Charise KillianJamiese M Afreen Siebels, MD [facility] Pager  [phone]  If 7PM-7AM, please contact night-coverage 09/15/2021, 7:48 AM

## 2021-09-16 DIAGNOSIS — I4891 Unspecified atrial fibrillation: Secondary | ICD-10-CM | POA: Diagnosis not present

## 2021-09-16 DIAGNOSIS — G9341 Metabolic encephalopathy: Secondary | ICD-10-CM | POA: Diagnosis not present

## 2021-09-16 DIAGNOSIS — I639 Cerebral infarction, unspecified: Secondary | ICD-10-CM | POA: Diagnosis not present

## 2021-09-16 LAB — CBC
HCT: 47.9 % — ABNORMAL HIGH (ref 36.0–46.0)
Hemoglobin: 15.6 g/dL — ABNORMAL HIGH (ref 12.0–15.0)
MCH: 31.5 pg (ref 26.0–34.0)
MCHC: 32.6 g/dL (ref 30.0–36.0)
MCV: 96.6 fL (ref 80.0–100.0)
Platelets: 240 10*3/uL (ref 150–400)
RBC: 4.96 MIL/uL (ref 3.87–5.11)
RDW: 12.5 % (ref 11.5–15.5)
WBC: 25.9 10*3/uL — ABNORMAL HIGH (ref 4.0–10.5)
nRBC: 0 % (ref 0.0–0.2)

## 2021-09-16 LAB — BASIC METABOLIC PANEL
Anion gap: 7 (ref 5–15)
BUN: 36 mg/dL — ABNORMAL HIGH (ref 8–23)
CO2: 27 mmol/L (ref 22–32)
Calcium: 9 mg/dL (ref 8.9–10.3)
Chloride: 106 mmol/L (ref 98–111)
Creatinine, Ser: 0.65 mg/dL (ref 0.44–1.00)
GFR, Estimated: >60 mL/min (ref >60)
Glucose, Bld: 137 mg/dL — ABNORMAL HIGH (ref 70–99)
Potassium: 4.5 mmol/L (ref 3.5–5.1)
Sodium: 140 mmol/L (ref 135–145)

## 2021-09-16 LAB — GLUCOSE, CAPILLARY
Glucose-Capillary: 103 mg/dL — ABNORMAL HIGH (ref 70–99)
Glucose-Capillary: 110 mg/dL — ABNORMAL HIGH (ref 70–99)
Glucose-Capillary: 114 mg/dL — ABNORMAL HIGH (ref 70–99)
Glucose-Capillary: 124 mg/dL — ABNORMAL HIGH (ref 70–99)
Glucose-Capillary: 124 mg/dL — ABNORMAL HIGH (ref 70–99)
Glucose-Capillary: 137 mg/dL — ABNORMAL HIGH (ref 70–99)
Glucose-Capillary: 138 mg/dL — ABNORMAL HIGH (ref 70–99)

## 2021-09-16 LAB — HEPARIN LEVEL (UNFRACTIONATED): Heparin Unfractionated: >1.1 IU/mL — ABNORMAL HIGH (ref 0.30–0.70)

## 2021-09-16 LAB — APTT
aPTT: 28 seconds (ref 24–36)
aPTT: 55 seconds — ABNORMAL HIGH (ref 24–36)

## 2021-09-16 LAB — HIV ANTIBODY (ROUTINE TESTING W REFLEX): HIV Screen 4th Generation wRfx: NONREACTIVE

## 2021-09-16 MED ORDER — HEPARIN BOLUS VIA INFUSION
1500.0000 [IU] | Freq: Once | INTRAVENOUS | Status: AC
  Administered 2021-09-16: 1500 [IU] via INTRAVENOUS
  Filled 2021-09-16: qty 1500

## 2021-09-16 MED ORDER — HEPARIN (PORCINE) 25000 UT/250ML-% IV SOLN
900.0000 [IU]/h | INTRAVENOUS | Status: AC
  Administered 2021-09-16: 750 [IU]/h via INTRAVENOUS
  Administered 2021-09-17 – 2021-09-18 (×2): 900 [IU]/h via INTRAVENOUS
  Filled 2021-09-16 (×3): qty 250

## 2021-09-16 NOTE — Plan of Care (Signed)
  Problem: Health Behavior/Discharge Planning: Goal: Ability to manage health-related needs will improve Outcome: Progressing   Problem: Clinical Measurements: Goal: Ability to maintain clinical measurements within normal limits will improve Outcome: Progressing   Problem: Clinical Measurements: Goal: Will remain free from infection Outcome: Progressing   Problem: Clinical Measurements: Goal: Diagnostic test results will improve Outcome: Progressing   

## 2021-09-16 NOTE — Progress Notes (Signed)
Physical Therapy Treatment ?Patient Details ?Name: Joy Clark ?MRN: [id number] ?DOB: May 05, 1951 ?Today's Date: 09/16/2021 ? ? ?History of Present Illness Joy Clark is a 71 y.o. female with medical history significant for nicotine dependence, chronic pain syndrome, dyslipidemia who was brought into the ER by EMS for evaluation of mental status changes for about 4 days.  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 ? ?  ?PT Comments  ? ? Pt resting in bed upon therapy arrival (pt's eyes open and pt appearing alert).  Performed PT/OT co-treatment.  Pt occasionally nodding her head or briefly laughing or trying to verbalize (difficult to understand pt though) during session; increased time required for pt to process information during session.  Total assist x2 semi-supine to/from sitting edge of bed using bed sheet; sitting balance (x10 minutes) initially total assist x1 (d/t posterior lean) plus SBA of 2nd in front of pt for safety--improved to close SBA to CGA for <1 minute but then progressed back to total assist (for sitting balance) with pt fatigue.  Pt noted to spontaneously move L UE and L LE during session at times and also occasionally with cueing (minimal movement noted with R UE and R LE spontaneously or with cueing).  Pt repositioned in bed end of session for comfort.  Will continue to focus on strengthening, sitting balance, progressive functional mobility, and cognition during hospitalization. ?  ?Recommendations for follow up therapy are one component of a multi-disciplinary discharge planning process, led by the attending physician.  Recommendations may be updated based on patient status, additional functional criteria and insurance authorization. ? ?Follow Up Recommendations ? Skilled nursing-short term rehab (<3 hours/day) ?  ?  ?Assistance Recommended at Discharge Frequent or constant  Supervision/Assistance  ?Patient can return home with the following Two people to help with walking and/or transfers;Two people to help with bathing/dressing/bathroom;Direct supervision/assist for medications management;Assistance with feeding;Assistance with cooking/housework;Assist for transportation;Help with stairs or ramp for entrance;Direct supervision/assist for financial management ?  ?Equipment Recommendations ? BSC/3in1;Wheelchair (measurements PT);Wheelchair cushion (measurements PT);Hospital bed;Other (comment) (hoyer lift)  ?  ?Recommendations for Other Services   ? ? ?  ?Precautions / Restrictions Precautions ?Precautions: Fall ?Precaution Comments: Aspiration; feeding tube ?Restrictions ?Weight Bearing Restrictions: No  ?  ? ?Mobility ? Bed Mobility ?Overal bed mobility: Needs Assistance ?Bed Mobility: Supine to Sit, Sit to Supine ?  ?  ?Supine to sit: Total assist, +2 for physical assistance ?Sit to supine: Total assist, +2 for physical assistance ?  ?General bed mobility comments: assist for trunk and B LE's (use of bed sheet to perform) ?  ? ?Transfers ?  ?  ?  ?  ?  ?  ?  ?  ?  ?General transfer comment: not safe at this time ?  ? ?Ambulation/Gait ?  ?  ?  ?  ?  ?  ?  ?  ? ? ?Stairs ?  ?  ?  ?  ?  ? ? ?Wheelchair Mobility ?  ? ?Modified Rankin (Stroke Patients Only) ?  ? ? ?  ?Balance Overall balance assessment: Needs assistance ?Sitting-balance support: Feet supported, Bilateral upper extremity supported ?Sitting balance-[name]: Poor ?Sitting balance - Comments: varying between total assist to close SBA ?Postural control: Posterior lean ?  ?  ?  ?  ?  ?  ?  ?  ?  ?  ?  ?  ?  ?  ?  ? ?  ?  Cognition Arousal/Alertness: Awake/alert ?Behavior During Therapy: Flat affect (occasionally briefly laughing) ?Overall Cognitive Status: Impaired/Different from baseline ?Area of Impairment: Orientation, Following commands, Safety/judgement, Attention ?  ?  ?  ?  ?  ?  ?  ?  ?  ?Current Attention Level:  Selective ?  ?Following Commands: Follows one step commands inconsistently ?Safety/Judgement: Decreased awareness of safety, Decreased awareness of deficits ?  ?  ?General Comments: Follows 1 step commands <25% of time; increased time for processing ?  ?  ? ?  ?Exercises   ? ?  ?General Comments  Nursing cleared pt for participation in physical therapy.  MD Mayford Knife reporting no known therapy restrictions (regarding new stroke findings).  Pt agreeable to PT session (pt nodded her head yes). ? ?  ?  ? ?Pertinent Vitals/Pain Pain Assessment ?Pain Assessment: Faces ?Faces Pain Scale: No hurt ?Pain Intervention(s): Limited activity within patient's tolerance, Monitored during session, Repositioned  ? ? ?Home Living   ?  ?  ?  ?  ?  ?  ?  ?  ?  ?   ?  ?Prior Function    ?  ?  ?   ? ?PT Goals (current goals can now be found in the care plan section) Acute Rehab PT Goals ?Patient Stated Goal: "go home" ?PT Goal Formulation: Patient unable to participate in goal setting ?Time For Goal Achievement: 09/24/21 ?Potential to Achieve Goals: Fair ?Progress towards PT goals: Progressing toward goals ? ?  ?Frequency ? ? ? Min 2X/week ? ? ? ?  ?PT Plan Current plan remains appropriate  ? ? ?Co-evaluation PT/OT/SLP Co-Evaluation/Treatment: Yes ?Reason for Co-Treatment: Necessary to address cognition/behavior during functional activity;For patient/therapist safety;To address functional/ADL transfers ?PT goals addressed during session: Mobility/safety with mobility;Balance ?OT goals addressed during session: ADL's and self-care ?  ? ?  ?AM-PAC PT "6 Clicks" Mobility   ?Outcome Measure ? Help needed turning from your back to your side while in a flat bed without using bedrails?: A Lot ?Help needed moving from lying on your back to sitting on the side of a flat bed without using bedrails?: Total ?Help needed moving to and from a bed to a chair (including a wheelchair)?: Total ?Help needed standing up from a chair using your arms (e.g.,  wheelchair or bedside chair)?: Total ?Help needed to walk in hospital room?: Total ?Help needed climbing 3-5 steps with a railing? : Total ?6 Click Score: 7 ? ?  ?End of Session Equipment Utilized During Treatment: Oxygen ?Activity Tolerance: Patient limited by fatigue ?Patient left: in bed;with call bell/phone within reach;with bed alarm set;Other (comment) (staff member from lab present; B UE's elevated on pillows; B heels floating via pillow support) ?Nurse Communication: Mobility status;Precautions ?PT Visit Diagnosis: Muscle weakness (generalized) (M62.81);Other abnormalities of gait and mobility (R26.89);Unsteadiness on feet (R26.81) ?  ? ? ?Time: 2536-6440 ?PT Time Calculation (min) (ACUTE ONLY): 21 min ? ?Charges:  $Therapeutic Activity: 8-22 mins          ?          ?Hendricks Limes, PT ?09/16/21, 11:38 AM ? ? ?

## 2021-09-16 NOTE — Procedures (Signed)
Patient Name: Joy Clark  ?MRN: [id number]  ?Epilepsy Attending: Charlsie Quest  ?Referring Physician/Provider: Caryl Pina, MD ?Date: 09/16/2021 ?Duration: 23.21 mins ? ?Patient history:  71 year old female with AMS. 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 Exam reveals a severely encephalopathic patient who is nonverbal and not following any commands. EEG to evaluate for seizure ? ?Level of alertness: Awake ? ?AEDs during EEG study: None ? ?Technical aspects: This EEG study was done with scalp electrodes positioned according to the 10-20 International system of electrode placement. Electrical activity was acquired at a sampling rate of 500Hz  and reviewed with a high frequency filter of 70Hz  and a low frequency filter of 1Hz . EEG data were recorded continuously and digitally stored.  ? ?Description: No posterior dominant rhythm was seen. EEG showed continuous generalized 3 to 6 Hz theta-delta slowing. Generalized periodic discharges with triphasic morphology at  1 Hz were also noted intermittently.  ?Physiologic photic driving was not seen during photic stimulation.  Hyperventilation was not performed.    ? ?ABNORMALITY ?- Periodic discharges with triphasic morphology, generalized ( GPDs) ?- Continuous slow, generalized ? ?IMPRESSION: ?This study is suggestive of moderate to severe diffuse encephalopathy, nonspecific etiology but likely related to toxic-metabolic causes. No seizures or definite epileptiform discharges were seen throughout the recording. ? ?  ? ?

## 2021-09-16 NOTE — Consult Note (Signed)
[facility]  Total days of antibiotics 9 Reason for Consult:AMS    Referring Physician: williams  Principal Problem:   Acute metabolic encephalopathy Active Problems:   Enterocolitis   UTI (urinary tract infection)   Hypokalemia   Abnormal CT scan, gallbladder   Altered mental status   Colitis   Dehydration   Gallbladder hydrops   Hyperthyroidism   Nausea vomiting and diarrhea   RUQ pain   Atrial fibrillation with RVR (HCC)   Hypernatremia   Aspiration into airway   Malnutrition of moderate degree   Dysphagia    HPI: Joy Clark is a 71 y.o. female who has hx of smoking, fibromyalgia, dislipidemia, hx of cdiff and chronic pain syndrome was admitted on 3/1 for AMS x 4 days with chills and confusion, also new onset nausea and vomiting/Diarrhea. She was not following commands on admit (which at baseline could care for herself).her work up revealed hypokalemia, lactic acidosis, pyuria. NCHCT unrevealing. ABD CT showing infectious enterocolitis. She developed afib with rvr on first night of admission. Initially she thought to have uti due to pyuria and presumably her AMS was due to infection. She was treated with 7 day course of ceftriaxone. She also was seen by nephrology due to hyponatremia due to GI loss. Patient still not back to her baseline and required NG placement for medication administration. 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 Her mentation fails to improve. In addn, her wbc was stable up until 3/8 when it increased from 14 to 26K. She was started on CNS meningitis coverage due to concern for meningitis. Her LP not scheduled til Monday due to being on eliquis. Patient slightly improved today after initiation of vanco/ceftriaxone/ampicillin/acyclovir. Unclear why she didn't undergo LP sooner in  hospitalization. EEG today not revealing for definitive seizure  Past Medical History:  Diagnosis Date   Fibromyalgia     Allergies: Not on File   MEDICATIONS:  atorvastatin  40 mg Oral Daily   carvedilol  25 mg Oral BID WC   feeding supplement (PROSource TF)  45 mL Per Tube BID   free water  200 mL Per Tube Q4H   lisinopril  40 mg Oral Daily   methimazole  5 mg Oral Daily   pantoprazole (PROTONIX) IV  40 mg Intravenous Q24H   spironolactone  50 mg Oral Daily    Social History   Tobacco Use   Smoking status: Every Day    Types: Cigarettes    History reviewed. No pertinent family history.  Review of Systems - encephalopathy. Patient unable to reply   OBJECTIVE: Temp:  [97.8 F (36.6 C)-98.1 F (36.7 C)] 98 F (36.7 C) (03/10 1600) Pulse Rate:  [63-78] 65 (03/10 1600) Resp:  [19-22] 20 (03/10 1600) BP: (108-125)/(55-63) 125/58 (03/10 1600) SpO2:  [94 %-98 %] 95 % (03/10 1600) Weight:  [49.2 kg] 49.2 kg (03/10 0549) Physical Exam  Constitutional:  oriented to self. appears well-developed and well-nourished. No distress.  HENT: [place]/AT, PERRLA, no scleral icterus Mouth/Throat: Oropharynx is clear and moist. + oropharyngeal exudate.  Cardiovascular: Normal rate, regular rhythm and normal heart sounds. Exam reveals no gallop and no friction rub.  No murmur heard.  Pulmonary/Chest: Effort normal and breath sounds normal. No respiratory distress.  has no wheezes.  Neck = supple, + nuchal rigidity Abdominal: Soft. Bowel  sounds are normal.  exhibits no distension. There is no tenderness.  Lymphadenopathy: no cervical adenopathy. No axillary adenopathy Neurological: alert and oriented to person, only. Does not follow commands Skin: Skin is warm and dry. No rash noted. No erythema.  Psychiatric: a normal mood and affect.  behavior is normal.   LABS: Results for orders placed or performed during the hospital encounter of 09/07/21 (from the past 48 hour(s))  Glucose,  capillary     Status: Abnormal   Collection Time: 09/14/21  7:52 PM  Result Value Ref Range   Glucose-Capillary 159 (H) 70 - 99 mg/dL    Comment: Glucose reference range applies only to samples taken after fasting for at least 8 hours.  Glucose, capillary     Status: Abnormal   Collection Time: 09/15/21 12:10 AM  Result Value Ref Range   Glucose-Capillary 114 (H) 70 - 99 mg/dL    Comment: Glucose reference range applies only to samples taken after fasting for at least 8 hours.  Glucose, capillary     Status: Abnormal   Collection Time: 09/15/21  3:47 AM  Result Value Ref Range   Glucose-Capillary 122 (H) 70 - 99 mg/dL    Comment: Glucose reference range applies only to samples taken after fasting for at least 8 hours.  CBC     Status: Abnormal   Collection Time: 09/15/21  4:46 AM  Result Value Ref Range   WBC 26.6 (H) 4.0 - 10.5 K/uL   RBC 5.08 3.87 - 5.11 MIL/uL   Hemoglobin 16.0 (H) 12.0 - 15.0 g/dL   HCT 16.1 (H) 09.6 - 04.5 %   MCV 94.9 80.0 - 100.0 fL   MCH 31.5 26.0 - 34.0 pg   MCHC 33.2 30.0 - 36.0 g/dL   RDW 40.9 81.1 - 91.4 %   Platelets 218 150 - 400 K/uL   nRBC 0.0 0.0 - 0.2 %    Comment: Performed at [facility], [address], [place], [place] [address]  Basic metabolic panel     Status: Abnormal   Collection Time: 09/15/21  4:46 AM  Result Value Ref Range   Sodium 140 135 - 145 mmol/L   Potassium 4.1 3.5 - 5.1 mmol/L   Chloride 105 98 - 111 mmol/L   CO2 25 22 - 32 mmol/L   Glucose, Bld 138 (H) 70 - 99 mg/dL    Comment: Glucose reference range applies only to samples taken after fasting for at least 8 hours.   BUN 36 (H) 8 - 23 mg/dL   Creatinine, Ser 6.21 0.44 - 1.00 mg/dL   Calcium 8.8 (L) 8.9 - 10.3 mg/dL   GFR, Estimated >30 >86 mL/min    Comment: (NOTE) Calculated using the CKD-EPI Creatinine Equation (2021)    Anion gap 10 5 - 15    Comment: Performed at [facility], [address] [place], [place] [address]  Procalcitonin -  Baseline     Status: None   Collection Time: 09/15/21  4:46 AM  Result Value Ref Range   Procalcitonin <0.10 ng/mL    Comment:        Interpretation: PCT (Procalcitonin) <= 0.5 ng/mL: Systemic infection (sepsis) is not likely. Local bacterial infection is possible. (NOTE)       Sepsis PCT Algorithm           Lower Respiratory Tract  Infection PCT Algorithm    ----------------------------     ----------------------------         PCT < 0.25 ng/mL                PCT < 0.10 ng/mL          Strongly encourage             Strongly discourage   discontinuation of antibiotics    initiation of antibiotics    ----------------------------     -----------------------------       PCT 0.25 - 0.50 ng/mL            PCT 0.10 - 0.25 ng/mL               OR       >80% decrease in PCT            Discourage initiation of                                            antibiotics      Encourage discontinuation           of antibiotics    ----------------------------     -----------------------------         PCT >= 0.50 ng/mL              PCT 0.26 - 0.50 ng/mL               AND        <80% decrease in PCT             Encourage initiation of                                             antibiotics       Encourage continuation           of antibiotics    ----------------------------     -----------------------------        PCT >= 0.50 ng/mL                  PCT > 0.50 ng/mL               AND         increase in PCT                  Strongly encourage                                      initiation of antibiotics    Strongly encourage escalation           of antibiotics                                     -----------------------------                                           PCT <= 0.25 ng/mL  OR                                        > 80% decrease in PCT                                      Discontinue / Do not initiate                                              antibiotics  Performed at [facility], [address] [place], [place] [address]   Glucose, capillary     Status: Abnormal   Collection Time: 09/15/21  6:52 AM  Result Value Ref Range   Glucose-Capillary 123 (H) 70 - 99 mg/dL    Comment: Glucose reference range applies only to samples taken after fasting for at least 8 hours.  Glucose, capillary     Status: Abnormal   Collection Time: 09/15/21  8:45 AM  Result Value Ref Range   Glucose-Capillary 152 (H) 70 - 99 mg/dL    Comment: Glucose reference range applies only to samples taken after fasting for at least 8 hours.  Glucose, capillary     Status: Abnormal   Collection Time: 09/15/21 12:10 PM  Result Value Ref Range   Glucose-Capillary 140 (H) 70 - 99 mg/dL    Comment: Glucose reference range applies only to samples taken after fasting for at least 8 hours.  Glucose, capillary     Status: Abnormal   Collection Time: 09/15/21  4:53 PM  Result Value Ref Range   Glucose-Capillary 110 (H) 70 - 99 mg/dL    Comment: Glucose reference range applies only to samples taken after fasting for at least 8 hours.  Glucose, capillary     Status: Abnormal   Collection Time: 09/15/21  8:44 PM  Result Value Ref Range   Glucose-Capillary 138 (H) 70 - 99 mg/dL    Comment: Glucose reference range applies only to samples taken after fasting for at least 8 hours.  Glucose, capillary     Status: Abnormal   Collection Time: 09/16/21 12:15 AM  Result Value Ref Range   Glucose-Capillary 124 (H) 70 - 99 mg/dL    Comment: Glucose reference range applies only to samples taken after fasting for at least 8 hours.  Glucose, capillary     Status: Abnormal   Collection Time: 09/16/21  4:14 AM  Result Value Ref Range   Glucose-Capillary 110 (H) 70 - 99 mg/dL    Comment: Glucose reference range applies only to samples taken after fasting for at least 8 hours.  CBC     Status: Abnormal   Collection Time:  09/16/21  4:31 AM  Result Value Ref Range   WBC 25.9 (H) 4.0 - 10.5 K/uL   RBC 4.96 3.87 - 5.11 MIL/uL   Hemoglobin 15.6 (H) 12.0 - 15.0 g/dL   HCT 61.4 (H) 43.1 - 54.0 %   MCV 96.6 80.0 - 100.0 fL   MCH 31.5 26.0 - 34.0 pg   MCHC 32.6 30.0 - 36.0 g/dL   RDW 08.6 76.1 - 95.0 %   Platelets 240 150 - 400 K/uL   nRBC 0.0 0.0 - 0.2 %  Comment: Performed at [facility], [address] [place], [place] [address]  Basic metabolic panel     Status: Abnormal   Collection Time: 09/16/21  4:31 AM  Result Value Ref Range   Sodium 140 135 - 145 mmol/L   Potassium 4.5 3.5 - 5.1 mmol/L   Chloride 106 98 - 111 mmol/L   CO2 27 22 - 32 mmol/L   Glucose, Bld 137 (H) 70 - 99 mg/dL    Comment: Glucose reference range applies only to samples taken after fasting for at least 8 hours.   BUN 36 (H) 8 - 23 mg/dL   Creatinine, Ser 5.39 0.44 - 1.00 mg/dL   Calcium 9.0 8.9 - 76.7 mg/dL   GFR, Estimated >34 >19 mL/min    Comment: (NOTE) Calculated using the CKD-EPI Creatinine Equation (2021)    Anion gap 7 5 - 15    Comment: Performed at [facility], [address] [place], [place] [address]  HIV Antibody (routine testing w rflx)     Status: None   Collection Time: 09/16/21  4:43 AM  Result Value Ref Range   HIV Screen 4th Generation wRfx Non Reactive Non Reactive    Comment: Performed at [facility], [address], [place], [place] [address]  Glucose, capillary     Status: Abnormal   Collection Time: 09/16/21  7:28 AM  Result Value Ref Range   Glucose-Capillary 137 (H) 70 - 99 mg/dL    Comment: Glucose reference range applies only to samples taken after fasting for at least 8 hours.  APTT     Status: None   Collection Time: 09/16/21  9:22 AM  Result Value Ref Range   aPTT 28 24 - 36 seconds    Comment: Performed at [facility], [address] [place], [place] [address]  Heparin level (unfractionated)     Status: Abnormal   Collection Time: 09/16/21   9:22 AM  Result Value Ref Range   Heparin Unfractionated >1.10 (H) 0.30 - 0.70 IU/mL    Comment: (NOTE) The clinical reportable range upper limit is being lowered to >1.10 to align with the FDA approved guidance for the current laboratory assay.  If heparin results are below expected values, and patient dosage has  been confirmed, suggest follow up testing of antithrombin III levels. Performed at [facility], [address] [place], [place] [address]   Glucose, capillary     Status: Abnormal   Collection Time: 09/16/21 12:00 PM  Result Value Ref Range   Glucose-Capillary 138 (H) 70 - 99 mg/dL    Comment: Glucose reference range applies only to samples taken after fasting for at least 8 hours.  Glucose, capillary     Status: Abnormal   Collection Time: 09/16/21  4:19 PM  Result Value Ref Range   Glucose-Capillary 124 (H) 70 - 99 mg/dL    Comment: Glucose reference range applies only to samples taken after fasting for at least 8 hours.    MICRO: reviewed IMAGING: MR BRAIN WO CONTRAST  Result Date: 09/15/2021 CLINICAL DATA:  Delirium EXAM: MRI HEAD WITHOUT CONTRAST TECHNIQUE: Multiplanar, multiecho pulse sequences of the brain and surrounding structures were obtained without intravenous contrast. COMPARISON:  None. FINDINGS: Motion artifact is present. Brain: Small focus of mildly reduced diffusion in the left thalamocapsular region. A punctate focus of diffusion hyperintensity and ADC isointensity is present in the right thalamus. No evidence of intracranial hemorrhage. There is no intracranial mass, mass effect, or  edema. There is no hydrocephalus or extra-axial fluid collection. Prominence of the ventricles and sulci reflects parenchymal volume loss. Patchy T2 hyperintensity in the supratentorial white matter is nonspecific but may reflect mild to moderate chronic microvascular ischemic changes. There is a small chronic infarct of the right frontal white matter. Vascular:  Major vessel flow voids at the skull base are preserved. Skull and upper cervical spine: Small lesion of the right parietal calvarium along the inner table is probably benign. Sinuses/Orbits: Paranasal sinuses are aerated. Orbits are unremarkable. Other: Sella is unremarkable.  Mastoid air cells are clear. 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 Additional punctate more subacute appearing right thalamic infarct. Mild to moderate chronic microvascular ischemic changes. 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 Electronically Signed   By: Guadlupe Spanish M.D.   On: 09/15/2021 14:26   MR BRAIN W CONTRAST  Result Date: 09/16/2021 CLINICAL DATA:  Initial evaluation for intracranial infection. EXAM: MRI HEAD WITH CONTRAST TECHNIQUE: Multiplanar, multiecho pulse sequences of the brain and surrounding structures were obtained with intravenous contrast. CONTRAST:  4mL GADAVIST GADOBUTROL 1 MMOL/ML IV SOLN COMPARISON:  Prior noncontrast brain MRI from earlier the same day. FINDINGS: Brain: Examination moderately degraded by motion artifact. Precontrast T1 weighted sequence demonstrates no definite new intracranial abnormality since previous MRI from earlier the same day. Following contrast administration, no pathologic enhancement is seen. No visible mass lesion, mass effect or midline shift. No hydrocephalus or extra-axial fluid collection. No other new intracranial abnormality. Vascular: Major intracranial vascular flow voids grossly maintained. Skull and upper cervical spine: Craniocervical junction within normal limits. Bone marrow signal intensity normal. No scalp soft tissue abnormality. Sinuses/Orbits: Globes and orbital soft tissues demonstrate no acute finding. Paranasal sinuses and mastoid air cells are grossly clear. Other: None. IMPRESSION: No pathologic enhancement or MR evidence for acute intracranial infection. Electronically Signed   By: Rise Mu M.D.   On:  09/16/2021 02:21   EEG adult  Result Date: 09/16/2021 Charlsie Quest, MD     09/16/2021  4:42 PM Patient Name: Marabelle Cushman MRN: [id number] Epilepsy Attending: Charlsie Quest Referring Physician/Provider: Caryl Pina, MD Date: 09/16/2021 Duration: 23.21 mins Patient history:  71 year old female with AMS. 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 Exam reveals a severely encephalopathic patient who is nonverbal and not following any commands. EEG to evaluate for seizure Level of alertness: Awake AEDs during EEG study: None Technical aspects: This EEG study was done with scalp electrodes positioned according to the 10-20 International system of electrode placement. Electrical activity was acquired at a sampling rate of  and reviewed with a high frequency filter of  and a low frequency filter of . EEG data were recorded continuously and digitally stored. Description: No posterior dominant rhythm was seen. EEG showed continuous generalized 3 to 6 Hz theta-delta slowing. Generalized periodic discharges with triphasic morphology at  1 Hz were also noted intermittently. Physiologic photic driving was not seen during photic stimulation.  Hyperventilation was not performed.   ABNORMALITY - Periodic discharges with triphasic morphology, generalized ( GPDs) - Continuous slow, generalized IMPRESSION: This study is suggestive of moderate to severe diffuse encephalopathy, nonspecific etiology but likely related to toxic-metabolic causes. No seizures or definite epileptiform discharges were seen throughout the recording. Charlsie Quest     Assessment/Plan:  71yo F with multiple co-morbidities to address on admit. Having encephalopathy  with nausea and vomiting/Diarrhea concern for infectious enterocolitis, treated with 7 days of IV abtx ( ? UTI), afib, hyponatremia/kalemia from gi loss, afib with acute small  infarct to right thalamus, but overall still remains encephalopathic. Could be due to HSV encephalitis, or aseptic/viral meningitis (such as adenovirus- that could also cause gi symptoms), - recommend to discontinue vancomycin(usually added for R strep pneumococcus) - can continue ampicillin/ceftriaxone/ acyclovir - get LP to see if can de-escalate abtx - continue to monitor to see if improvement  Dr Daiva EvesVan Dam available on Monday to see patient/ Dr Luciana Axeomer available over the weekend.

## 2021-09-16 NOTE — Consult Note (Signed)
Pharmacy Antibiotic Note ? ?Joy Clark is a 71 y.o. female admitted on 09/07/2021 with meningitis.  Pharmacy has been consulted for vancomycin dosing. Neuro is concern for meningitis/encephalitis.  ? ?Received LD vancomycin 1250 mg x 1. Scr < 0.8.  ? ?Plan: ?Continue vancomycin 1000 mg q24H based on Randall vancomycin nomogram.  ?Dosing by trough due to c/f meningitis. ?Plan to obtain tough level prior to the 4th or 5th dose. Tough goal 15-20.  ?Continue ceftriaxone 2 g q12H  ?Continue acyclovir 10 mg/kg q12H based on renal function  ?Continue ampicillin 2 g q6H based on renal function.  ? ?Monitor renal function, clinical course. F/u LP plans (recent apixaban dose, transitioning to heparin). ? ?Height: 5\' 10"  (177.8 cm) ?Weight: 49.2 kg (108 lb 7.5 oz) ?IBW/kg (Calculated) : 68.5 ? ?Temp (24hrs), Avg:97.7 ?F (36.5 ?C), Min:97.3 ?F (36.3 ?C), Max:98.1 ?F (36.7 ?C) ? ?Recent Labs  ?Lab 09/12/21 ?0425 09/13/21 ?11/13/21 09/14/21 ?11/14/21 09/15/21 ?11/15/21 09/16/21 ?0431  ?WBC 17.4* 19.5* 22.8* 26.6* 25.9*  ?CREATININE 0.75 0.68 0.68 0.58 0.65  ? ?  ?Estimated Creatinine Clearance: 50.1 mL/min (by C-G formula based on SCr of 0.65 mg/dL).   ? ?Not on File ? ?Antimicrobials this admission: ?3/9 vancomycin >>  ?3/9 ceftriaxone >>  ?3/9 ampicillin >>  ?3/9 acyclovir >>  ? ?Dose adjustments this admission: ?None ? ?Microbiology results: ?3/9 UCx: pending  ? ?Thank you for allowing pharmacy to be a part of this patient?s care. ? ?5/9, PharmD ?Pharmacy Resident  ?09/16/2021 ?9:35 AM ?  ? ?

## 2021-09-16 NOTE — Progress Notes (Signed)
Eeg done 

## 2021-09-16 NOTE — Progress Notes (Addendum)
PROGRESS NOTE    Joy Clark  ZOX:[id number] DOB: 1950-07-19 DOA: 09/07/2021 PCP: Pcp, No    Assessment & Plan:   Principal Problem:   Acute metabolic encephalopathy Active Problems:   Enterocolitis   UTI (urinary tract infection)   Hypokalemia   Abnormal CT scan, gallbladder   Altered mental status   Colitis   Dehydration   Gallbladder hydrops   Hyperthyroidism   Nausea vomiting and diarrhea   RUQ pain   Atrial fibrillation with RVR (HCC)   Hypernatremia   Aspiration into airway   Malnutrition of moderate degree   Dysphagia  Acute CVA: MRI shows small acute infarct in the left thalamocapsular region & punctate more subacute appearing right thalamic infarct , small chronic right frontal white matter infarct. Continue neuro checks. D/c eliquis and start IV heparin to prefer for LP on 09/19/21 as per neuro. EEG ordered. Neuro following and recs apprec   Acute metabolic encephalopathy: no hx of dementia as per pt's son. Likely secondary to CVA. Unchanged from day prior. Continue w/ supportive care. Repeat CXR shows atelectasis & neg for heart heart. Encourage incentive spirometry.    Dysphagia: continue w/ tube feeds but will hold tube feeds starting 09/18/21 at 11PM to prepare for LP on 09/19/21   A. fib: w/ RVR. New onset. Back in sinus rhythm. Continue on coreg & IV heparin    Low TSH:  signs of hyperthyroid. Possible Grave's disease. Thyroid US did not show suspicious nodules. T4 elevated, T3 normal. Continue on coreg, methimazole. Will need to f/u outpatient w/ endocrinology   HTN: uncontrolled. Continue on lisinopril, coreg, aldactone    Potential for opioid withdrawal: family endorses that patient takes large amount of percocet scheduled for LE pain. Percocet prn    Gallbladder disease: no urgent surgical intervention as per gen surg.   Leukocytosis: completed abx course. Labile. Procal <0.10. ID consulted    Hypokalemia: WNL today   Hypernatremia: resolved    UTI:  completed abx course    Enterocolitis: likely viral vs inflammatory. GI PCR panel, c. diff both neg   Likely moderate protein calorie malnutriton: continue w/ tube feeds but will hold tube feeds starting 09/18/21 at 11PM to prepare for LP on 09/19/21   DVT prophylaxis: eliquis  Code Status:  full  Family Communication: discussed pt's care w/ pt's son, Sharia Reeve, and answered his questions. Called pt's daughter, Fleet Contras, but no answer so I left a voicemail  Disposition Plan:  possibly d/c to SNF   Level of care: Progressive  Status is: Inpatient Remains inpatient appropriate because:  EEG & LP ordered. Still AMS.    Consultants:  Nephro   Procedures:   Antimicrobials:   Subjective: Pt will make eye contact but not answer any questions   Objective: Vitals:   09/15/21 2045 09/16/21 0016 09/16/21 0549 09/16/21 0728  BP: 108/63 116/62  110/63  Pulse: 69 63  78  Resp: (!) 22 (!) 21  20  Temp: 98.1 F (36.7 C) 97.9 F (36.6 C)  97.8 F (36.6 C)  TempSrc: Oral Oral  Axillary  SpO2: 94% 98%  96%  Weight:   49.2 kg   Height:        Intake/Output Summary (Last 24 hours) at 09/16/2021 0731 Last data filed at 09/16/2021 0311 Gross per 24 hour  Intake 1550 ml  Output --  Net 1550 ml   Filed Weights   09/14/21 0346 09/15/21 0348 09/16/21 0549  Weight: 48.5 kg 48.4 kg 49.2 kg  Examination:  General exam:Appears calm but uncomfortable. Frail appearing  Respiratory system: diminished breath sounds b/l Cardiovascular system: S1/S2+. No rubs or clicks  Gastrointestinal system: Abd is soft, NT, ND & hypoactive bowel sounds Central nervous system: Alert and awake. Makes eye contact. Does not follow simple commands  Psychiatry: judgement and insight appears poor. Flat mood and affect    Data Reviewed: I have personally reviewed following labs and imaging studies  CBC: Recent Labs  Lab 09/12/21 0425 09/13/21 0619 09/14/21 0620 09/15/21 0446 09/16/21 0431  WBC 17.4* 19.5*  22.8* 26.6* 25.9*  HGB 16.4* 17.3* 16.9* 16.0* 15.6*  HCT 49.6* 50.7* 49.7* 48.2* 47.9*  MCV 94.7 92.2 92.7 94.9 96.6  PLT 206 195 212 218 240   Basic Metabolic Panel: Recent Labs  Lab 09/10/21 0846 09/11/21 0544 09/12/21 0425 09/12/21 1304 09/12/21 1715 09/13/21 0619 09/13/21 1713 09/14/21 0620 09/15/21 0446 09/16/21 0431  NA 152*   < > 146*  --   --  140  --  144 140 140  K 3.8   < > 3.7  --   --  3.6  --  4.0 4.1 4.5  CL 117*   < > 109  --   --  102  --  108 105 106  CO2 26   < > 28  --   --  28  --  26 25 27   GLUCOSE 149*   < > 127*  --   --  146*  --  154* 138* 137*  BUN 33*   < > 26*  --   --  25*  --  33* 36* 36*  CREATININE 0.88   < > 0.75  --   --  0.68  --  0.68 0.58 0.65  CALCIUM 9.0   < > 8.7*  --   --  8.8*  --  9.1 8.8* 9.0  MG 2.0  --   --  1.6* 1.7 1.8 1.8  --   --   --   PHOS  --   --   --  3.1 2.9 2.8 2.6  --   --   --    < > = values in this interval not displayed.   GFR: Estimated Creatinine Clearance: 50.1 mL/min (by C-G formula based on SCr of 0.65 mg/dL). Liver Function Tests: Recent Labs  Lab 09/11/21 0544 09/12/21 0425 09/14/21 0620  AST 19 16 22   ALT 12 13 20   ALKPHOS 84 61 76  BILITOT 0.8 0.6 0.8  PROT 6.8 6.5 6.1*  ALBUMIN 2.8* 2.8* 2.8*   No results for input(s): LIPASE, AMYLASE in the last 168 hours.  No results for input(s): AMMONIA in the last 168 hours. Coagulation Profile: No results for input(s): INR, PROTIME in the last 168 hours.  Cardiac Enzymes: No results for input(s): CKTOTAL, CKMB, CKMBINDEX, TROPONINI in the last 168 hours. BNP (last 3 results) No results for input(s): PROBNP in the last 8760 hours. HbA1C: No results for input(s): HGBA1C in the last 72 hours. CBG: Recent Labs  Lab 09/15/21 1653 09/15/21 2044 09/16/21 0015 09/16/21 0414 09/16/21 0728  GLUCAP 110* 138* 124* 110* 137*   Lipid Profile: No results for input(s): CHOL, HDL, LDLCALC, TRIG, CHOLHDL, LDLDIRECT in the last 72 hours. Thyroid Function  Tests: No results for input(s): TSH, T4TOTAL, FREET4, T3FREE, THYROIDAB in the last 72 hours. Anemia Panel: No results for input(s): VITAMINB12, FOLATE, FERRITIN, TIBC, IRON, RETICCTPCT in the last 72 hours. Sepsis Labs: Recent Labs  Lab  09/15/21 0446  PROCALCITON <0.10    Recent Results (from the past 240 hour(s))  Blood culture (single)     Status: None   Collection Time: 09/07/21 11:09 AM   Specimen: BLOOD LEFT ARM  Result Value Ref Range Status   Specimen Description BLOOD LEFT ARM  Final   Special Requests   Final    BOTTLES DRAWN AEROBIC AND ANAEROBIC Blood Culture adequate volume   Culture   Final    NO GROWTH 5 DAYS Performed at [facility], [address] [place], [place] [address]    Report Status 09/12/2021 FINAL  Final  Resp Panel by RT-PCR (Flu A&B, Covid) Nasopharyngeal Swab     Status: None   Collection Time: 09/07/21 11:09 AM   Specimen: Nasopharyngeal Swab; Nasopharyngeal(NP) swabs in vial transport medium  Result Value Ref Range Status   SARS Coronavirus 2 by RT PCR NEGATIVE NEGATIVE Final    Comment: (NOTE) SARS-CoV-2 target nucleic acids are NOT DETECTED.  The SARS-CoV-2 RNA is generally detectable in upper respiratory specimens during the acute phase of infection. The lowest concentration of SARS-CoV-2 viral copies this assay can detect is 138 copies/mL. A negative result does not preclude SARS-Cov-2 infection and should not be used as the sole basis for treatment or other patient management decisions. A negative result may occur with  improper specimen collection/handling, submission of specimen other than nasopharyngeal swab, presence of viral mutation(s) within the areas targeted by this assay, and inadequate number of viral copies(<138 copies/mL). A negative result must be combined with clinical observations, patient history, and epidemiological information. The expected result is Negative.  Fact Sheet for Patients:   BloggerCourse.com  Fact Sheet for Healthcare Providers:  SeriousBroker.it  This test is no t yet approved or cleared by the [place] FDA and  has been authorized for detection and/or diagnosis of SARS-CoV-2 by FDA under an Emergency Use Authorization (EUA). This EUA will remain  in effect (meaning this test can be used) for the duration of the COVID-19 declaration under Section 564(b)(1) of the Act, 21 U.S.C.section 360bbb-3(b)(1), unless the authorization is terminated  or revoked sooner.       Influenza A by PCR NEGATIVE NEGATIVE Final   Influenza B by PCR NEGATIVE NEGATIVE Final    Comment: (NOTE) The Xpert Xpress SARS-CoV-2/FLU/RSV plus assay is intended as an aid in the diagnosis of influenza from Nasopharyngeal swab specimens and should not be used as a sole basis for treatment. Nasal washings and aspirates are unacceptable for Xpert Xpress SARS-CoV-2/FLU/RSV testing.  Fact Sheet for Patients: BloggerCourse.com  Fact Sheet for Healthcare Providers: SeriousBroker.it  This test is not yet approved or cleared by the [place] FDA and has been authorized for detection and/or diagnosis of SARS-CoV-2 by FDA under an Emergency Use Authorization (EUA). This EUA will remain in effect (meaning this test can be used) for the duration of the COVID-19 declaration under Section 564(b)(1) of the Act, 21 U.S.C. section 360bbb-3(b)(1), unless the authorization is terminated or revoked.  Performed at [facility], [address] [place], [place] [address]   Urine Culture     Status: Abnormal   Collection Time: 09/07/21 11:23 AM   Specimen: Urine, Clean Catch  Result Value Ref Range Status   Specimen Description   Final    URINE, CLEAN CATCH Performed at [facility], [address] [place], [place] [address]    Special Requests   Final     NONE Performed at [facility], [address] [place], [place] [address]    Culture >=100,000 COLONIES/mL ESCHERICHIA COLI (A)  Final   Report Status 09/11/2021 FINAL  Final   Organism ID, Bacteria ESCHERICHIA COLI (A)  Final      Susceptibility   Escherichia coli - MIC*    AMPICILLIN >=32 RESISTANT Resistant     CEFAZOLIN <=4 SENSITIVE Sensitive     CEFEPIME <=0.12 SENSITIVE Sensitive     CEFTRIAXONE <=0.25 SENSITIVE Sensitive     CIPROFLOXACIN <=0.25 SENSITIVE Sensitive     GENTAMICIN <=1 SENSITIVE Sensitive     IMIPENEM <=0.25 SENSITIVE Sensitive     NITROFURANTOIN <=16 SENSITIVE Sensitive     TRIMETH/SULFA <=20 SENSITIVE Sensitive     AMPICILLIN/SULBACTAM >=32 RESISTANT Resistant     PIP/TAZO <=4 SENSITIVE Sensitive     * >=100,000 COLONIES/mL ESCHERICHIA COLI  C Difficile Quick Screen w PCR reflex     Status: None   Collection Time: 09/07/21  7:21 PM   Specimen: STOOL  Result Value Ref Range Status   C Diff antigen NEGATIVE NEGATIVE Final   C Diff toxin NEGATIVE NEGATIVE Final   C Diff interpretation No C. difficile detected.  Final    Comment: Performed at [facility], [address] [place], [place] [address]  Gastrointestinal Panel by PCR , Stool     Status: None   Collection Time: 09/07/21  7:21 PM   Specimen: STOOL  Result Value Ref Range Status   Campylobacter species NOT DETECTED NOT DETECTED Final   Plesimonas shigelloides NOT DETECTED NOT DETECTED Final   Salmonella species NOT DETECTED NOT DETECTED Final   Yersinia enterocolitica NOT DETECTED NOT DETECTED Final   Vibrio species NOT DETECTED NOT DETECTED Final   Vibrio cholerae NOT DETECTED NOT DETECTED Final   Enteroaggregative E coli (EAEC) NOT DETECTED NOT DETECTED Final   Enteropathogenic E coli (EPEC) NOT DETECTED NOT DETECTED Final   Enterotoxigenic E coli (ETEC) NOT DETECTED NOT DETECTED Final   Shiga like toxin producing E coli (STEC) NOT DETECTED NOT DETECTED Final    Shigella/Enteroinvasive E coli (EIEC) NOT DETECTED NOT DETECTED Final   Cryptosporidium NOT DETECTED NOT DETECTED Final   Cyclospora cayetanensis NOT DETECTED NOT DETECTED Final   Entamoeba histolytica NOT DETECTED NOT DETECTED Final   Giardia lamblia NOT DETECTED NOT DETECTED Final   Adenovirus F40/41 NOT DETECTED NOT DETECTED Final   Astrovirus NOT DETECTED NOT DETECTED Final   Norovirus GI/GII NOT DETECTED NOT DETECTED Final   Rotavirus A NOT DETECTED NOT DETECTED Final   Sapovirus (I, II, IV, and V) NOT DETECTED NOT DETECTED Final    Comment: Performed at [facility], [address], [place], [place] [address]         Radiology Studies: MR BRAIN WO CONTRAST  Result Date: 09/15/2021 CLINICAL DATA:  Delirium EXAM: MRI HEAD WITHOUT CONTRAST TECHNIQUE: Multiplanar, multiecho pulse sequences of the brain and surrounding structures were obtained without intravenous contrast. COMPARISON:  None. FINDINGS: Motion artifact is present. Brain: Small focus of mildly reduced diffusion in the left thalamocapsular region. A punctate focus of diffusion hyperintensity and ADC isointensity is present in the right thalamus. No evidence of intracranial hemorrhage. There is no intracranial mass, mass effect, or edema. There is no hydrocephalus or extra-axial fluid collection. Prominence of the ventricles and sulci reflects parenchymal volume loss. Patchy T2 hyperintensity in the supratentorial white matter is nonspecific but may reflect mild to moderate chronic microvascular ischemic changes. There is a small chronic infarct of  the right frontal white matter. Vascular: Major vessel flow voids at the skull base are preserved. Skull and upper cervical spine: Small lesion of the right parietal calvarium along the inner table is probably benign. Sinuses/Orbits: Paranasal sinuses are aerated. Orbits are unremarkable. Other: Sella is unremarkable.  Mastoid air cells are clear. IMPRESSION: Small acute infarct  in the left thalamocapsular region. Additional punctate more subacute appearing right thalamic infarct. Mild to moderate chronic microvascular ischemic changes. 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 Electronically Signed   By: Guadlupe Spanish M.D.   On: 09/15/2021 14:26   MR BRAIN W CONTRAST  Result Date: 09/16/2021 CLINICAL DATA:  Initial evaluation for intracranial infection. EXAM: MRI HEAD WITH CONTRAST TECHNIQUE: Multiplanar, multiecho pulse sequences of the brain and surrounding structures were obtained with intravenous contrast. CONTRAST:  69mL GADAVIST GADOBUTROL 1 MMOL/ML IV SOLN COMPARISON:  Prior noncontrast brain MRI from earlier the same day. FINDINGS: Brain: Examination moderately degraded by motion artifact. Precontrast T1 weighted sequence demonstrates no definite new intracranial abnormality since previous MRI from earlier the same day. Following contrast administration, no pathologic enhancement is seen. No visible mass lesion, mass effect or midline shift. No hydrocephalus or extra-axial fluid collection. No other new intracranial abnormality. Vascular: Major intracranial vascular flow voids grossly maintained. Skull and upper cervical spine: Craniocervical junction within normal limits. Bone marrow signal intensity normal. No scalp soft tissue abnormality. Sinuses/Orbits: Globes and orbital soft tissues demonstrate no acute finding. Paranasal sinuses and mastoid air cells are grossly clear. Other: None. IMPRESSION: No pathologic enhancement or MR evidence for acute intracranial infection. Electronically Signed   By: Rise Mu M.D.   On: 09/16/2021 02:21   DG Chest Port 1 View  Result Date: 09/14/2021 CLINICAL DATA:  Short of breath EXAM: PORTABLE CHEST 1 VIEW COMPARISON:  09/10/2021 FINDINGS: Heart size and vascularity normal. Negative for heart failure. Atherosclerotic calcification aorta Mild left lower lobe airspace disease likely atelectasis. This has developed in  the interval. Minimal bilateral effusions. Right lung otherwise clear. NG tube enters the stomach with the tip not visualized. IMPRESSION: Interval development of mild left lower lobe atelectasis and minimal pleural effusion bilaterally. Negative for heart failure. Electronically Signed   By: Marlan Palau M.D.   On: 09/14/2021 10:06        Scheduled Meds:  apixaban  5 mg Oral BID   atorvastatin  40 mg Oral Daily   carvedilol  25 mg Oral BID WC   feeding supplement (PROSource TF)  45 mL Per Tube BID   free water  200 mL Per Tube Q4H   lisinopril  40 mg Oral Daily   methimazole  5 mg Oral Daily   pantoprazole (PROTONIX) IV  40 mg Intravenous Q24H   spironolactone  50 mg Oral Daily   Continuous Infusions:  acyclovir 485 mg (09/16/21 0336)   ampicillin (OMNIPEN) IV 2 g (09/16/21 0232)   cefTRIAXone (ROCEPHIN)  IV 2 g (09/16/21 0258)   feeding supplement (OSMOLITE 1.5 CAL) 1,000 mL (09/15/21 0803)   vancomycin       LOS: 9 days    Time spent: 26 mins     Charise Killian, MD [facility] Pager [phone]  If 7PM-7AM, please contact night-coverage 09/16/2021, 7:31 AM

## 2021-09-16 NOTE — Progress Notes (Signed)
Occupational Therapy Treatment ?Patient Details ?Name: Royce Sciara ?MRN: [id number] ?DOB: Feb 21, 1951 ?Today's Date: 09/16/2021 ? ? ?History of present illness Tonika Eden is a 71 y.o. female with medical history significant for nicotine dependence, chronic pain syndrome, dyslipidemia who was brought into the ER by EMS for evaluation of mental status changes for about 4 days.  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 ?  ?OT comments ? Pt seen for OT and co-tx with PT. Pt required TOTAL A +2 for bed mobility and tolerated sitting EOB for ~24min with varying sitting balance assist (SBA to TOTAL A), pt engaged in seated simple tasks with 1 step commands, pt able to follow intermittently to attempt grasping items and engage in BLE AROM with PT. MAX A for bed level grooming tasks. RN notified of secretion sounds noted at end of session once returned to bed, HOB elevated >30*. Pt more alert and engaged this session, continuing to benefit from skilled OT services to maximize return to independence.   ? ?Recommendations for follow up therapy are one component of a multi-disciplinary discharge planning process, led by the attending physician.  Recommendations may be updated based on patient status, additional functional criteria and insurance authorization. ?   ?Follow Up Recommendations ? Skilled nursing-short term rehab (<3 hours/day)  ?  ?Assistance Recommended at Discharge Frequent or constant Supervision/Assistance  ?Patient can return home with the following ? Two people to help with walking and/or transfers;Two people to help with bathing/dressing/bathroom;Help with stairs or ramp for entrance;Assist for transportation;Assistance with cooking/housework;Direct supervision/assist for medications management;Direct supervision/assist for financial management ?  ?Equipment Recommendations ? BSC/3in1  ?   ?Recommendations for Other Services   ? ?  ?Precautions / Restrictions Precautions ?Precautions: Fall ?Precaution Comments: Aspiration; feeding tube ?Restrictions ?Weight Bearing Restrictions: No  ? ? ?  ? ?Mobility Bed Mobility ?Overal bed mobility: Needs Assistance ?Bed Mobility: Supine to Sit, Sit to Supine ?  ?  ?Supine to sit: Total assist, +2 for physical assistance ?Sit to supine: Total assist, +2 for physical assistance ?  ?General bed mobility comments: assist for trunk and B LE's (use of bed sheet to perform) ?  ? ?Transfers ?  ?  ?  ?  ?  ?  ?  ?  ?  ?General transfer comment: not safe at this time ?  ?  ?Balance Overall balance assessment: Needs assistance ?Sitting-balance support: Feet supported, Bilateral upper extremity supported ?Sitting balance-[name]: Poor ?Sitting balance - Comments: varying between total assist to close SBA ?Postural control: Posterior lean ?  ?  ?  ?  ?  ?  ?  ?  ?  ?  ?  ?  ?  ?  ?   ? ?ADL either performed or assessed with clinical judgement  ? ?ADL Overall ADL's : Needs assistance/impaired ?  ?  ?  ?  ?  ?  ?  ?  ?  ?  ?  ?  ?  ?  ?  ?  ?  ?  ?  ?General ADL Comments: Pt requires TOTAL A for grooming tasks from bed level, pt unable to engage BUE to attempt ?  ? ?Extremity/Trunk Assessment   ?  ?  ?  ?  ?  ? ?Vision   ?  ?  ?Perception   ?  ?Praxis   ?  ? ?Cognition Arousal/Alertness: Awake/alert ?Behavior During Therapy: Flat affect ?  Overall Cognitive Status: Impaired/Different from baseline ?Area of Impairment: Orientation, Following commands, Safety/judgement, Attention, Problem solving ?  ?  ?  ?  ?  ?  ?  ?  ?  ?Current Attention Level: Selective ?  ?Following Commands: Follows one step commands inconsistently ?Safety/Judgement: Decreased awareness of safety, Decreased awareness of deficits ?  ?Problem Solving: Slow processing, Decreased initiation ?General Comments: Follows 1 step commands <25% of time; increased time for processing, sponteneous movements in LUE  noted ?  ?  ?   ?Exercises Other Exercises ?Other Exercises: With varying sitting balance assist, pt engaged in seated simple tasks with 1 step commands, pt able to follow intermittently to attempt grasping items and engage in BLE AROM with PT. ? ?  ?Shoulder Instructions   ? ? ?  ?General Comments    ? ? ?Pertinent Vitals/ Pain       Pain Assessment ?Pain Assessment: Faces ?Faces Pain Scale: Hurts little more ?Pain Location: at end of sessio when nursing was attempting to draw for labs ?Pain Descriptors / Indicators: Grimacing, Moaning ? ?Home Living   ?  ?  ?  ?  ?  ?  ?  ?  ?  ?  ?  ?  ?  ?  ?  ?  ?  ?  ? ?  ?Prior Functioning/Environment    ?  ?  ?  ?   ? ?Frequency ? Min 3X/week  ? ? ? ? ?  ?Progress Toward Goals ? ?OT Goals(current goals can now be found in the care plan section) ? Progress towards OT goals: OT to reassess next treatment ? ?Acute Rehab OT Goals ?Patient Stated Goal: none stated ?OT Goal Formulation: Patient unable to participate in goal setting ?Time For Goal Achievement: 09/24/21 ?Potential to Achieve Goals: Fair  ?Plan Discharge plan remains appropriate;Frequency remains appropriate   ? ?Co-evaluation ? ? ? PT/OT/SLP Co-Evaluation/Treatment: Yes ?Reason for Co-Treatment: Necessary to address cognition/behavior during functional activity;For patient/therapist safety;To address functional/ADL transfers ?PT goals addressed during session: Mobility/safety with mobility;Balance ?OT goals addressed during session: ADL's and self-care ?  ? ?  ?AM-PAC OT "6 Clicks" Daily Activity     ?Outcome Measure ? ? Help from another person eating meals?: Total (NPO, tube feeds) ?Help from another person taking care of personal grooming?: A Lot ?Help from another person toileting, which includes using toliet, bedpan, or urinal?: Total ?Help from another person bathing (including washing, rinsing, drying)?: A Lot ?Help from another person to put on and taking off regular upper body clothing?: A Lot ?Help from  another person to put on and taking off regular lower body clothing?: Total ?6 Click Score: 9 ? ?  ?End of Session   ? ?OT Visit Diagnosis: Other abnormalities of gait and mobility (R26.89);Muscle weakness (generalized) (M62.81) ?  ?Activity Tolerance Patient tolerated treatment well ?  ?Patient Left in bed;with call bell/phone within reach;with bed alarm set;with nursing/sitter in room ?  ?Nurse Communication Mobility status;Other (comment) (needs suctioning?) ?  ? ?   ? ?Time: 3244-0102 ?OT Time Calculation (min): 25 min ? ?Charges: OT General Charges ?$OT Visit: 1 Visit ?OT Treatments ?$Therapeutic Activity: 8-22 mins ? ?Arman Filter., MPH, MS, OTR/L ?ascom [phone] ?09/16/21, 1:30 PM ? ?

## 2021-09-16 NOTE — Progress Notes (Signed)
ANTICOAGULATION CONSULT NOTE  ? ?Pharmacy Consult for IV heparin ?Indication: atrial fibrillation ? ?Not on File ? ?Patient Measurements: ?Height: 5\' 10"  (177.8 cm) ?Weight: 49.2 kg (108 lb 7.5 oz) ?IBW/kg (Calculated) : 68.5 ?Heparin Dosing Weight: 49.2 kg ? ?Vital Signs: ?Temp: 98 ?F (36.7 ?C) (03/10 1600) ?Temp Source: Axillary (03/10 1157) ?BP: 125/58 (03/10 1600) ?Pulse Rate: 65 (03/10 1600) ? ?Labs: ?Recent Labs  ?  09/14/21 ?0620 09/15/21 ?0446 09/16/21 ?0431 09/16/21 ?0922 09/16/21 ?1817  ?HGB 16.9* 16.0* 15.6*  --   --   ?HCT 49.7* 48.2* 47.9*  --   --   ?PLT 212 218 240  --   --   ?APTT  --   --   --  28 55*  ?HEPARINUNFRC  --   --   --  >1.10*  --   ?CREATININE 0.68 0.58 0.65  --   --   ? ? ? ?Estimated Creatinine Clearance: 50.1 mL/min (by C-G formula based on SCr of 0.65 mg/dL). ? ? ?Medical History: ?Past Medical History:  ?Diagnosis Date  ? Fibromyalgia   ? ? ?Medications:  ?Medications Prior to Admission  ?Medication Sig Dispense Refill Last Dose  ? albuterol (VENTOLIN HFA) 108 (90 Base) MCG/ACT inhaler Inhale into the lungs every 6 (six) hours as needed for wheezing or shortness of breath.   Past Week at unknown prn  ? atorvastatin (LIPITOR) 40 MG tablet Take 40 mg by mouth daily.   Past Week  ? carboxymethylcellulose 1 % ophthalmic solution 1 drop 3 (three) times daily.   Past Week  ? celecoxib (CELEBREX) 200 MG capsule Take 200 mg by mouth 2 (two) times daily.   Past Week  ? cilostazol (PLETAL) 100 MG tablet Take 100 mg by mouth 2 (two) times daily.   Past Week  ? fluticasone (FLONASE) 50 MCG/ACT nasal spray Place into both nostrils daily.   Past Week at unknown prn  ? gabapentin (NEURONTIN) 800 MG tablet Take 800 mg by mouth 3 (three) times daily.   Past Week  ? omeprazole (PRILOSEC) 40 MG capsule Take 40 mg by mouth daily.   Past Week  ? ondansetron (ZOFRAN) 8 MG tablet Take by mouth every 8 (eight) hours as needed for nausea or vomiting.   Past Week at unknown prn  ? oxyCODONE-acetaminophen  (PERCOCET) 10-325 MG tablet Take 1 tablet by mouth every 4 (four) hours as needed for pain.   Past Week  ? sodium chloride (OCEAN) 0.65 % SOLN nasal spray Place 1 spray into both nostrils as needed for congestion.   Past Week at unknown prn  ? ?Scheduled:  ? atorvastatin  40 mg Oral Daily  ? carvedilol  25 mg Oral BID WC  ? feeding supplement (PROSource TF)  45 mL Per Tube BID  ? free water  200 mL Per Tube Q4H  ? lisinopril  40 mg Oral Daily  ? methimazole  5 mg Oral Daily  ? pantoprazole (PROTONIX) IV  40 mg Intravenous Q24H  ? spironolactone  50 mg Oral Daily  ? ?Infusions:  ? acyclovir 485 mg (09/16/21 1618)  ? ampicillin (OMNIPEN) IV 2 g (09/16/21 1322)  ? cefTRIAXone (ROCEPHIN)  IV 2 g (09/16/21 1546)  ? feeding supplement (OSMOLITE 1.5 CAL) 1,000 mL (09/15/21 0803)  ? heparin 750 Units/hr (09/16/21 0951)  ? vancomycin    ? ?PRN: albuterol, ondansetron **OR** ondansetron (ZOFRAN) IV, oxyCODONE-acetaminophen ?Anti-infectives (From admission, onward)  ? ? Start     Dose/Rate Route Frequency Ordered Stop  ?  09/16/21 2300  vancomycin (VANCOCIN) IVPB 1000 mg/200 mL premix       ?See Hyperspace for full Linked Orders Report.  ? 1,000 mg ?200 mL/hr over 60 Minutes Intravenous Every 24 hours 09/15/21 2157    ? 09/15/21 2300  acyclovir (ZOVIRAX) 485 mg in dextrose 5 % 100 mL IVPB       ? 10 mg/kg ? 48.4 kg ?109.7 mL/hr over 60 Minutes Intravenous Every 12 hours 09/15/21 2146    ? 09/15/21 2300  cefTRIAXone (ROCEPHIN) 2 g in sodium chloride 0.9 % 100 mL IVPB       ? 2 g ?200 mL/hr over 30 Minutes Intravenous Every 12 hours 09/15/21 2146    ? 09/15/21 2300  ampicillin (OMNIPEN) 2 g in sodium chloride 0.9 % 100 mL IVPB       ? 2 g ?300 mL/hr over 20 Minutes Intravenous Every 6 hours 09/15/21 2206    ? 09/15/21 2245  vancomycin (VANCOREADY) IVPB 1250 mg/250 mL       ?See Hyperspace for full Linked Orders Report.  ? 1,250 mg ?166.7 mL/hr over 90 Minutes Intravenous  Once 09/15/21 2157 09/16/21 0106  ? 09/08/21 1200   cefTRIAXone (ROCEPHIN) 2 g in sodium chloride 0.9 % 100 mL IVPB       ? 2 g ?200 mL/hr over 30 Minutes Intravenous Every 24 hours 09/07/21 1506 09/13/21 1447  ? 09/07/21 2200  metroNIDAZOLE (FLAGYL) IVPB 500 mg  Status:  Discontinued       ? 500 mg ?100 mL/hr over 60 Minutes Intravenous Every 12 hours 09/07/21 1506 09/08/21 1407  ? 09/07/21 1445  metroNIDAZOLE (FLAGYL) IVPB 500 mg       ? 500 mg ?100 mL/hr over 60 Minutes Intravenous  Once 09/07/21 1435 09/07/21 1545  ? 09/07/21 1230  cefTRIAXone (ROCEPHIN) 1 g in sodium chloride 0.9 % 100 mL IVPB       ? 1 g ?200 mL/hr over 30 Minutes Intravenous  Once 09/07/21 1217 09/07/21 1320  ? 09/07/21 1230  azithromycin (ZITHROMAX) 500 mg in sodium chloride 0.9 % 250 mL IVPB       ? 500 mg ?250 mL/hr over 60 Minutes Intravenous  Once 09/07/21 1217 09/07/21 1424  ? ?  ? ? ?Assessment: ?70YOF PMH fibromyalgia, nicotine dependence, chronic pain syndrome and dyslipidemia who presented with AMS over past 4 days. Encephalopathic, now with new c/f meningitis/encephalitis. ? ?Switching to heparin in order to obtain LP. Planning to hold IV heparin x 4 hours prior to LP and restart 2 hours after LP. ? ?CBC stable (Hgb 15.6, Ptls 240). ? ?Date Time HL/aPTT Rate/Comment  ?3/10 1817 aPTT 55  Subthera 750 > 900 units/hr ? ?Goal of Therapy:  ?Heparin level 0.3-0.7 units/ml ?aPTT 66-102 seconds ?Monitor platelets by anticoagulation protocol: Yes ?  ?Plan:  ?Heparin 1500 unit bolus x 1 ?Increase heparin infusion to 900 units/hr ?Recheck aPTT/HL in 8 hours  ?Daily CBC ? ? ?Sharen Hones, PharmD, BCPS ?Clinical Pharmacist   ?09/16/2021 ?6:57 PM ? ? ?

## 2021-09-16 NOTE — Progress Notes (Addendum)
[facility]  ROUNDING NOTE   Subjective:   Patient seen resting quietly Makes eye contact when name is called.  No family at bedside No lower extremity edema  Objective:  Vital signs in last 24 hours:  Temp:  [97.6 F (36.4 C)-98.1 F (36.7 C)] 97.9 F (36.6 C) (03/10 1157) Pulse Rate:  [63-78] 68 (03/10 1157) Resp:  [19-28] 19 (03/10 1157) BP: (108-124)/(55-65) 112/55 (03/10 1157) SpO2:  [94 %-99 %] 96 % (03/10 1157) Weight:  [49.2 kg] 49.2 kg (03/10 0549)  Weight change: 0.8 kg Filed Weights   09/14/21 0346 09/15/21 0348 09/16/21 0549  Weight: 48.5 kg 48.4 kg 49.2 kg    Intake/Output: I/O last 3 completed shifts: In: 2100 [NG/GT:1650; IV Piggyback:450] Out: -    Intake/Output this shift:  Total I/O In: 0  Out: 850 [Urine:850]  Physical Exam: General: NAD  Head: Normocephalic, atraumatic. Dry oral mucosal membranes, NGT  Eyes: Anicteric  Lungs:  Diminished in bases, tachypnea, [place] O2  Heart: Regular rate and rhythm  Abdomen:  Soft, nontender  Extremities: No peripheral edema.  Neurologic: Alert, following simple commands  Skin: No lesions  Access: None    Basic Metabolic Panel: Recent Labs  Lab 09/10/21 0846 09/11/21 0544 09/12/21 0425 09/12/21 1304 09/12/21 1715 09/13/21 0619 09/13/21 1713 09/14/21 0620 09/15/21 0446 09/16/21 0431  NA 152*   < > 146*  --   --  140  --  144 140 140  K 3.8   < > 3.7  --   --  3.6  --  4.0 4.1 4.5  CL 117*   < > 109  --   --  102  --  108 105 106  CO2 26   < > 28  --   --  28  --  GLUCOSE 149*   < > 127*  --   --  146*  --  154* 138* 137*  BUN 33*   < > 26*  --   --  25*  --  33* 36* 36*  CREATININE 0.88   < > 0.75  --   --  0.68  --  0.68 0.58 0.65  CALCIUM 9.0   < > 8.7*  --   --  8.8*  --  9.1 8.8* 9.0  MG 2.0  --   --  1.6* 1.7 1.8 1.8  --   --   --   PHOS  --   --   --  3.1 2.9 2.8 2.6  --   --   --    < > = values in this interval not displayed.     Liver Function Tests: Recent  Labs  Lab 09/11/21 0544 09/12/21 0425 09/14/21 0620  AST ALT ALKPHOS 84 61 76  BILITOT 0.8 0.6 0.8  PROT 6.8 6.5 6.1*  ALBUMIN 2.8* 2.8* 2.8*    No results for input(s): LIPASE, AMYLASE in the last 168 hours.  No results for input(s): AMMONIA in the last 168 hours.  CBC: Recent Labs  Lab 09/12/21 0425 09/13/21 0619 09/14/21 0620 09/15/21 0446 09/16/21 0431  WBC 17.4* 19.5* 22.8* 26.6* 25.9*  HGB 16.4* 17.3* 16.9* 16.0* 15.6*  HCT 49.6* 50.7* 49.7* 48.2* 47.9*  MCV 94.7 92.2 92.7 94.9 96.6  PLT 206 195 212 218 240     Cardiac Enzymes: No results for input(s): CKTOTAL, CKMB, CKMBINDEX, TROPONINI in the last 168 hours.  BNP:  Invalid input(s): POCBNP  CBG: Recent Labs  Lab 09/15/21 2044 09/16/21 0015 09/16/21 0414 09/16/21 0728 09/16/21 1200  GLUCAP 138* 124* 110* 137* 138*     Microbiology: Results for orders placed or performed during the hospital encounter of 09/07/21  Blood culture (single)     Status: None   Collection Time: 09/07/21 11:09 AM   Specimen: BLOOD LEFT ARM  Result Value Ref Range Status   Specimen Description BLOOD LEFT ARM  Final   Special Requests   Final    BOTTLES DRAWN AEROBIC AND ANAEROBIC Blood Culture adequate volume   Culture   Final    NO GROWTH 5 DAYS Performed at [facility], [address] [place], [place] [address]    Report Status 09/12/2021 FINAL  Final  Resp Panel by RT-PCR (Flu A&B, Covid) Nasopharyngeal Swab     Status: None   Collection Time: 09/07/21 11:09 AM   Specimen: Nasopharyngeal Swab; Nasopharyngeal(NP) swabs in vial transport medium  Result Value Ref Range Status   SARS Coronavirus 2 by RT PCR NEGATIVE NEGATIVE Final    Comment: (NOTE) SARS-CoV-2 target nucleic acids are NOT DETECTED.  The SARS-CoV-2 RNA is generally detectable in upper respiratory specimens during the acute phase of infection. The lowest concentration of SARS-CoV-2 viral copies this assay can detect  is 138 copies/mL. A negative result does not preclude SARS-Cov-2 infection and should not be used as the sole basis for treatment or other patient management decisions. A negative result may occur with  improper specimen collection/handling, submission of specimen other than nasopharyngeal swab, presence of viral mutation(s) within the areas targeted by this assay, and inadequate number of viral copies(<138 copies/mL). A negative result must be combined with clinical observations, patient history, and epidemiological information. The expected result is Negative.  Fact Sheet for Patients:  BloggerCourse.com  Fact Sheet for Healthcare Providers:  SeriousBroker.it  This test is no t yet approved or cleared by the [place] FDA and  has been authorized for detection and/or diagnosis of SARS-CoV-2 by FDA under an Emergency Use Authorization (EUA). This EUA will remain  in effect (meaning this test can be used) for the duration of the COVID-19 declaration under Section 564(b)(1) of the Act, 21 U.S.C.section 360bbb-3(b)(1), unless the authorization is terminated  or revoked sooner.       Influenza A by PCR NEGATIVE NEGATIVE Final   Influenza B by PCR NEGATIVE NEGATIVE Final    Comment: (NOTE) The Xpert Xpress SARS-CoV-2/FLU/RSV plus assay is intended as an aid in the diagnosis of influenza from Nasopharyngeal swab specimens and should not be used as a sole basis for treatment. Nasal washings and aspirates are unacceptable for Xpert Xpress SARS-CoV-2/FLU/RSV testing.  Fact Sheet for Patients: BloggerCourse.com  Fact Sheet for Healthcare Providers: SeriousBroker.it  This test is not yet approved or cleared by the [place] FDA and has been authorized for detection and/or diagnosis of SARS-CoV-2 by FDA under an Emergency Use Authorization (EUA). This EUA will remain in effect  (meaning this test can be used) for the duration of the COVID-19 declaration under Section 564(b)(1) of the Act, 21 U.S.C. section 360bbb-3(b)(1), unless the authorization is terminated or revoked.  Performed at [facility], [address], [place], [place] [address]   Urine Culture     Status: Abnormal   Collection Time: 09/07/21 11:23 AM   Specimen: Urine, Clean Catch  Result Value Ref Range Status   Specimen Description   Final    URINE, CLEAN CATCH  Performed at [facility], [address] [place], [place] [address]    Special Requests   Final    NONE Performed at [facility], [address] [place], [place] [address]    Culture >=100,000 COLONIES/mL ESCHERICHIA COLI (A)  Final   Report Status 09/11/2021 FINAL  Final   Organism ID, Bacteria ESCHERICHIA COLI (A)  Final      Susceptibility   Escherichia coli - MIC*    AMPICILLIN >=32 RESISTANT Resistant     CEFAZOLIN <=4 SENSITIVE Sensitive     CEFEPIME <=0.12 SENSITIVE Sensitive     CEFTRIAXONE <=0.25 SENSITIVE Sensitive     CIPROFLOXACIN <=0.25 SENSITIVE Sensitive     GENTAMICIN <=1 SENSITIVE Sensitive     IMIPENEM <=0.25 SENSITIVE Sensitive     NITROFURANTOIN <=16 SENSITIVE Sensitive     TRIMETH/SULFA <=20 SENSITIVE Sensitive     AMPICILLIN/SULBACTAM >=32 RESISTANT Resistant     PIP/TAZO <=4 SENSITIVE Sensitive     * >=100,000 COLONIES/mL ESCHERICHIA COLI  C Difficile Quick Screen w PCR reflex     Status: None   Collection Time: 09/07/21  7:21 PM   Specimen: STOOL  Result Value Ref Range Status   C Diff antigen NEGATIVE NEGATIVE Final   C Diff toxin NEGATIVE NEGATIVE Final   C Diff interpretation No C. difficile detected.  Final    Comment: Performed at [facility], [address] [place], [place] [address]  Gastrointestinal Panel by PCR , Stool     Status: None   Collection Time: 09/07/21  7:21 PM   Specimen: STOOL  Result Value Ref Range Status   Campylobacter  species NOT DETECTED NOT DETECTED Final   Plesimonas shigelloides NOT DETECTED NOT DETECTED Final   Salmonella species NOT DETECTED NOT DETECTED Final   Yersinia enterocolitica NOT DETECTED NOT DETECTED Final   Vibrio species NOT DETECTED NOT DETECTED Final   Vibrio cholerae NOT DETECTED NOT DETECTED Final   Enteroaggregative E coli (EAEC) NOT DETECTED NOT DETECTED Final   Enteropathogenic E coli (EPEC) NOT DETECTED NOT DETECTED Final   Enterotoxigenic E coli (ETEC) NOT DETECTED NOT DETECTED Final   Shiga like toxin producing E coli (STEC) NOT DETECTED NOT DETECTED Final   Shigella/Enteroinvasive E coli (EIEC) NOT DETECTED NOT DETECTED Final   Cryptosporidium NOT DETECTED NOT DETECTED Final   Cyclospora cayetanensis NOT DETECTED NOT DETECTED Final   Entamoeba histolytica NOT DETECTED NOT DETECTED Final   Giardia lamblia NOT DETECTED NOT DETECTED Final   Adenovirus F40/41 NOT DETECTED NOT DETECTED Final   Astrovirus NOT DETECTED NOT DETECTED Final   Norovirus GI/GII NOT DETECTED NOT DETECTED Final   Rotavirus A NOT DETECTED NOT DETECTED Final   Sapovirus (I, II, IV, and V) NOT DETECTED NOT DETECTED Final    Comment: Performed at [facility], [address] [place], [place] [address]    Coagulation Studies: No results for input(s): LABPROT, INR in the last 72 hours.   Urinalysis: No results for input(s): COLORURINE, LABSPEC, PHURINE, GLUCOSEU, HGBUR, BILIRUBINUR, KETONESUR, PROTEINUR, UROBILINOGEN, NITRITE, LEUKOCYTESUR in the last 72 hours.  Invalid input(s): APPERANCEUR    Imaging: MR BRAIN WO CONTRAST  Result Date: 09/15/2021 CLINICAL DATA:  Delirium EXAM: MRI HEAD WITHOUT CONTRAST TECHNIQUE: Multiplanar, multiecho pulse sequences of the brain and surrounding structures were obtained without intravenous contrast. COMPARISON:  None. FINDINGS: Motion artifact is present. Brain: Small focus of mildly reduced diffusion in the left thalamocapsular region. A punctate focus  of diffusion hyperintensity and ADC  isointensity is present in the right thalamus. No evidence of intracranial hemorrhage. There is no intracranial mass, mass effect, or edema. There is no hydrocephalus or extra-axial fluid collection. Prominence of the ventricles and sulci reflects parenchymal volume loss. Patchy T2 hyperintensity in the supratentorial white matter is nonspecific but may reflect mild to moderate chronic microvascular ischemic changes. There is a small chronic infarct of the right frontal white matter. Vascular: Major vessel flow voids at the skull base are preserved. Skull and upper cervical spine: Small lesion of the right parietal calvarium along the inner table is probably benign. Sinuses/Orbits: Paranasal sinuses are aerated. Orbits are unremarkable. Other: Sella is unremarkable.  Mastoid air cells are clear. 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 Additional punctate more subacute appearing right thalamic infarct. Mild to moderate chronic microvascular ischemic changes. 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 Electronically Signed   By: Guadlupe Spanish M.D.   On: 09/15/2021 14:26   MR BRAIN W CONTRAST  Result Date: 09/16/2021 CLINICAL DATA:  Initial evaluation for intracranial infection. EXAM: MRI HEAD WITH CONTRAST TECHNIQUE: Multiplanar, multiecho pulse sequences of the brain and surrounding structures were obtained with intravenous contrast. CONTRAST:  42mL GADAVIST GADOBUTROL 1 MMOL/ML IV SOLN COMPARISON:  Prior noncontrast brain MRI from earlier the same day. FINDINGS: Brain: Examination moderately degraded by motion artifact. Precontrast T1 weighted sequence demonstrates no definite new intracranial abnormality since previous MRI from earlier the same day. Following contrast administration, no pathologic enhancement is seen. No visible mass lesion, mass effect or midline shift. No hydrocephalus or extra-axial fluid collection. No other new  intracranial abnormality. Vascular: Major intracranial vascular flow voids grossly maintained. Skull and upper cervical spine: Craniocervical junction within normal limits. Bone marrow signal intensity normal. No scalp soft tissue abnormality. Sinuses/Orbits: Globes and orbital soft tissues demonstrate no acute finding. Paranasal sinuses and mastoid air cells are grossly clear. Other: None. IMPRESSION: No pathologic enhancement or MR evidence for acute intracranial infection. Electronically Signed   By: Rise Mu M.D.   On: 09/16/2021 02:21     Medications:    acyclovir 485 mg (09/16/21 0336)   ampicillin (OMNIPEN) IV 2 g (09/16/21 0848)   cefTRIAXone (ROCEPHIN)  IV 2 g (09/16/21 0258)   feeding supplement (OSMOLITE 1.5 CAL) 1,000 mL (09/15/21 0803)   heparin 750 Units/hr (09/16/21 0951)   vancomycin      atorvastatin  40 mg Oral Daily   carvedilol  25 mg Oral BID WC   feeding supplement (PROSource TF)  45 mL Per Tube BID   free water  200 mL Per Tube Q4H   lisinopril  40 mg Oral Daily   methimazole  5 mg Oral Daily   pantoprazole (PROTONIX) IV  40 mg Intravenous Q24H   spironolactone  50 mg Oral Daily   albuterol, ondansetron **OR** ondansetron (ZOFRAN) IV, oxyCODONE-acetaminophen  Assessment/ Plan:  Ms. Joy Clark is a 71 y.o.  female a PMH significant for nicotine dependence, chronic pain syndrome, HLD who was brought to the ER with chief complaint of altered mental status.  Hypernatremia- secondary to GI losses, insensible losses, tachypnea and inability to ask for water as patient has altered mental status.     Sodium 140. Continue every 4 hour flushes with tube feeds.   2.  Hypertension  - Currently receiving carvedilol 25 mg twice daily, lisinopril 40 mg daily and spironolactone 50 mg daily.  Blood pressure 112/55   3.  Polycythemia   Hemoglobin has ranged from  16.2-17.6 this admission.   Patient has history of heavy tobacco use, 2 packs per day.   hemoglobin 15.6 today  4.  Chronic diastolic heart failure echo completed on 09/09/2021 shows EF 60 to 65% with a grade 1 diastolic dysfunction.      5.  Urinary tract infection. completed IV antibiotic therapy - E Coli in urine culture on 09/08/21  6. Acute CVA MRI on 09/15/21 shows small acute infarct in the left thalamocapsular region and punctate more subacute in right thalamic infarct. Heparin drip in place    LOS: 9   3/10/20231:12 PM

## 2021-09-16 NOTE — Progress Notes (Addendum)
ANTICOAGULATION CONSULT NOTE  ? ?Pharmacy Consult for IV heparin ?Indication: atrial fibrillation ? ?Not on File ? ?Patient Measurements: ?Height: 5\' 10"  (177.8 cm) ?Weight: 49.2 kg (108 lb 7.5 oz) ?IBW/kg (Calculated) : 68.5 ?Heparin Dosing Weight: 49.2 kg ? ?Vital Signs: ?Temp: 97.8 ?F (36.6 ?C) (03/10 11-01-2005) ?Temp Source: Axillary (03/10 11-01-2005) ?BP: 110/63 (03/10 0728) ?Pulse Rate: 78 (03/10 0728) ? ?Labs: ?Recent Labs  ?  09/14/21 ?0620 09/15/21 ?0446 09/16/21 ?0431  ?HGB 16.9* 16.0* 15.6*  ?HCT 49.7* 48.2* 47.9*  ?PLT 212 218 240  ?CREATININE 0.68 0.58 0.65  ? ? ?Estimated Creatinine Clearance: 50.1 mL/min (by C-G formula based on SCr of 0.65 mg/dL). ? ? ?Medical History: ?Past Medical History:  ?Diagnosis Date  ? Fibromyalgia   ? ? ?Medications:  ?Medications Prior to Admission  ?Medication Sig Dispense Refill Last Dose  ? albuterol (VENTOLIN HFA) 108 (90 Base) MCG/ACT inhaler Inhale into the lungs every 6 (six) hours as needed for wheezing or shortness of breath.   Past Week at unknown prn  ? atorvastatin (LIPITOR) 40 MG tablet Take 40 mg by mouth daily.   Past Week  ? carboxymethylcellulose 1 % ophthalmic solution 1 drop 3 (three) times daily.   Past Week  ? celecoxib (CELEBREX) 200 MG capsule Take 200 mg by mouth 2 (two) times daily.   Past Week  ? cilostazol (PLETAL) 100 MG tablet Take 100 mg by mouth 2 (two) times daily.   Past Week  ? fluticasone (FLONASE) 50 MCG/ACT nasal spray Place into both nostrils daily.   Past Week at unknown prn  ? gabapentin (NEURONTIN) 800 MG tablet Take 800 mg by mouth 3 (three) times daily.   Past Week  ? omeprazole (PRILOSEC) 40 MG capsule Take 40 mg by mouth daily.   Past Week  ? ondansetron (ZOFRAN) 8 MG tablet Take by mouth every 8 (eight) hours as needed for nausea or vomiting.   Past Week at unknown prn  ? oxyCODONE-acetaminophen (PERCOCET) 10-325 MG tablet Take 1 tablet by mouth every 4 (four) hours as needed for pain.   Past Week  ? sodium chloride (OCEAN) 0.65 % SOLN  nasal spray Place 1 spray into both nostrils as needed for congestion.   Past Week at unknown prn  ? ?Scheduled:  ? atorvastatin  40 mg Oral Daily  ? carvedilol  25 mg Oral BID WC  ? feeding supplement (PROSource TF)  45 mL Per Tube BID  ? free water  200 mL Per Tube Q4H  ? lisinopril  40 mg Oral Daily  ? methimazole  5 mg Oral Daily  ? pantoprazole (PROTONIX) IV  40 mg Intravenous Q24H  ? spironolactone  50 mg Oral Daily  ? ?Infusions:  ? acyclovir 485 mg (09/16/21 0336)  ? ampicillin (OMNIPEN) IV 2 g (09/16/21 0232)  ? cefTRIAXone (ROCEPHIN)  IV 2 g (09/16/21 0258)  ? feeding supplement (OSMOLITE 1.5 CAL) 1,000 mL (09/15/21 0803)  ? vancomycin    ? ?PRN: albuterol, ondansetron **OR** ondansetron (ZOFRAN) IV, oxyCODONE-acetaminophen ?Anti-infectives (From admission, onward)  ? ? Start     Dose/Rate Route Frequency Ordered Stop  ? 09/16/21 2300  vancomycin (VANCOCIN) IVPB 1000 mg/200 mL premix       ?See Hyperspace for full Linked Orders Report.  ? 1,000 mg ?200 mL/hr over 60 Minutes Intravenous Every 24 hours 09/15/21 2157    ? 09/15/21 2300  acyclovir (ZOVIRAX) 485 mg in dextrose 5 % 100 mL IVPB       ?  10 mg/kg ? 48.4 kg ?109.7 mL/hr over 60 Minutes Intravenous Every 12 hours 09/15/21 2146    ? 09/15/21 2300  cefTRIAXone (ROCEPHIN) 2 g in sodium chloride 0.9 % 100 mL IVPB       ? 2 g ?200 mL/hr over 30 Minutes Intravenous Every 12 hours 09/15/21 2146    ? 09/15/21 2300  ampicillin (OMNIPEN) 2 g in sodium chloride 0.9 % 100 mL IVPB       ? 2 g ?300 mL/hr over 20 Minutes Intravenous Every 6 hours 09/15/21 2206    ? 09/15/21 2245  vancomycin (VANCOREADY) IVPB 1250 mg/250 mL       ?See Hyperspace for full Linked Orders Report.  ? 1,250 mg ?166.7 mL/hr over 90 Minutes Intravenous  Once 09/15/21 2157 09/16/21 0106  ? 09/08/21 1200  cefTRIAXone (ROCEPHIN) 2 g in sodium chloride 0.9 % 100 mL IVPB       ? 2 g ?200 mL/hr over 30 Minutes Intravenous Every 24 hours 09/07/21 1506 09/13/21 1447  ? 09/07/21 2200  metroNIDAZOLE  (FLAGYL) IVPB 500 mg  Status:  Discontinued       ? 500 mg ?100 mL/hr over 60 Minutes Intravenous Every 12 hours 09/07/21 1506 09/08/21 1407  ? 09/07/21 1445  metroNIDAZOLE (FLAGYL) IVPB 500 mg       ? 500 mg ?100 mL/hr over 60 Minutes Intravenous  Once 09/07/21 1435 09/07/21 1545  ? 09/07/21 1230  cefTRIAXone (ROCEPHIN) 1 g in sodium chloride 0.9 % 100 mL IVPB       ? 1 g ?200 mL/hr over 30 Minutes Intravenous  Once 09/07/21 1217 09/07/21 1320  ? 09/07/21 1230  azithromycin (ZITHROMAX) 500 mg in sodium chloride 0.9 % 250 mL IVPB       ? 500 mg ?250 mL/hr over 60 Minutes Intravenous  Once 09/07/21 1217 09/07/21 1424  ? ?  ? ? ?Assessment: ?70YOF PMH fibromyalgia, nicotine dependence, chronic pain syndrome and dyslipidemia who presented with AMS over past 4 days. Encephalopathic, now with new c/f meningitis/encephalitis. ? ?Switching to heparin in order to obtain LP. Planning to hold IV heparin x 4 hours prior to LP and restart 2 hours after LP. ? ?CBC stable (Hgb 15.6, Ptls 240). ? ?Goal of Therapy:  ?Heparin level 0.3-0.7 units/ml ?Monitor platelets by anticoagulation protocol: Yes ?  ?Plan:  ?No bolus. Start IV heparin 750 units/hr.  ?Time infusion to start 3/10 at 1000 (12 hours since last apixaban dose) ?Follow up baseline HL, aPTT, though expect HL to be elevated given recent apixaban dose. ?8-hour aPTT ?Daily CBC, HL. ? ? ?Jaynie Bream, PharmD ?Pharmacy Resident  ?09/16/2021 ?8:38 AM ? ? ?

## 2021-09-17 DIAGNOSIS — R4182 Altered mental status, unspecified: Secondary | ICD-10-CM | POA: Diagnosis not present

## 2021-09-17 DIAGNOSIS — I639 Cerebral infarction, unspecified: Secondary | ICD-10-CM | POA: Diagnosis not present

## 2021-09-17 DIAGNOSIS — I4891 Unspecified atrial fibrillation: Secondary | ICD-10-CM | POA: Diagnosis not present

## 2021-09-17 DIAGNOSIS — G9341 Metabolic encephalopathy: Secondary | ICD-10-CM | POA: Diagnosis not present

## 2021-09-17 LAB — GLUCOSE, CAPILLARY
Glucose-Capillary: 116 mg/dL — ABNORMAL HIGH (ref 70–99)
Glucose-Capillary: 119 mg/dL — ABNORMAL HIGH (ref 70–99)
Glucose-Capillary: 122 mg/dL — ABNORMAL HIGH (ref 70–99)
Glucose-Capillary: 130 mg/dL — ABNORMAL HIGH (ref 70–99)
Glucose-Capillary: 131 mg/dL — ABNORMAL HIGH (ref 70–99)
Glucose-Capillary: 131 mg/dL — ABNORMAL HIGH (ref 70–99)
Glucose-Capillary: 142 mg/dL — ABNORMAL HIGH (ref 70–99)

## 2021-09-17 LAB — BASIC METABOLIC PANEL
Anion gap: 6 (ref 5–15)
BUN: 26 mg/dL — ABNORMAL HIGH (ref 8–23)
CO2: 27 mmol/L (ref 22–32)
Calcium: 8.3 mg/dL — ABNORMAL LOW (ref 8.9–10.3)
Chloride: 104 mmol/L (ref 98–111)
Creatinine, Ser: 0.66 mg/dL (ref 0.44–1.00)
GFR, Estimated: >60 mL/min (ref >60)
Glucose, Bld: 126 mg/dL — ABNORMAL HIGH (ref 70–99)
Potassium: 4.5 mmol/L (ref 3.5–5.1)
Sodium: 137 mmol/L (ref 135–145)

## 2021-09-17 LAB — APTT
aPTT: 90 seconds — ABNORMAL HIGH (ref 24–36)
aPTT: >160 seconds (ref 24–36)
aPTT: 88 seconds — ABNORMAL HIGH (ref 24–36)

## 2021-09-17 LAB — URINE CULTURE: Culture: <10000 — AB

## 2021-09-17 LAB — CBC
HCT: 43.1 % (ref 36.0–46.0)
Hemoglobin: 14.4 g/dL (ref 12.0–15.0)
MCH: 31.7 pg (ref 26.0–34.0)
MCHC: 33.4 g/dL (ref 30.0–36.0)
MCV: 94.9 fL (ref 80.0–100.0)
Platelets: 260 10*3/uL (ref 150–400)
RBC: 4.54 MIL/uL (ref 3.87–5.11)
RDW: 12.5 % (ref 11.5–15.5)
WBC: 28.8 10*3/uL — ABNORMAL HIGH (ref 4.0–10.5)
nRBC: 0 % (ref 0.0–0.2)

## 2021-09-17 LAB — HEPARIN LEVEL (UNFRACTIONATED): Heparin Unfractionated: 0.95 IU/mL — ABNORMAL HIGH (ref 0.30–0.70)

## 2021-09-17 MED ORDER — DEXTROSE 5 % IV SOLN
500.0000 mg | Freq: Three times a day (TID) | INTRAVENOUS | Status: DC
  Filled 2021-09-17: qty 10

## 2021-09-17 MED ORDER — DEXTROSE 5 % IV SOLN
500.0000 mg | Freq: Three times a day (TID) | INTRAVENOUS | Status: DC
  Administered 2021-09-17 – 2021-09-22 (×16): 500 mg via INTRAVENOUS
  Filled 2021-09-17 (×17): qty 10

## 2021-09-17 MED ORDER — SODIUM CHLORIDE 0.9 % IV SOLN
2.0000 g | INTRAVENOUS | Status: DC
  Administered 2021-09-17 – 2021-09-19 (×13): 2 g via INTRAVENOUS
  Filled 2021-09-17: qty 2000
  Filled 2021-09-17: qty 2
  Filled 2021-09-17: qty 2000
  Filled 2021-09-17: qty 2
  Filled 2021-09-17: qty 2000
  Filled 2021-09-17 (×2): qty 2
  Filled 2021-09-17 (×7): qty 2000
  Filled 2021-09-17: qty 2
  Filled 2021-09-17: qty 2000

## 2021-09-17 NOTE — Progress Notes (Addendum)
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 Continue neuro checks. Continue on IV heparin prepare for LP on 09/19/21 as per neuro. S/p EEG which showed mod-severe diffuse encephalopathy but no seizure or definite epileptiform discharges were seen. Neuro following and recs apprec   Acute metabolic encephalopathy: no hx of dementia as per pt's son. Likely secondary to CVA vs infection. Slight improvement w/ intermittently shaking head yes and no to questions. Continue w/ supportive care  Dysphagia: continue w/ tube feeds but will hold tube feeds starting 09/18/21 at 11PM to prepare for LP on 09/19/21   A. fib: w/ RVR. New onset. Back in sinus rhythm. Continue on IV heparin, coreg    Low TSH:  signs of hyperthyroid. Possible Grave's disease. Thyroid US did not show suspicious nodules. T4 elevated, T3 normal. Continue on coreg, methimazole. Will need to f/u outpatient w/ endocrinology   HTN: uncontrolled. Continue on lisinopril, coreg & spironolactone    Potential for opioid withdrawal: family endorses that patient takes large amount of percocet scheduled for LE pain. Percocet prn    Gallbladder disease: no urgent surgical intervention as per gen surg.   Leukocytosis: labile. Procal <0.10. Continue on IV acyclovir,  ampicillin, rocephin as per ID    Hypokalemia: WNL today.   Hypernatremia: resolved    UTI: completed abx course    Enterocolitis: likely viral vs inflammatory. GI PCR panel, c. diff both neg   Likely moderate protein calorie malnutriton: continue w/ tube feeds but will hold tube feeds starting 09/18/21 at 11PM to prepare for LP on 09/19/21   DVT prophylaxis: eliquis  Code Status:  full  Family Communication: discussed pt's care w/ pt's daughter, [name], and answered her questions  Disposition Plan:  possibly d/c to SNF   Level of care: Progressive  Status is: Inpatient Remains inpatient appropriate because:  LP ordered.    Consultants:  Nephro   Procedures:   Antimicrobials:   Subjective: Pt will intermittently shake her head yes and no to questions, slight improvement from day prior.   Objective: Vitals:   09/16/21 2000 09/16/21 2340 09/17/21 0400 09/17/21 0455  BP: (!) 114/51 131/62 (!) 137/59   Pulse: 64 63 70   Resp: 20 19 20    Temp: 98.2 F (36.8 C) 98.2 F (36.8 C) 98.2 F (36.8 C)   TempSrc: Axillary Axillary Axillary   SpO2: 98% 98% 94%   Weight:    51.1 kg  Height:        Intake/Output Summary (Last 24 hours) at 09/17/2021 0730 Last data filed at 09/17/2021 0400 Gross per 24 hour  Intake 2313.5 ml  Output 1350 ml  Net 963.5 ml   Filed Weights   09/15/21 0348 09/16/21 0549 09/17/21 0455  Weight: 48.4 kg 49.2 kg 51.1 kg  Examination:  General exam: Appears comfortable. Frail appearing  Respiratory system: decreased breath sounds b/l Cardiovascular system: S1 & S2+. No clicks or rubs  Gastrointestinal system: Abd is soft, NT, ND & hypoactive bowel sounds  Central nervous system: Alert, awake & makes eye contact. Able to move b/l LE and LUE.  Psychiatry: judgement and insight appears poor. Flat mood and affect     Data Reviewed: I have personally reviewed following labs and imaging studies  CBC: Recent Labs  Lab 09/13/21 0619  09/14/21 0620 09/15/21 0446 09/16/21 0431 09/17/21 0611  WBC 19.5* 22.8* 26.6* 25.9* 28.8*  HGB 17.3* 16.9* 16.0* 15.6* 14.4  HCT 50.7* 49.7* 48.2* 47.9* 43.1  MCV 92.2 92.7 94.9 96.6 94.9  PLT 195 212 218 240 [id number]   Basic Metabolic Panel: Recent Labs  Lab 09/10/21 0846 09/11/21 0544 09/12/21 1304 09/12/21 1715 09/13/21 0619 09/13/21 1713 09/14/21 0620 09/15/21 0446 09/16/21 0431 09/17/21 0611  NA 152*   < >  --   --  140  --  144 140 140 137  K 3.8   < >  --   --  3.6  --  4.0 4.1 4.5 4.5  CL 117*   < >  --   --  102  --  108 105 106 104  CO2 26   < >  --   --  28  --  26 25 27 27   GLUCOSE 149*   < >  --   --  146*  --  154* 138* 137* 126*  BUN 33*   < >  --   --  25*  --  33* 36* 36* 26*  CREATININE 0.88   < >  --   --  0.68  --  0.68 0.58 0.65 0.66  CALCIUM 9.0   < >  --   --  8.8*  --  9.1 8.8* 9.0 8.3*  MG 2.0  --  1.6* 1.7 1.8 1.8  --   --   --   --   PHOS  --   --  3.1 2.9 2.8 2.6  --   --   --   --    < > = values in this interval not displayed.   GFR: Estimated Creatinine Clearance: 52 mL/min (by C-G formula based on SCr of 0.66 mg/dL). Liver Function Tests: Recent Labs  Lab 09/11/21 0544 09/12/21 0425 09/14/21 0620  AST 19 16 22   ALT 12 13 20   ALKPHOS 84 61 76  BILITOT 0.8 0.6 0.8  PROT 6.8 6.5 6.1*  ALBUMIN 2.8* 2.8* 2.8*   No results for input(s): LIPASE, AMYLASE in the last 168 hours.  No results for input(s): AMMONIA in the last 168 hours. Coagulation Profile: No results for input(s): INR, PROTIME in the last 168 hours.  Cardiac Enzymes: No results for input(s): CKTOTAL, CKMB, CKMBINDEX, TROPONINI in the last 168 hours. BNP (last 3 results) No results for input(s): PROBNP in the last 8760 hours. HbA1C: No results for input(s): HGBA1C in the last 72 hours. CBG: Recent Labs  Lab 09/16/21 1947 09/16/21 2352 09/17/21 0023 09/17/21 0421 09/17/21 0729  GLUCAP 114* 103* 116* 131* 131*   Lipid Profile: No results for input(s): CHOL, HDL,  LDLCALC, TRIG, CHOLHDL, LDLDIRECT in the last 72 hours. Thyroid Function Tests: No results for input(s): TSH, T4TOTAL, FREET4, T3FREE, THYROIDAB in the last 72 hours. Anemia Panel: No results for input(s): VITAMINB12, FOLATE, FERRITIN, TIBC, IRON, RETICCTPCT in the last 72 hours. Sepsis  Labs: Recent Labs  Lab 09/15/21 0446  PROCALCITON <0.10    Recent Results (from the past 240 hour(s))  Blood culture (single)     Status: None   Collection Time: 09/07/21 11:09 AM   Specimen: BLOOD LEFT ARM  Result Value Ref Range Status   Specimen Description BLOOD LEFT ARM  Final   Special Requests   Final    BOTTLES DRAWN AEROBIC AND ANAEROBIC Blood Culture adequate volume   Culture   Final    NO GROWTH 5 DAYS Performed at [facility], [address] [place], [place] [address]    Report Status 09/12/2021 FINAL  Final  Resp Panel by RT-PCR (Flu A&B, Covid) Nasopharyngeal Swab     Status: None   Collection Time: 09/07/21 11:09 AM   Specimen: Nasopharyngeal Swab; Nasopharyngeal(NP) swabs in vial transport medium  Result Value Ref Range Status   SARS Coronavirus 2 by RT PCR NEGATIVE NEGATIVE Final    Comment: (NOTE) SARS-CoV-2 target nucleic acids are NOT DETECTED.  The SARS-CoV-2 RNA is generally detectable in upper respiratory specimens during the acute phase of infection. The lowest concentration of SARS-CoV-2 viral copies this assay can detect is 138 copies/mL. A negative result does not preclude SARS-Cov-2 infection and should not be used as the sole basis for treatment or other patient management decisions. A negative result may occur with  improper specimen collection/handling, submission of specimen other than nasopharyngeal swab, presence of viral mutation(s) within the areas targeted by this assay, and inadequate number of viral copies(<138 copies/mL). A negative result must be combined with clinical observations, patient history, and epidemiological information. The  expected result is Negative.  Fact Sheet for Patients:  EntrepreneurPulse.com.au  Fact Sheet for Healthcare Providers:  IncredibleEmployment.be  This test is no t yet approved or cleared by the [place] FDA and  has been authorized for detection and/or diagnosis of SARS-CoV-2 by FDA under an Emergency Use Authorization (EUA). This EUA will remain  in effect (meaning this test can be used) for the duration of the COVID-19 declaration under Section 564(b)(1) of the Act, 21 U.S.C.section 360bbb-3(b)(1), unless the authorization is terminated  or revoked sooner.       Influenza A by PCR NEGATIVE NEGATIVE Final   Influenza B by PCR NEGATIVE NEGATIVE Final    Comment: (NOTE) The Xpert Xpress SARS-CoV-2/FLU/RSV plus assay is intended as an aid in the diagnosis of influenza from Nasopharyngeal swab specimens and should not be used as a sole basis for treatment. Nasal washings and aspirates are unacceptable for Xpert Xpress SARS-CoV-2/FLU/RSV testing.  Fact Sheet for Patients: EntrepreneurPulse.com.au  Fact Sheet for Healthcare Providers: IncredibleEmployment.be  This test is not yet approved or cleared by the [place] FDA and has been authorized for detection and/or diagnosis of SARS-CoV-2 by FDA under an Emergency Use Authorization (EUA). This EUA will remain in effect (meaning this test can be used) for the duration of the COVID-19 declaration under Section 564(b)(1) of the Act, 21 U.S.C. section 360bbb-3(b)(1), unless the authorization is terminated or revoked.  Performed at [facility], [address] [place], [place] [address]   Urine Culture     Status: Abnormal   Collection Time: 09/07/21 11:23 AM   Specimen: Urine, Clean Catch  Result Value Ref Range Status   Specimen Description   Final    URINE, CLEAN CATCH Performed at [facility], [address]  [place], [place] [address]    Special Requests   Final  NONE Performed at [facility], [address] [place], [place] [address]    Culture >=100,000 COLONIES/mL ESCHERICHIA COLI (A)  Final   Report Status 09/11/2021 FINAL  Final   Organism ID, Bacteria ESCHERICHIA COLI (A)  Final      Susceptibility   Escherichia coli - MIC*    AMPICILLIN >=32 RESISTANT Resistant     CEFAZOLIN <=4 SENSITIVE Sensitive     CEFEPIME <=0.12 SENSITIVE Sensitive     CEFTRIAXONE <=0.25 SENSITIVE Sensitive     CIPROFLOXACIN <=0.25 SENSITIVE Sensitive     GENTAMICIN <=1 SENSITIVE Sensitive     IMIPENEM <=0.25 SENSITIVE Sensitive     NITROFURANTOIN <=16 SENSITIVE Sensitive     TRIMETH/SULFA <=20 SENSITIVE Sensitive     AMPICILLIN/SULBACTAM >=32 RESISTANT Resistant     PIP/TAZO <=4 SENSITIVE Sensitive     * >=100,000 COLONIES/mL ESCHERICHIA COLI  C Difficile Quick Screen w PCR reflex     Status: None   Collection Time: 09/07/21  7:21 PM   Specimen: STOOL  Result Value Ref Range Status   C Diff antigen NEGATIVE NEGATIVE Final   C Diff toxin NEGATIVE NEGATIVE Final   C Diff interpretation No C. difficile detected.  Final    Comment: Performed at [facility], [address] [place], [place] [address]  Gastrointestinal Panel by PCR , Stool     Status: None   Collection Time: 09/07/21  7:21 PM   Specimen: STOOL  Result Value Ref Range Status   Campylobacter species NOT DETECTED NOT DETECTED Final   Plesimonas shigelloides NOT DETECTED NOT DETECTED Final   Salmonella species NOT DETECTED NOT DETECTED Final   Yersinia enterocolitica NOT DETECTED NOT DETECTED Final   Vibrio species NOT DETECTED NOT DETECTED Final   Vibrio cholerae NOT DETECTED NOT DETECTED Final   Enteroaggregative E coli (EAEC) NOT DETECTED NOT DETECTED Final   Enteropathogenic E coli (EPEC) NOT DETECTED NOT DETECTED Final   Enterotoxigenic E coli (ETEC) NOT DETECTED NOT DETECTED Final   Shiga like toxin producing E  coli (STEC) NOT DETECTED NOT DETECTED Final   Shigella/Enteroinvasive E coli (EIEC) NOT DETECTED NOT DETECTED Final   Cryptosporidium NOT DETECTED NOT DETECTED Final   Cyclospora cayetanensis NOT DETECTED NOT DETECTED Final   Entamoeba histolytica NOT DETECTED NOT DETECTED Final   Giardia lamblia NOT DETECTED NOT DETECTED Final   Adenovirus F40/41 NOT DETECTED NOT DETECTED Final   Astrovirus NOT DETECTED NOT DETECTED Final   Norovirus GI/GII NOT DETECTED NOT DETECTED Final   Rotavirus A NOT DETECTED NOT DETECTED Final   Sapovirus (I, II, IV, and V) NOT DETECTED NOT DETECTED Final    Comment: Performed at [facility], [address], [place], [place] [address]         Radiology Studies: MR BRAIN WO CONTRAST  Result Date: 09/15/2021 CLINICAL DATA:  Delirium EXAM: MRI HEAD WITHOUT CONTRAST TECHNIQUE: Multiplanar, multiecho pulse sequences of the brain and surrounding structures were obtained without intravenous contrast. COMPARISON:  None. FINDINGS: Motion artifact is present. Brain: Small focus of mildly reduced diffusion in the left thalamocapsular region. A punctate focus of diffusion hyperintensity and ADC isointensity is present in the right thalamus. No evidence of intracranial hemorrhage. There is no intracranial mass, mass effect, or edema. There is no hydrocephalus or extra-axial fluid collection. Prominence of the ventricles and sulci reflects parenchymal volume loss. Patchy T2 hyperintensity in the supratentorial white matter is nonspecific but may reflect mild to moderate chronic microvascular ischemic changes. There is  a small chronic infarct of the right frontal white matter. Vascular: Major vessel flow voids at the skull base are preserved. Skull and upper cervical spine: Small lesion of the right parietal calvarium along the inner table is probably benign. Sinuses/Orbits: Paranasal sinuses are aerated. Orbits are unremarkable. Other: Sella is unremarkable.  Mastoid air  cells are clear. 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 Additional punctate more subacute appearing right thalamic infarct. Mild to moderate chronic microvascular ischemic changes. 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 Electronically Signed   By: Macy Mis M.D.   On: 09/15/2021 14:26   MR BRAIN W CONTRAST  Result Date: 09/16/2021 CLINICAL DATA:  Initial evaluation for intracranial infection. EXAM: MRI HEAD WITH CONTRAST TECHNIQUE: Multiplanar, multiecho pulse sequences of the brain and surrounding structures were obtained with intravenous contrast. CONTRAST:  32mL GADAVIST GADOBUTROL 1 MMOL/ML IV SOLN COMPARISON:  Prior noncontrast brain MRI from earlier the same day. FINDINGS: Brain: Examination moderately degraded by motion artifact. Precontrast T1 weighted sequence demonstrates no definite new intracranial abnormality since previous MRI from earlier the same day. Following contrast administration, no pathologic enhancement is seen. No visible mass lesion, mass effect or midline shift. No hydrocephalus or extra-axial fluid collection. No other new intracranial abnormality. Vascular: Major intracranial vascular flow voids grossly maintained. Skull and upper cervical spine: Craniocervical junction within normal limits. Bone marrow signal intensity normal. No scalp soft tissue abnormality. Sinuses/Orbits: Globes and orbital soft tissues demonstrate no acute finding. Paranasal sinuses and mastoid air cells are grossly clear. Other: None. IMPRESSION: No pathologic enhancement or MR evidence for acute intracranial infection. Electronically Signed   By: Jeannine Boga M.D.   On: 09/16/2021 02:21   EEG adult  Result Date: 09/16/2021 Lora Havens, MD     09/16/2021  4:42 PM Patient Name: Joy Clark MRN: [id number] Epilepsy Attending: Lora Havens Referring Physician/Provider: Kerney Elbe, MD Date: 09/16/2021 Duration: 23.21 mins Patient history:  71 year  old female with AMS. 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 Exam reveals a severely encephalopathic patient who is nonverbal and not following any commands. EEG to evaluate for seizure Level of alertness: Awake AEDs during EEG study: None Technical aspects: This EEG study was done with scalp electrodes positioned according to the 10-20 International system of electrode placement. Electrical activity was acquired at a sampling rate of 500Hz  and reviewed with a high frequency filter of 70Hz  and a low frequency filter of 1Hz . EEG data were recorded continuously and digitally stored. Description: No posterior dominant rhythm was seen. EEG showed continuous generalized 3 to 6 Hz theta-delta slowing. Generalized periodic discharges with triphasic morphology at  1 Hz were also noted intermittently. Physiologic photic driving was not seen during photic stimulation.  Hyperventilation was not performed.   ABNORMALITY - Periodic discharges with triphasic morphology, generalized ( GPDs) - Continuous slow, generalized IMPRESSION: This study is suggestive of moderate to severe diffuse encephalopathy, nonspecific etiology but likely related to toxic-metabolic causes. No seizures or definite epileptiform discharges were seen throughout the recording. Priyanka Barbra Sarks        Scheduled Meds:  atorvastatin  40 mg Oral Daily   carvedilol  25 mg Oral BID WC   feeding supplement (PROSource TF)  45 mL Per Tube BID   free water  200 mL Per Tube Q4H   lisinopril  40 mg Oral Daily   methimazole  5 mg Oral Daily   pantoprazole (PROTONIX) IV  40 mg Intravenous Q24H   spironolactone  50 mg Oral Daily   Continuous Infusions:  acyclovir 485 mg (09/17/21 0311)   ampicillin (OMNIPEN) IV 2 g (09/17/21 0107)   cefTRIAXone (ROCEPHIN)  IV 2 g (09/17/21 0159)   feeding supplement (OSMOLITE 1.5 CAL) 1,000 mL (09/16/21 2351)    heparin 900 Units/hr (09/17/21 0312)   vancomycin 1,000 mg (09/16/21 2200)     LOS: 10 days    Time spent: 25  mins     Wyvonnia Dusky, MD [facility] Pager [phone]  If 7PM-7AM, please contact night-coverage 09/17/2021, 7:30 AM

## 2021-09-17 NOTE — Progress Notes (Signed)
Subjective: Per Hospitalist note, the patient was slightly improved this morning with intermittent shaking of head yes/no to questions.  Objective: Current vital signs: BP (!) 140/55 (BP Location: Right Arm)    Pulse 66    Temp 98.1 F (36.7 C) (Axillary)    Resp 20    Ht  (1.778 m)    Wt 51.1 kg    SpO2 95%    BMI 16.16 kg/m  Vital signs in last 24 hours: Temp:  [97.9 F (36.6 C)-98.2 F (36.8 C)] 98.1 F (36.7 C) (03/11 0729) Pulse Rate:  [63-70] 66 (03/11 0729) Resp:  [19-20] 20 (03/11 0729) BP: (112-140)/(51-62) 140/55 (03/11 0729) SpO2:  [94 %-98 %] 95 % (03/11 0729) Weight:  [51.1 kg] 51.1 kg (03/11 0455)  Intake/Output from previous day: 03/10 0701 - 03/11 0700 In: 2313.5 [I.V.:179; NG/GT:919.5; IV Piggyback:1215] Out: 1350 [Urine:1350] Intake/Output this shift: No intake/output data recorded. Nutritional status:  Diet Order             DIET DYS 3 Room service appropriate? Yes; Fluid consistency: Thin  Diet effective now                  HEENT: [place]/AT Lungs: Respirations unlabored. The tachypneic breathing pattern noted on Thursday is now resolved.  Ext: No edema  Neurologic Exam: Mental Status: Awake but not following commands except for one instance when asked to look at examiner's hand, which patient briefly did. Nonverbal with no attempts to communicate, except in response to noxious stimuli she will softly say "ow", furrow brow and look at examiner.  Cranial Nerves: II: Glances at examiner at times.   III,IV, VI: No ptosis. Eyes are conjugate. Will track examiner to left and right as he walks from side to side. No nystagmus.  VII: Face is symmetric.  VIII: Will glance towards examiner in response to loud calling of her name  XI: Head is midline XII: Does not protrude tongue. Motor/Sensory: BUE. Decreased tone and diffusely decreased muscle bulk. Will move arms slightly to stimuli but without antigravity movement.  BLE: Briskly withdraws to noxious  stimuli bilaterally. Decreased tone.  Diffusely decreased muscle bulk in all 4 extremities.  Does not follow any commands for motor testing.  Cerebellar/Gait: Unable to assess    Lab Results: Results for orders placed or performed during the hospital encounter of 09/07/21 (from the past 48 hour(s))  Glucose, capillary     Status: Abnormal   Collection Time: 09/15/21  8:45 AM  Result Value Ref Range   Glucose-Capillary 152 (H) 70 - 99 mg/dL    Comment: Glucose reference range applies only to samples taken after fasting for at least 8 hours.  Glucose, capillary     Status: Abnormal   Collection Time: 09/15/21 12:10 PM  Result Value Ref Range   Glucose-Capillary 140 (H) 70 - 99 mg/dL    Comment: Glucose reference range applies only to samples taken after fasting for at least 8 hours.  Glucose, capillary     Status: Abnormal   Collection Time: 09/15/21  4:53 PM  Result Value Ref Range   Glucose-Capillary 110 (H) 70 - 99 mg/dL    Comment: Glucose reference range applies only to samples taken after fasting for at least 8 hours.  Urine Culture     Status: Abnormal   Collection Time: 09/15/21  5:40 PM   Specimen: Urine, Clean Catch  Result Value Ref Range   Specimen Description      URINE, CLEAN CATCH  Performed at [facility], [address] [place], [place] [address]    Special Requests      NONE Performed at [facility], [address] [place], [place] [address]    Culture (A)     <10,000 COLONIES/mL INSIGNIFICANT GROWTH Performed at [facility], [address] [place], [place] [address]    Report Status 09/17/2021 FINAL   Glucose, capillary     Status: Abnormal   Collection Time: 09/15/21  8:44 PM  Result Value Ref Range   Glucose-Capillary 138 (H) 70 - 99 mg/dL    Comment: Glucose reference range applies only to samples taken after fasting for at least 8 hours.  Glucose, capillary     Status: Abnormal   Collection Time: 09/16/21 12:15 AM   Result Value Ref Range   Glucose-Capillary 124 (H) 70 - 99 mg/dL    Comment: Glucose reference range applies only to samples taken after fasting for at least 8 hours.  Glucose, capillary     Status: Abnormal   Collection Time: 09/16/21  4:14 AM  Result Value Ref Range   Glucose-Capillary 110 (H) 70 - 99 mg/dL    Comment: Glucose reference range applies only to samples taken after fasting for at least 8 hours.  CBC     Status: Abnormal   Collection Time: 09/16/21  4:31 AM  Result Value Ref Range   WBC 25.9 (H) 4.0 - 10.5 K/uL   RBC 4.96 3.87 - 5.11 MIL/uL   Hemoglobin 15.6 (H) 12.0 - 15.0 g/dL   HCT 44.0 (H) 10.2 - 72.5 %   MCV 96.6 80.0 - 100.0 fL   MCH 31.5 26.0 - 34.0 pg   MCHC 32.6 30.0 - 36.0 g/dL   RDW 36.6 44.0 - 34.7 %   Platelets 240 150 - 400 K/uL   nRBC 0.0 0.0 - 0.2 %    Comment: Performed at [facility], [address] [place], [place] [address]  Basic metabolic panel     Status: Abnormal   Collection Time: 09/16/21  4:31 AM  Result Value Ref Range   Sodium 140 135 - 145 mmol/L   Potassium 4.5 3.5 - 5.1 mmol/L   Chloride 106 98 - 111 mmol/L   CO2 27 22 - 32 mmol/L   Glucose, Bld 137 (H) 70 - 99 mg/dL    Comment: Glucose reference range applies only to samples taken after fasting for at least 8 hours.   BUN 36 (H) 8 - 23 mg/dL   Creatinine, Ser 6.38 0.44 - 1.00 mg/dL   Calcium 9.0 8.9 - 75.6 mg/dL   GFR, Estimated >43 >32 mL/min    Comment: (NOTE) Calculated using the CKD-EPI Creatinine Equation (2021)    Anion gap 7 5 - 15    Comment: Performed at [facility], [address] [place], [place] [address]  HIV Antibody (routine testing w rflx)     Status: None   Collection Time: 09/16/21  4:43 AM  Result Value Ref Range   HIV Screen 4th Generation wRfx Non Reactive Non Reactive    Comment: Performed at [facility], [address] [place], [place] [address]  Glucose, capillary     Status: Abnormal   Collection Time: 09/16/21  7:28  AM  Result Value Ref Range   Glucose-Capillary 137 (H) 70 - 99 mg/dL    Comment: Glucose reference range applies only to samples taken after fasting for at least 8 hours.  APTT  Status: None   Collection Time: 09/16/21  9:22 AM  Result Value Ref Range   aPTT 28 24 - 36 seconds    Comment: Performed at [facility], [address] [place], [place] [address]  Heparin level (unfractionated)     Status: Abnormal   Collection Time: 09/16/21  9:22 AM  Result Value Ref Range   Heparin Unfractionated >1.10 (H) 0.30 - 0.70 IU/mL    Comment: (NOTE) The clinical reportable range upper limit is being lowered to >1.10 to align with the FDA approved guidance for the current laboratory assay.  If heparin results are below expected values, and patient dosage has  been confirmed, suggest follow up testing of antithrombin III levels. Performed at [facility], [address] [place], [place] [address]   Glucose, capillary     Status: Abnormal   Collection Time: 09/16/21 12:00 PM  Result Value Ref Range   Glucose-Capillary 138 (H) 70 - 99 mg/dL    Comment: Glucose reference range applies only to samples taken after fasting for at least 8 hours.  Glucose, capillary     Status: Abnormal   Collection Time: 09/16/21  4:19 PM  Result Value Ref Range   Glucose-Capillary 124 (H) 70 - 99 mg/dL    Comment: Glucose reference range applies only to samples taken after fasting for at least 8 hours.  APTT     Status: Abnormal   Collection Time: 09/16/21  6:17 PM  Result Value Ref Range   aPTT 55 (H) 24 - 36 seconds    Comment:        IF BASELINE aPTT IS ELEVATED, SUGGEST PATIENT RISK ASSESSMENT BE USED TO DETERMINE APPROPRIATE ANTICOAGULANT THERAPY. Performed at [facility], [address] [place], [place] [address]   Glucose, capillary     Status: Abnormal   Collection Time: 09/16/21  7:47 PM  Result Value Ref Range   Glucose-Capillary 114 (H) 70 - 99 mg/dL     Comment: Glucose reference range applies only to samples taken after fasting for at least 8 hours.  Glucose, capillary     Status: Abnormal   Collection Time: 09/16/21 11:52 PM  Result Value Ref Range   Glucose-Capillary 103 (H) 70 - 99 mg/dL    Comment: Glucose reference range applies only to samples taken after fasting for at least 8 hours.  Glucose, capillary     Status: Abnormal   Collection Time: 09/17/21 12:23 AM  Result Value Ref Range   Glucose-Capillary 116 (H) 70 - 99 mg/dL    Comment: Glucose reference range applies only to samples taken after fasting for at least 8 hours.  Glucose, capillary     Status: Abnormal   Collection Time: 09/17/21  4:21 AM  Result Value Ref Range   Glucose-Capillary 131 (H) 70 - 99 mg/dL    Comment: Glucose reference range applies only to samples taken after fasting for at least 8 hours.  CBC     Status: Abnormal   Collection Time: 09/17/21  6:11 AM  Result Value Ref Range   WBC 28.8 (H) 4.0 - 10.5 K/uL   RBC 4.54 3.87 - 5.11 MIL/uL   Hemoglobin 14.4 12.0 - 15.0 g/dL   HCT 08.6 57.8 - 46.9 %   MCV 94.9 80.0 - 100.0 fL   MCH 31.7 26.0 - 34.0 pg   MCHC 33.4 30.0 - 36.0 g/dL   RDW 62.9 52.8 - 41.3 %   Platelets 260 150 - 400 K/uL  nRBC 0.0 0.0 - 0.2 %    Comment: Performed at [facility], [address] [place], [place] [address]  Basic metabolic panel     Status: Abnormal   Collection Time: 09/17/21  6:11 AM  Result Value Ref Range   Sodium 137 135 - 145 mmol/L   Potassium 4.5 3.5 - 5.1 mmol/L   Chloride 104 98 - 111 mmol/L   CO2 27 22 - 32 mmol/L   Glucose, Bld 126 (H) 70 - 99 mg/dL    Comment: Glucose reference range applies only to samples taken after fasting for at least 8 hours.   BUN 26 (H) 8 - 23 mg/dL   Creatinine, Ser 5.62 0.44 - 1.00 mg/dL   Calcium 8.3 (L) 8.9 - 10.3 mg/dL   GFR, Estimated >13 >08 mL/min    Comment: (NOTE) Calculated using the CKD-EPI Creatinine Equation (2021)    Anion gap 6 5 - 15    Comment:  Performed at [facility], [address] [place], [place] [address]  APTT     Status: Abnormal   Collection Time: 09/17/21  6:11 AM  Result Value Ref Range   aPTT 90 (H) 24 - 36 seconds    Comment:        IF BASELINE aPTT IS ELEVATED, SUGGEST PATIENT RISK ASSESSMENT BE USED TO DETERMINE APPROPRIATE ANTICOAGULANT THERAPY. Performed at [facility], [address] [place], [place] [address]   Heparin level (unfractionated)     Status: Abnormal   Collection Time: 09/17/21  6:11 AM  Result Value Ref Range   Heparin Unfractionated 0.95 (H) 0.30 - 0.70 IU/mL    Comment: (NOTE) The clinical reportable range upper limit is being lowered to >1.10 to align with the FDA approved guidance for the current laboratory assay.  If heparin results are below expected values, and patient dosage has  been confirmed, suggest follow up testing of antithrombin III levels. Performed at [facility], [address] [place], [place] [address]   Glucose, capillary     Status: Abnormal   Collection Time: 09/17/21  7:29 AM  Result Value Ref Range   Glucose-Capillary 131 (H) 70 - 99 mg/dL    Comment: Glucose reference range applies only to samples taken after fasting for at least 8 hours.    Recent Results (from the past 240 hour(s))  Blood culture (single)     Status: None   Collection Time: 09/07/21 11:09 AM   Specimen: BLOOD LEFT ARM  Result Value Ref Range Status   Specimen Description BLOOD LEFT ARM  Final   Special Requests   Final    BOTTLES DRAWN AEROBIC AND ANAEROBIC Blood Culture adequate volume   Culture   Final    NO GROWTH 5 DAYS Performed at [facility], [address] [place], [place] [address]    Report Status 09/12/2021 FINAL  Final  Resp Panel by RT-PCR (Flu A&B, Covid) Nasopharyngeal Swab     Status: None   Collection Time: 09/07/21 11:09 AM   Specimen: Nasopharyngeal Swab; Nasopharyngeal(NP) swabs in vial transport medium  Result  Value Ref Range Status   SARS Coronavirus 2 by RT PCR NEGATIVE NEGATIVE Final    Comment: (NOTE) SARS-CoV-2 target nucleic acids are NOT DETECTED.  The SARS-CoV-2 RNA is generally detectable in upper respiratory specimens during the acute phase of infection. The lowest concentration of SARS-CoV-2 viral copies this assay can detect is 138 copies/mL. A negative result does not preclude SARS-Cov-2 infection and  should not be used as the sole basis for treatment or other patient management decisions. A negative result may occur with  improper specimen collection/handling, submission of specimen other than nasopharyngeal swab, presence of viral mutation(s) within the areas targeted by this assay, and inadequate number of viral copies(<138 copies/mL). A negative result must be combined with clinical observations, patient history, and epidemiological information. The expected result is Negative.  Fact Sheet for Patients:  BloggerCourse.com  Fact Sheet for Healthcare Providers:  SeriousBroker.it  This test is no t yet approved or cleared by the [place] FDA and  has been authorized for detection and/or diagnosis of SARS-CoV-2 by FDA under an Emergency Use Authorization (EUA). This EUA will remain  in effect (meaning this test can be used) for the duration of the COVID-19 declaration under Section 564(b)(1) of the Act, 21 U.S.C.section 360bbb-3(b)(1), unless the authorization is terminated  or revoked sooner.       Influenza A by PCR NEGATIVE NEGATIVE Final   Influenza B by PCR NEGATIVE NEGATIVE Final    Comment: (NOTE) The Xpert Xpress SARS-CoV-2/FLU/RSV plus assay is intended as an aid in the diagnosis of influenza from Nasopharyngeal swab specimens and should not be used as a sole basis for treatment. Nasal washings and aspirates are unacceptable for Xpert Xpress SARS-CoV-2/FLU/RSV testing.  Fact Sheet for  Patients: BloggerCourse.com  Fact Sheet for Healthcare Providers: SeriousBroker.it  This test is not yet approved or cleared by the [place] FDA and has been authorized for detection and/or diagnosis of SARS-CoV-2 by FDA under an Emergency Use Authorization (EUA). This EUA will remain in effect (meaning this test can be used) for the duration of the COVID-19 declaration under Section 564(b)(1) of the Act, 21 U.S.C. section 360bbb-3(b)(1), unless the authorization is terminated or revoked.  Performed at [facility], [address] [place], [place] [address]   Urine Culture     Status: Abnormal   Collection Time: 09/07/21 11:23 AM   Specimen: Urine, Clean Catch  Result Value Ref Range Status   Specimen Description   Final    URINE, CLEAN CATCH Performed at [facility], [address], [place], [place] [address]    Special Requests   Final    NONE Performed at [facility], [address] [place], [place] [address]    Culture >=100,000 COLONIES/mL ESCHERICHIA COLI (A)  Final   Report Status 09/11/2021 FINAL  Final   Organism ID, Bacteria ESCHERICHIA COLI (A)  Final      Susceptibility   Escherichia coli - MIC*    AMPICILLIN >=32 RESISTANT Resistant     CEFAZOLIN <=4 SENSITIVE Sensitive     CEFEPIME <=0.12 SENSITIVE Sensitive     CEFTRIAXONE <=0.25 SENSITIVE Sensitive     CIPROFLOXACIN <=0.25 SENSITIVE Sensitive     GENTAMICIN <=1 SENSITIVE Sensitive     IMIPENEM <=0.25 SENSITIVE Sensitive     NITROFURANTOIN <=16 SENSITIVE Sensitive     TRIMETH/SULFA <=20 SENSITIVE Sensitive     AMPICILLIN/SULBACTAM >=32 RESISTANT Resistant     PIP/TAZO <=4 SENSITIVE Sensitive     * >=100,000 COLONIES/mL ESCHERICHIA COLI  C Difficile Quick Screen w PCR reflex     Status: None   Collection Time: 09/07/21  7:21 PM   Specimen: STOOL  Result Value Ref Range Status   C Diff antigen NEGATIVE NEGATIVE Final    C Diff toxin NEGATIVE NEGATIVE Final   C Diff interpretation No C. difficile detected.  Final    Comment: Performed  at [facility], [address] [place], [place] [address]  Gastrointestinal Panel by PCR , Stool     Status: None   Collection Time: 09/07/21  7:21 PM   Specimen: STOOL  Result Value Ref Range Status   Campylobacter species NOT DETECTED NOT DETECTED Final   Plesimonas shigelloides NOT DETECTED NOT DETECTED Final   Salmonella species NOT DETECTED NOT DETECTED Final   Yersinia enterocolitica NOT DETECTED NOT DETECTED Final   Vibrio species NOT DETECTED NOT DETECTED Final   Vibrio cholerae NOT DETECTED NOT DETECTED Final   Enteroaggregative E coli (EAEC) NOT DETECTED NOT DETECTED Final   Enteropathogenic E coli (EPEC) NOT DETECTED NOT DETECTED Final   Enterotoxigenic E coli (ETEC) NOT DETECTED NOT DETECTED Final   Shiga like toxin producing E coli (STEC) NOT DETECTED NOT DETECTED Final   Shigella/Enteroinvasive E coli (EIEC) NOT DETECTED NOT DETECTED Final   Cryptosporidium NOT DETECTED NOT DETECTED Final   Cyclospora cayetanensis NOT DETECTED NOT DETECTED Final   Entamoeba histolytica NOT DETECTED NOT DETECTED Final   Giardia lamblia NOT DETECTED NOT DETECTED Final   Adenovirus F40/41 NOT DETECTED NOT DETECTED Final   Astrovirus NOT DETECTED NOT DETECTED Final   Norovirus GI/GII NOT DETECTED NOT DETECTED Final   Rotavirus A NOT DETECTED NOT DETECTED Final   Sapovirus (I, II, IV, and V) NOT DETECTED NOT DETECTED Final    Comment: Performed at [facility], [address] [place], [place] [address]  Urine Culture     Status: Abnormal   Collection Time: 09/15/21  5:40 PM   Specimen: Urine, Clean Catch  Result Value Ref Range Status   Specimen Description   Final    URINE, CLEAN CATCH Performed at [facility], [address], [place], [place] [address]    Special Requests   Final    NONE Performed at [facility], [address], [place], [place] [address]    Culture (A)  Final    <10,000 COLONIES/mL INSIGNIFICANT GROWTH Performed at [facility], [address], [place], [place] [address]    Report Status 09/17/2021 FINAL  Final    Lipid Panel No results for input(s): CHOL, TRIG, HDL, CHOLHDL, VLDL, LDLCALC in the last 72 hours.  Studies/Results: MR BRAIN WO CONTRAST  Result Date: 09/15/2021 CLINICAL DATA:  Delirium EXAM: MRI HEAD WITHOUT CONTRAST TECHNIQUE: Multiplanar, multiecho pulse sequences of the brain and surrounding structures were obtained without intravenous contrast. COMPARISON:  None. FINDINGS: Motion artifact is present. Brain: Small focus of mildly reduced diffusion in the left thalamocapsular region. A punctate focus of diffusion hyperintensity and ADC isointensity is present in the right thalamus. No evidence of intracranial hemorrhage. There is no intracranial mass, mass effect, or edema. There is no hydrocephalus or extra-axial fluid collection. Prominence of the ventricles and sulci reflects parenchymal volume loss. Patchy T2 hyperintensity in the supratentorial white matter is nonspecific but may reflect mild to moderate chronic microvascular ischemic changes. There is a small chronic infarct of the right frontal white matter. Vascular: Major vessel flow voids at the skull base are preserved. Skull and upper cervical spine: Small lesion of the right parietal calvarium along the inner table is probably benign. Sinuses/Orbits: Paranasal sinuses are aerated. Orbits are unremarkable. Other: Sella is unremarkable.  Mastoid air cells are clear. 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 Additional punctate more subacute appearing right thalamic infarct. Mild to moderate chronic microvascular ischemic changes. Small chronic right frontal white matter  infarct. Electronically Signed   By: Guadlupe SpanishPraneil  Patel M.D.   On: 09/15/2021 14:26   MR BRAIN W CONTRAST  Result Date:  09/16/2021 CLINICAL DATA:  Initial evaluation for intracranial infection. EXAM: MRI HEAD WITH CONTRAST TECHNIQUE: Multiplanar, multiecho pulse sequences of the brain and surrounding structures were obtained with intravenous contrast. CONTRAST:  4mL GADAVIST GADOBUTROL 1 MMOL/ML IV SOLN COMPARISON:  Prior noncontrast brain MRI from earlier the same day. FINDINGS: Brain: Examination moderately degraded by motion artifact. Precontrast T1 weighted sequence demonstrates no definite new intracranial abnormality since previous MRI from earlier the same day. Following contrast administration, no pathologic enhancement is seen. No visible mass lesion, mass effect or midline shift. No hydrocephalus or extra-axial fluid collection. No other new intracranial abnormality. Vascular: Major intracranial vascular flow voids grossly maintained. Skull and upper cervical spine: Craniocervical junction within normal limits. Bone marrow signal intensity normal. No scalp soft tissue abnormality. Sinuses/Orbits: Globes and orbital soft tissues demonstrate no acute finding. Paranasal sinuses and mastoid air cells are grossly clear. Other: None. IMPRESSION: No pathologic enhancement or MR evidence for acute intracranial infection. Electronically Signed   By: Rise MuBenjamin  McClintock M.D.   On: 09/16/2021 02:21   EEG adult  Result Date: 09/16/2021 Charlsie QuestYadav, Priyanka O, MD     09/16/2021  4:42 PM Patient Name: Joy HelperMary Clark MRN: [id number] Epilepsy Attending: Charlsie QuestPriyanka O Yadav Referring Physician/Provider: Caryl PinaLindzen, Elvie Palomo, MD Date: 09/16/2021 Duration: 23.21 mins Patient history:  71 year old female with AMS. 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 Exam reveals a severely encephalopathic patient who is nonverbal and not following any commands. EEG to evaluate for seizure Level of alertness: Awake AEDs during EEG study: None Technical aspects: This EEG  study was done with scalp electrodes positioned according to the 10-20 International system of electrode placement. Electrical activity was acquired at a sampling rate of 500Hz  and reviewed with a high frequency filter of 70Hz  and a low frequency filter of 1Hz . EEG data were recorded continuously and digitally stored. Description: No posterior dominant rhythm was seen. EEG showed continuous generalized 3 to 6 Hz theta-delta slowing. Generalized periodic discharges with triphasic morphology at  1 Hz were also noted intermittently. Physiologic photic driving was not seen during photic stimulation.  Hyperventilation was not performed.   ABNORMALITY - Periodic discharges with triphasic morphology, generalized ( GPDs) - Continuous slow, generalized IMPRESSION: This study is suggestive of moderate to severe diffuse encephalopathy, nonspecific etiology but likely related to toxic-metabolic causes. No seizures or definite epileptiform discharges were seen throughout the recording. Priyanka Annabelle Harman Yadav    Medications: Scheduled:  atorvastatin  40 mg Oral Daily   carvedilol  25 mg Oral BID WC   feeding supplement (PROSource TF)  45 mL Per Tube BID   free water  200 mL Per Tube Q4H   lisinopril  40 mg Oral Daily   methimazole  5 mg Oral Daily   pantoprazole (PROTONIX) IV  40 mg Intravenous Q24H   spironolactone  50 mg Oral Daily   Continuous:  acyclovir 485 mg (09/17/21 0311)   ampicillin (OMNIPEN) IV 2 g (09/17/21 0806)   cefTRIAXone (ROCEPHIN)  IV 2 g (09/17/21 0159)   feeding supplement (OSMOLITE 1.5 CAL) 1,000 mL (09/16/21 2351)   heparin 900 Units/hr (09/17/21 [id number])   vancomycin 1,000 mg (09/16/21 2200)    Assessment: 71 year old female with AMS. MRI reveals a small acute left  internal capsule ischemic infarction as well as a punctate focus of DWI signal abnormality in the right thalamus.  1. Exam today: Awake but not answering questions or following commands except for one instance when asked to look at  examiner's hand, which patient briefly did. Otherwise no change from Thursday's exam. Per Hospitalist note, the patient was slightly improved this morning with intermittent shaking of head yes/no to questions. .  2. Brain imaging: - MRI brain without contrast: Small acute infarct in the left thalamocapsular region. Additional punctate more subacute appearing right thalamic infarct. Mild to moderate chronic microvascular ischemic changes. 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 - MRI brain with contrast: No pathologic enhancement or MR evidence for acute intracranial infection. 3. TTE shows LVEF of 60-65% with normal LV function. No mural thrombus or valvular vegetation mentioned in the report.  4. 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 EEG: This study is suggestive of moderate to severe diffuse encephalopathy, nonspecific etiology but likely related to toxic-metabolic causes. No seizures or definite epileptiform discharges were seen throughout the recording. 6. DDx:  - Encephalopathy cannot be explained by her stroke and it is too severe to be solely due to UTI given lack of improvement with ABX. An unusual etiology such as an encephalitis secondary to viral infection involving 2 or more organ systems (e.g. lung, GI tract and brain) is felt to be a significant differential diagnostic consideration.  - Subclinical seizures unlikely given no epileptiform abnormalities on EEG.  - Bickerstaff's brainstem encephalitis and cerebral vasculitis can both be associated with ulcerative colitis in rare instances affecting level of consciousness together with GI complaints and even cold symptoms.  - Cytomegalovirus colitis can be accompanied by encphalopathy.  - Seasonally, given the cold weather, would not expect an insect-borne viral infection during this time of the year.  - Lymphocytic choriomeningits virus secondary to transmission from mice could present with  her sequence of symptoms.  - Catatonia is also on the DDx 7. ID saw the patient yesterday. Recommended to discontinue vancomycin, continue ampicillin/ceftriaxone/acyclovir and obtain LP. Agrees that infectious enterocolitis may have been the initial condition precipitating admission. Regarding the patient's ongoing encephalopathy, ID note indicates that HSV encephalitis or an aseptic/viral meningitis (such as adnenovirus, which could also cause GI symptoms) are on the DDx.   8. HIV screen 4th generation wRfx: Non-reactive.   Recommendations: 1. Continue ampicillin/ceftriaxone/acyclovir. Per ID, can discontinue vancomycin - will defer to primary team for orders.  2. Lumbar puncture under fluoroscopy with sedation (will need fluoro with sedation due to agitation to moderate noxious stimuli on exam and inability to follow commands). Obtain cell count with differential, protein, glucose, cryptococcal antigen, fungal culture, fungal stain, AFB smear and culture, bacterial culture and gram stain, IgG index, as well as PCR for HSV, CMV, HIV and VZV. Save 5 to 10 cc additional fluid for possible future ID tests.  3. Continue apixaban for stroke prophylaxis in the setting of a-fib over the long term. However, in order to obtain LP, will need to hold apixaban, cover with IV heparin and then hold IV heparin for 4 hours prior to LP. Can then restart heparin 2 hours after LP. DOACs can be restarted six to eight hours after an atraumatic LP if there is no blood or xanthochromia in the CSF to suggest bleeding.  4. Labs pending:  - Anti-GQ1b antibodies (ordered as [facility] send out on 3/9) - Serum CMV PCR (ordered on 3/9) - Lyme  titer from CSF ordered (pending sample) - Lymphocytic choriomeningitis virus IgG and IgM (ordered as [facility] send out on 3/9) 5. Will consider an Ativan challenge tomorrow to assess for possible catatonia.     LOS: 10 days   @Electronically  signed: Dr. Caryl Pina 09/17/2021  8:18 AM

## 2021-09-17 NOTE — Progress Notes (Signed)
[facility]  ROUNDING NOTE   Subjective:   Patient seen resting in bed, receiving tube feeds via nasogastric tube. She appears confused, unable to participate in meaningful conversation.  Patient was consulted to Nephrology for Hypernatremia,which got resolved at this point.  Objective:  Vital signs in last 24 hours:  Temp:  [97.9 F (36.6 C)-98.2 F (36.8 C)] 97.9 F (36.6 C) (03/11 1124) Pulse Rate:  [62-70] 62 (03/11 1124) Resp:  [19-20] 20 (03/11 1124) BP: (114-140)/(51-62) 123/57 (03/11 1124) SpO2:  [94 %-98 %] 94 % (03/11 1124) Weight:  [51.1 kg] 51.1 kg (03/11 0455)  Weight change: 1.9 kg Filed Weights   09/15/21 0348 09/16/21 0549 09/17/21 0455  Weight: 48.4 kg 49.2 kg 51.1 kg    Intake/Output: I/O last 3 completed shifts: In: 3863.5 [I.V.:179; NG/GT:2019.5; IV Piggyback:1665] Out: 1350 [Urine:1350]   Intake/Output this shift:  Total I/O In: 0  Out: 100 [Urine:100]  Physical Exam: General: No acute distress  Head: Normocephalic, atraumatic. NGT in place  Eyes: Anicteric  Lungs:  Respirations even,unlabored,  O2 via [place], Expiratory wheezes +  Heart: Regular rate and rhythm  Abdomen:  Soft, nontender  Extremities: No peripheral edema.  Neurologic: Alert, confused,makes eye contact  Skin: No acute lesions or rashes  Access: None    Basic Metabolic Panel: Recent Labs  Lab 09/12/21 1304 09/12/21 1715 09/13/21 0619 09/13/21 1713 09/14/21 0620 09/15/21 0446 09/16/21 0431 09/17/21 0611  NA  --   --  140  --  144 140 140 137  K  --   --  3.6  --  4.0 4.1 4.5 4.5  CL  --   --  102  --  108 105 106 104  CO2  --   --  28  --  GLUCOSE  --   --  146*  --  154* 138* 137* 126*  BUN  --   --  25*  --  33* 36* 36* 26*  CREATININE  --   --  0.68  --  0.68 0.58 0.65 0.66  CALCIUM  --   --  8.8*  --  9.1 8.8* 9.0 8.3*  MG 1.6* 1.7 1.8 1.8  --   --   --   --   PHOS 3.1 2.9 2.8 2.6  --   --   --   --      Liver Function  Tests: Recent Labs  Lab 09/11/21 0544 09/12/21 0425 09/14/21 0620  AST ALT ALKPHOS 84 61 76  BILITOT 0.8 0.6 0.8  PROT 6.8 6.5 6.1*  ALBUMIN 2.8* 2.8* 2.8*    No results for input(s): LIPASE, AMYLASE in the last 168 hours.  No results for input(s): AMMONIA in the last 168 hours.  CBC: Recent Labs  Lab 09/13/21 0619 09/14/21 0620 09/15/21 0446 09/16/21 0431 09/17/21 0611  WBC 19.5* 22.8* 26.6* 25.9* 28.8*  HGB 17.3* 16.9* 16.0* 15.6* 14.4  HCT 50.7* 49.7* 48.2* 47.9* 43.1  MCV 92.2 92.7 94.9 96.6 94.9  PLT 195 212 218 240 260     Cardiac Enzymes: No results for input(s): CKTOTAL, CKMB, CKMBINDEX, TROPONINI in the last 168 hours.  BNP: Invalid input(s): POCBNP  CBG: Recent Labs  Lab 09/16/21 2352 09/17/21 0023 09/17/21 0421 09/17/21 0729 09/17/21 1123  GLUCAP 103* 116* 131* 131* 119*     Microbiology: Results for orders placed or performed during the hospital encounter of 09/07/21  Blood  culture (single)     Status: None   Collection Time: 09/07/21 11:09 AM   Specimen: BLOOD LEFT ARM  Result Value Ref Range Status   Specimen Description BLOOD LEFT ARM  Final   Special Requests   Final    BOTTLES DRAWN AEROBIC AND ANAEROBIC Blood Culture adequate volume   Culture   Final    NO GROWTH 5 DAYS Performed at [facility], [address] [place], [place] [address]    Report Status 09/12/2021 FINAL  Final  Resp Panel by RT-PCR (Flu A&B, Covid) Nasopharyngeal Swab     Status: None   Collection Time: 09/07/21 11:09 AM   Specimen: Nasopharyngeal Swab; Nasopharyngeal(NP) swabs in vial transport medium  Result Value Ref Range Status   SARS Coronavirus 2 by RT PCR NEGATIVE NEGATIVE Final    Comment: (NOTE) SARS-CoV-2 target nucleic acids are NOT DETECTED.  The SARS-CoV-2 RNA is generally detectable in upper respiratory specimens during the acute phase of infection. The lowest concentration of SARS-CoV-2 viral copies this assay  can detect is 138 copies/mL. A negative result does not preclude SARS-Cov-2 infection and should not be used as the sole basis for treatment or other patient management decisions. A negative result may occur with  improper specimen collection/handling, submission of specimen other than nasopharyngeal swab, presence of viral mutation(s) within the areas targeted by this assay, and inadequate number of viral copies(<138 copies/mL). A negative result must be combined with clinical observations, patient history, and epidemiological information. The expected result is Negative.  Fact Sheet for Patients:  BloggerCourse.com  Fact Sheet for Healthcare Providers:  SeriousBroker.it  This test is no t yet approved or cleared by the [place] FDA and  has been authorized for detection and/or diagnosis of SARS-CoV-2 by FDA under an Emergency Use Authorization (EUA). This EUA will remain  in effect (meaning this test can be used) for the duration of the COVID-19 declaration under Section 564(b)(1) of the Act, 21 U.S.C.section 360bbb-3(b)(1), unless the authorization is terminated  or revoked sooner.       Influenza A by PCR NEGATIVE NEGATIVE Final   Influenza B by PCR NEGATIVE NEGATIVE Final    Comment: (NOTE) The Xpert Xpress SARS-CoV-2/FLU/RSV plus assay is intended as an aid in the diagnosis of influenza from Nasopharyngeal swab specimens and should not be used as a sole basis for treatment. Nasal washings and aspirates are unacceptable for Xpert Xpress SARS-CoV-2/FLU/RSV testing.  Fact Sheet for Patients: BloggerCourse.com  Fact Sheet for Healthcare Providers: SeriousBroker.it  This test is not yet approved or cleared by the [place] FDA and has been authorized for detection and/or diagnosis of SARS-CoV-2 by FDA under an Emergency Use Authorization (EUA). This EUA will  remain in effect (meaning this test can be used) for the duration of the COVID-19 declaration under Section 564(b)(1) of the Act, 21 U.S.C. section 360bbb-3(b)(1), unless the authorization is terminated or revoked.  Performed at [facility], [address], [place], [place] [address]   Urine Culture     Status: Abnormal   Collection Time: 09/07/21 11:23 AM   Specimen: Urine, Clean Catch  Result Value Ref Range Status   Specimen Description   Final    URINE, CLEAN CATCH Performed at [facility], [address] [place], [place] [address]    Special Requests   Final    NONE Performed at [facility], [address] [place], [place] [address]    Culture >=100,000 COLONIES/mL ESCHERICHIA COLI (A)  Final   Report Status 09/11/2021 FINAL  Final   Organism ID, Bacteria ESCHERICHIA COLI (A)  Final      Susceptibility   Escherichia coli - MIC*    AMPICILLIN >=32 RESISTANT Resistant     CEFAZOLIN <=4 SENSITIVE Sensitive     CEFEPIME <=0.12 SENSITIVE Sensitive     CEFTRIAXONE <=0.25 SENSITIVE Sensitive     CIPROFLOXACIN <=0.25 SENSITIVE Sensitive     GENTAMICIN <=1 SENSITIVE Sensitive     IMIPENEM <=0.25 SENSITIVE Sensitive     NITROFURANTOIN <=16 SENSITIVE Sensitive     TRIMETH/SULFA <=20 SENSITIVE Sensitive     AMPICILLIN/SULBACTAM >=32 RESISTANT Resistant     PIP/TAZO <=4 SENSITIVE Sensitive     * >=100,000 COLONIES/mL ESCHERICHIA COLI  C Difficile Quick Screen w PCR reflex     Status: None   Collection Time: 09/07/21  7:21 PM   Specimen: STOOL  Result Value Ref Range Status   C Diff antigen NEGATIVE NEGATIVE Final   C Diff toxin NEGATIVE NEGATIVE Final   C Diff interpretation No C. difficile detected.  Final    Comment: Performed at [facility], [address] [place], [place] [address]  Gastrointestinal Panel by PCR , Stool     Status: None   Collection Time: 09/07/21  7:21 PM   Specimen: STOOL  Result Value Ref Range Status    Campylobacter species NOT DETECTED NOT DETECTED Final   Plesimonas shigelloides NOT DETECTED NOT DETECTED Final   Salmonella species NOT DETECTED NOT DETECTED Final   Yersinia enterocolitica NOT DETECTED NOT DETECTED Final   Vibrio species NOT DETECTED NOT DETECTED Final   Vibrio cholerae NOT DETECTED NOT DETECTED Final   Enteroaggregative E coli (EAEC) NOT DETECTED NOT DETECTED Final   Enteropathogenic E coli (EPEC) NOT DETECTED NOT DETECTED Final   Enterotoxigenic E coli (ETEC) NOT DETECTED NOT DETECTED Final   Shiga like toxin producing E coli (STEC) NOT DETECTED NOT DETECTED Final   Shigella/Enteroinvasive E coli (EIEC) NOT DETECTED NOT DETECTED Final   Cryptosporidium NOT DETECTED NOT DETECTED Final   Cyclospora cayetanensis NOT DETECTED NOT DETECTED Final   Entamoeba histolytica NOT DETECTED NOT DETECTED Final   Giardia lamblia NOT DETECTED NOT DETECTED Final   Adenovirus F40/41 NOT DETECTED NOT DETECTED Final   Astrovirus NOT DETECTED NOT DETECTED Final   Norovirus GI/GII NOT DETECTED NOT DETECTED Final   Rotavirus A NOT DETECTED NOT DETECTED Final   Sapovirus (I, II, IV, and V) NOT DETECTED NOT DETECTED Final    Comment: Performed at [facility], [address] [place], [place] [address]  Urine Culture     Status: Abnormal   Collection Time: 09/15/21  5:40 PM   Specimen: Urine, Clean Catch  Result Value Ref Range Status   Specimen Description   Final    URINE, CLEAN CATCH Performed at [facility], [address] [place], [place] [address]    Special Requests   Final    NONE Performed at [facility], [address] [place], [place] [address]    Culture (A)  Final    <10,000 COLONIES/mL INSIGNIFICANT GROWTH Performed at [facility], [address] [place], [place] [address]    Report Status 09/17/2021 FINAL  Final    Coagulation Studies: No results for input(s): LABPROT, INR in the last 72 hours.   Urinalysis: No results  for input(s): COLORURINE, LABSPEC, PHURINE, GLUCOSEU, HGBUR, BILIRUBINUR, KETONESUR, PROTEINUR, UROBILINOGEN, NITRITE, LEUKOCYTESUR in the last 72 hours.  Invalid input(s): APPERANCEUR    Imaging: MR BRAIN WO CONTRAST  Result Date: 09/15/2021 CLINICAL DATA:  Delirium EXAM: MRI HEAD WITHOUT CONTRAST TECHNIQUE: Multiplanar, multiecho pulse sequences of the brain and surrounding structures were obtained without intravenous contrast. COMPARISON:  None. FINDINGS: Motion artifact is present. Brain: Small focus of mildly reduced diffusion in the left thalamocapsular region. A punctate focus of diffusion hyperintensity and ADC isointensity is present in the right thalamus. No evidence of intracranial hemorrhage. There is no intracranial mass, mass effect, or edema. There is no hydrocephalus or extra-axial fluid collection. Prominence of the ventricles and sulci reflects parenchymal volume loss. Patchy T2 hyperintensity in the supratentorial white matter is nonspecific but may reflect mild to moderate chronic microvascular ischemic changes. There is a small chronic infarct of the right frontal white matter. Vascular: Major vessel flow voids at the skull base are preserved. Skull and upper cervical spine: Small lesion of the right parietal calvarium along the inner table is probably benign. Sinuses/Orbits: Paranasal sinuses are aerated. Orbits are unremarkable. Other: Sella is unremarkable.  Mastoid air cells are clear. 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 Additional punctate more subacute appearing right thalamic infarct. Mild to moderate chronic microvascular ischemic changes. 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 Electronically Signed   By: Guadlupe Spanish M.D.   On: 09/15/2021 14:26   MR BRAIN W CONTRAST  Result Date: 09/16/2021 CLINICAL DATA:  Initial evaluation for intracranial infection. EXAM: MRI HEAD WITH CONTRAST TECHNIQUE: Multiplanar, multiecho pulse sequences  of the brain and surrounding structures were obtained with intravenous contrast. CONTRAST:  42mL GADAVIST GADOBUTROL 1 MMOL/ML IV SOLN COMPARISON:  Prior noncontrast brain MRI from earlier the same day. FINDINGS: Brain: Examination moderately degraded by motion artifact. Precontrast T1 weighted sequence demonstrates no definite new intracranial abnormality since previous MRI from earlier the same day. Following contrast administration, no pathologic enhancement is seen. No visible mass lesion, mass effect or midline shift. No hydrocephalus or extra-axial fluid collection. No other new intracranial abnormality. Vascular: Major intracranial vascular flow voids grossly maintained. Skull and upper cervical spine: Craniocervical junction within normal limits. Bone marrow signal intensity normal. No scalp soft tissue abnormality. Sinuses/Orbits: Globes and orbital soft tissues demonstrate no acute finding. Paranasal sinuses and mastoid air cells are grossly clear. Other: None. IMPRESSION: No pathologic enhancement or MR evidence for acute intracranial infection. Electronically Signed   By: Rise Mu M.D.   On: 09/16/2021 02:21   EEG adult  Result Date: 09/16/2021 Charlsie Quest, MD     09/16/2021  4:42 PM Patient Name: Joy Clark MRN: [id number] Epilepsy Attending: Charlsie Quest Referring Physician/Provider: Caryl Pina, MD Date: 09/16/2021 Duration: 23.21 mins Patient history:  71 year old female with AMS. 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 Exam reveals a severely encephalopathic patient who is nonverbal and not following any commands. EEG to evaluate for seizure Level of alertness: Awake AEDs during EEG study: None Technical aspects: This EEG study was done with scalp electrodes positioned according to the 10-20 International system of electrode placement. Electrical activity was acquired at  a sampling rate of 500Hz  and reviewed with a high frequency filter of 70Hz  and a low frequency filter of 1Hz . EEG data were recorded continuously and digitally stored. Description: No posterior dominant rhythm was seen. EEG showed continuous generalized 3 to 6 Hz theta-delta slowing. Generalized periodic discharges with triphasic morphology  at  1 Hz were also noted intermittently. Physiologic photic driving was not seen during photic stimulation.  Hyperventilation was not performed.   ABNORMALITY - Periodic discharges with triphasic morphology, generalized ( GPDs) - Continuous slow, generalized IMPRESSION: This study is suggestive of moderate to severe diffuse encephalopathy, nonspecific etiology but likely related to toxic-metabolic causes. No seizures or definite epileptiform discharges were seen throughout the recording. Priyanka Annabelle Harman     Medications:    acyclovir 500 mg (09/17/21 1335)   ampicillin (OMNIPEN) IV 2 g (09/17/21 1108)   cefTRIAXone (ROCEPHIN)  IV 2 g (09/17/21 0159)   feeding supplement (OSMOLITE 1.5 CAL) 1,000 mL (09/16/21 2351)   heparin 900 Units/hr (09/17/21 1300)    atorvastatin  40 mg Oral Daily   carvedilol  25 mg Oral BID WC   feeding supplement (PROSource TF)  45 mL Per Tube BID   free water  200 mL Per Tube Q4H   lisinopril  40 mg Oral Daily   methimazole  5 mg Oral Daily   pantoprazole (PROTONIX) IV  40 mg Intravenous Q24H   spironolactone  50 mg Oral Daily   albuterol, ondansetron **OR** ondansetron (ZOFRAN) IV, oxyCODONE-acetaminophen  Assessment/ Plan:  Ms. Adalene Gulotta is a 71 y.o.  female a PMH significant for nicotine dependence, chronic pain syndrome, HLD who was brought to the ER with chief complaint of altered mental status.  Hypernatremia- secondary to GI losses, insensible losses, tachypnea and inability to ask for water as patient has altered mental status.     Na+ 137 today Continue every 4 hour flushes with tube feeds.   2.  Hypertension   - Currently receiving carvedilol 25 mg twice daily, lisinopril 40 mg daily and spironolactone 50 mg daily.  Blood pressure within acceptable range   3.  Polycythemia   Hemoglobin has ranged from 16.2-17.6 this admission.   Patient has history of heavy tobacco use, 2 packs per day.  Lab Results  Component Value Date   HGB 14.4 09/17/2021     4.  Chronic diastolic heart failure echo completed on 09/09/2021 shows EF 60 to 65% with a grade 1 diastolic dysfunction.      5.  Urinary tract infection. completed IV antibiotic therapy - E Coli in urine culture on 09/08/21  6. Acute CVA MRI on 09/15/21 shows small acute infarct in the left thalamocapsular region and punctate more subacute in right thalamic infarct.  Nephrology will sign off as hypernatremia got resolved at this point, appreciate the opportunity given to participate in this patient's care. Please contact Nephrology if any further questions.    LOS: 10 Prophet Renwick 3/11/20232:03 PM

## 2021-09-17 NOTE — Progress Notes (Signed)
ANTICOAGULATION CONSULT NOTE  ? ?Pharmacy Consult for IV heparin ?Indication: atrial fibrillation ? ?Not on File ? ?Patient Measurements: ?Height: 5\' 10"  (177.8 cm) ?Weight: 51.1 kg (112 lb 10.5 oz) ?IBW/kg (Calculated) : 68.5 ?Heparin Dosing Weight: 49.2 kg ? ?Vital Signs: ?Temp: 97.9 ?F (36.6 ?C) (03/11 1124) ?Temp Source: Axillary (03/11 1124) ?BP: 123/57 (03/11 1124) ?Pulse Rate: 62 (03/11 1124) ? ?Labs: ?Recent Labs  ?  09/15/21 ?0446 09/16/21 ?0431 09/16/21 ?0922 09/16/21 ?1817 09/17/21 ?[id number] 09/17/21 ?1339 09/17/21 ?1546  ?HGB 16.0* 15.6*  --   --  14.4  --   --   ?HCT 48.2* 47.9*  --   --  43.1  --   --   ?PLT 218 240  --   --  260  --   --   ?APTT  --   --  28   < > 90* >160* 88*  ?HEPARINUNFRC  --   --  >1.10*  --  0.95*  --   --   ?CREATININE 0.58 0.65  --   --  0.66  --   --   ? < > = values in this interval not displayed.  ? ? ? ?Estimated Creatinine Clearance: 52 mL/min (by C-G formula based on SCr of 0.66 mg/dL). ? ? ?Medical History: ?Past Medical History:  ?Diagnosis Date  ? Fibromyalgia   ? ? ?Medications:  ?Medications Prior to Admission  ?Medication Sig Dispense Refill Last Dose  ? albuterol (VENTOLIN HFA) 108 (90 Base) MCG/ACT inhaler Inhale into the lungs every 6 (six) hours as needed for wheezing or shortness of breath.   Past Week at unknown prn  ? atorvastatin (LIPITOR) 40 MG tablet Take 40 mg by mouth daily.   Past Week  ? carboxymethylcellulose 1 % ophthalmic solution 1 drop 3 (three) times daily.   Past Week  ? celecoxib (CELEBREX) 200 MG capsule Take 200 mg by mouth 2 (two) times daily.   Past Week  ? cilostazol (PLETAL) 100 MG tablet Take 100 mg by mouth 2 (two) times daily.   Past Week  ? fluticasone (FLONASE) 50 MCG/ACT nasal spray Place into both nostrils daily.   Past Week at unknown prn  ? gabapentin (NEURONTIN) 800 MG tablet Take 800 mg by mouth 3 (three) times daily.   Past Week  ? omeprazole (PRILOSEC) 40 MG capsule Take 40 mg by mouth daily.   Past Week  ? ondansetron (ZOFRAN)  8 MG tablet Take by mouth every 8 (eight) hours as needed for nausea or vomiting.   Past Week at unknown prn  ? oxyCODONE-acetaminophen (PERCOCET) 10-325 MG tablet Take 1 tablet by mouth every 4 (four) hours as needed for pain.   Past Week  ? sodium chloride (OCEAN) 0.65 % SOLN nasal spray Place 1 spray into both nostrils as needed for congestion.   Past Week at unknown prn  ? ?Scheduled:  ? atorvastatin  40 mg Oral Daily  ? carvedilol  25 mg Oral BID WC  ? feeding supplement (PROSource TF)  45 mL Per Tube BID  ? free water  200 mL Per Tube Q4H  ? lisinopril  40 mg Oral Daily  ? methimazole  5 mg Oral Daily  ? pantoprazole (PROTONIX) IV  40 mg Intravenous Q24H  ? spironolactone  50 mg Oral Daily  ? ?Infusions:  ? acyclovir 500 mg (09/17/21 1335)  ? ampicillin (OMNIPEN) IV 2 g (09/17/21 1600)  ? cefTRIAXone (ROCEPHIN)  IV 2 g (09/17/21 1440)  ? feeding supplement (  OSMOLITE 1.5 CAL) 1,000 mL (09/16/21 2351)  ? heparin 900 Units/hr (09/17/21 1300)  ? ?PRN: albuterol, ondansetron **OR** ondansetron (ZOFRAN) IV, oxyCODONE-acetaminophen ?Anti-infectives (From admission, onward)  ? ? Start     Dose/Rate Route Frequency Ordered Stop  ? 09/17/21 2200  acyclovir (ZOVIRAX) 500 mg in dextrose 5 % 100 mL IVPB  Status:  Discontinued       ? 500 mg ?110 mL/hr over 60 Minutes Intravenous Every 8 hours 09/17/21 1038 09/17/21 1050  ? 09/17/21 1400  acyclovir (ZOVIRAX) 500 mg in dextrose 5 % 100 mL IVPB       ? 500 mg ?110 mL/hr over 60 Minutes Intravenous Every 8 hours 09/17/21 1050    ? 09/17/21 1200  ampicillin (OMNIPEN) 2 g in sodium chloride 0.9 % 100 mL IVPB       ? 2 g ?300 mL/hr over 20 Minutes Intravenous Every 4 hours 09/17/21 1036    ? 09/16/21 2300  vancomycin (VANCOCIN) IVPB 1000 mg/200 mL premix  Status:  Discontinued       ?See Hyperspace for full Linked Orders Report.  ? 1,000 mg ?200 mL/hr over 60 Minutes Intravenous Every 24 hours 09/15/21 2157 09/17/21 1027  ? 09/15/21 2300  acyclovir (ZOVIRAX) 485 mg in dextrose 5  % 100 mL IVPB  Status:  Discontinued       ? 10 mg/kg ? 48.4 kg ?109.7 mL/hr over 60 Minutes Intravenous Every 12 hours 09/15/21 2146 09/17/21 1038  ? 09/15/21 2300  cefTRIAXone (ROCEPHIN) 2 g in sodium chloride 0.9 % 100 mL IVPB       ? 2 g ?200 mL/hr over 30 Minutes Intravenous Every 12 hours 09/15/21 2146    ? 09/15/21 2300  ampicillin (OMNIPEN) 2 g in sodium chloride 0.9 % 100 mL IVPB  Status:  Discontinued       ? 2 g ?300 mL/hr over 20 Minutes Intravenous Every 6 hours 09/15/21 2206 09/17/21 1036  ? 09/15/21 2245  vancomycin (VANCOREADY) IVPB 1250 mg/250 mL       ?See Hyperspace for full Linked Orders Report.  ? 1,250 mg ?166.7 mL/hr over 90 Minutes Intravenous  Once 09/15/21 2157 09/16/21 1911  ? 09/08/21 1200  cefTRIAXone (ROCEPHIN) 2 g in sodium chloride 0.9 % 100 mL IVPB       ? 2 g ?200 mL/hr over 30 Minutes Intravenous Every 24 hours 09/07/21 1506 09/13/21 1447  ? 09/07/21 2200  metroNIDAZOLE (FLAGYL) IVPB 500 mg  Status:  Discontinued       ? 500 mg ?100 mL/hr over 60 Minutes Intravenous Every 12 hours 09/07/21 1506 09/08/21 1407  ? 09/07/21 1445  metroNIDAZOLE (FLAGYL) IVPB 500 mg       ? 500 mg ?100 mL/hr over 60 Minutes Intravenous  Once 09/07/21 1435 09/07/21 1545  ? 09/07/21 1230  cefTRIAXone (ROCEPHIN) 1 g in sodium chloride 0.9 % 100 mL IVPB       ? 1 g ?200 mL/hr over 30 Minutes Intravenous  Once 09/07/21 1217 09/07/21 1320  ? 09/07/21 1230  azithromycin (ZITHROMAX) 500 mg in sodium chloride 0.9 % 250 mL IVPB       ? 500 mg ?250 mL/hr over 60 Minutes Intravenous  Once 09/07/21 1217 09/07/21 1424  ? ?  ? ? ?Assessment: ?70YOF PMH fibromyalgia, nicotine dependence, chronic pain syndrome and dyslipidemia who presented with AMS over past 4 days. Encephalopathic, now with new c/f meningitis/encephalitis. ? ?Switching to heparin in order to obtain LP. Planning to hold IV  heparin x 4 hours prior to LP and restart 2 hours after LP. ? ?CBC stable (Hgb 15.6, Ptls 240). ? ?Date Time HL/aPTT Rate/Comment   ?3/10 1817 aPTT 55  Subthera 750 > 900 units/hr ?3/11 0611 aPTT 90/HL 0.95   aPTT therapeutic ?3/11 1339 aPTT >160 Supratherapeutic. Error when drawing. Repeat level ordered ?3/11 1546 aPTT 88 Therapeutic ? ?Goal of Therapy:  ?Heparin level 0.3-0.7 units/ml ?aPTT 66-102 seconds ?Monitor platelets by anticoagulation protocol: Yes ?  ?Plan:  ?aPTT therapeutic x 2. Continue heparin infusion at 900 units/hr ?Recheck aPTT/HL with AM labs. Continue to adjust based on aPTT until correlating with heparin level.  ?Daily CBC ? ?Sherilyn Banker, PharmD ?Clinical Pharmacist  ?09/17/2021 4:20 PM  ? ? ? ? ?

## 2021-09-17 NOTE — Progress Notes (Signed)
ANTICOAGULATION CONSULT NOTE  ? ?Pharmacy Consult for IV heparin ?Indication: atrial fibrillation ? ?Not on File ? ?Patient Measurements: ?Height: 5\' 10"  (177.8 cm) ?Weight: 51.1 kg (112 lb 10.5 oz) ?IBW/kg (Calculated) : 68.5 ?Heparin Dosing Weight: 49.2 kg ? ?Vital Signs: ?Temp: 98.2 ?F (36.8 ?C) (03/11 0400) ?Temp Source: Axillary (03/11 0400) ?BP: 137/59 (03/11 0400) ?Pulse Rate: 70 (03/11 0400) ? ?Labs: ?Recent Labs  ?  09/15/21 ?0446 09/16/21 ?0431 09/16/21 ?0922 09/16/21 ?1817 09/17/21 ?11/17/21  ?HGB 16.0* 15.6*  --   --  14.4  ?HCT 48.2* 47.9*  --   --  43.1  ?PLT 218 240  --   --  260  ?APTT  --   --  28 55* 90*  ?HEPARINUNFRC  --   --  >1.10*  --  0.95*  ?CREATININE 0.58 0.65  --   --  0.66  ? ? ? ?Estimated Creatinine Clearance: 52 mL/min (by C-G formula based on SCr of 0.66 mg/dL). ? ? ?Medical History: ?Past Medical History:  ?Diagnosis Date  ? Fibromyalgia   ? ? ?Medications:  ?Medications Prior to Admission  ?Medication Sig Dispense Refill Last Dose  ? albuterol (VENTOLIN HFA) 108 (90 Base) MCG/ACT inhaler Inhale into the lungs every 6 (six) hours as needed for wheezing or shortness of breath.   Past Week at unknown prn  ? atorvastatin (LIPITOR) 40 MG tablet Take 40 mg by mouth daily.   Past Week  ? carboxymethylcellulose 1 % ophthalmic solution 1 drop 3 (three) times daily.   Past Week  ? celecoxib (CELEBREX) 200 MG capsule Take 200 mg by mouth 2 (two) times daily.   Past Week  ? cilostazol (PLETAL) 100 MG tablet Take 100 mg by mouth 2 (two) times daily.   Past Week  ? fluticasone (FLONASE) 50 MCG/ACT nasal spray Place into both nostrils daily.   Past Week at unknown prn  ? gabapentin (NEURONTIN) 800 MG tablet Take 800 mg by mouth 3 (three) times daily.   Past Week  ? omeprazole (PRILOSEC) 40 MG capsule Take 40 mg by mouth daily.   Past Week  ? ondansetron (ZOFRAN) 8 MG tablet Take by mouth every 8 (eight) hours as needed for nausea or vomiting.   Past Week at unknown prn  ? oxyCODONE-acetaminophen  (PERCOCET) 10-325 MG tablet Take 1 tablet by mouth every 4 (four) hours as needed for pain.   Past Week  ? sodium chloride (OCEAN) 0.65 % SOLN nasal spray Place 1 spray into both nostrils as needed for congestion.   Past Week at unknown prn  ? ?Scheduled:  ? atorvastatin  40 mg Oral Daily  ? carvedilol  25 mg Oral BID WC  ? feeding supplement (PROSource TF)  45 mL Per Tube BID  ? free water  200 mL Per Tube Q4H  ? lisinopril  40 mg Oral Daily  ? methimazole  5 mg Oral Daily  ? pantoprazole (PROTONIX) IV  40 mg Intravenous Q24H  ? spironolactone  50 mg Oral Daily  ? ?Infusions:  ? acyclovir 485 mg (09/17/21 0311)  ? ampicillin (OMNIPEN) IV 2 g (09/17/21 0107)  ? cefTRIAXone (ROCEPHIN)  IV 2 g (09/17/21 0159)  ? feeding supplement (OSMOLITE 1.5 CAL) 1,000 mL (09/16/21 2351)  ? heparin 900 Units/hr (09/17/21 11/17/21)  ? vancomycin 1,000 mg (09/16/21 2200)  ? ?PRN: albuterol, ondansetron **OR** ondansetron (ZOFRAN) IV, oxyCODONE-acetaminophen ?Anti-infectives (From admission, onward)  ? ? Start     Dose/Rate Route Frequency Ordered Stop  ?  09/16/21 2300  vancomycin (VANCOCIN) IVPB 1000 mg/200 mL premix       ?See Hyperspace for full Linked Orders Report.  ? 1,000 mg ?200 mL/hr over 60 Minutes Intravenous Every 24 hours 09/15/21 2157    ? 09/15/21 2300  acyclovir (ZOVIRAX) 485 mg in dextrose 5 % 100 mL IVPB       ? 10 mg/kg ? 48.4 kg ?109.7 mL/hr over 60 Minutes Intravenous Every 12 hours 09/15/21 2146    ? 09/15/21 2300  cefTRIAXone (ROCEPHIN) 2 g in sodium chloride 0.9 % 100 mL IVPB       ? 2 g ?200 mL/hr over 30 Minutes Intravenous Every 12 hours 09/15/21 2146    ? 09/15/21 2300  ampicillin (OMNIPEN) 2 g in sodium chloride 0.9 % 100 mL IVPB       ? 2 g ?300 mL/hr over 20 Minutes Intravenous Every 6 hours 09/15/21 2206    ? 09/15/21 2245  vancomycin (VANCOREADY) IVPB 1250 mg/250 mL       ?See Hyperspace for full Linked Orders Report.  ? 1,250 mg ?166.7 mL/hr over 90 Minutes Intravenous  Once 09/15/21 2157 09/16/21 1911  ?  09/08/21 1200  cefTRIAXone (ROCEPHIN) 2 g in sodium chloride 0.9 % 100 mL IVPB       ? 2 g ?200 mL/hr over 30 Minutes Intravenous Every 24 hours 09/07/21 1506 09/13/21 1447  ? 09/07/21 2200  metroNIDAZOLE (FLAGYL) IVPB 500 mg  Status:  Discontinued       ? 500 mg ?100 mL/hr over 60 Minutes Intravenous Every 12 hours 09/07/21 1506 09/08/21 1407  ? 09/07/21 1445  metroNIDAZOLE (FLAGYL) IVPB 500 mg       ? 500 mg ?100 mL/hr over 60 Minutes Intravenous  Once 09/07/21 1435 09/07/21 1545  ? 09/07/21 1230  cefTRIAXone (ROCEPHIN) 1 g in sodium chloride 0.9 % 100 mL IVPB       ? 1 g ?200 mL/hr over 30 Minutes Intravenous  Once 09/07/21 1217 09/07/21 1320  ? 09/07/21 1230  azithromycin (ZITHROMAX) 500 mg in sodium chloride 0.9 % 250 mL IVPB       ? 500 mg ?250 mL/hr over 60 Minutes Intravenous  Once 09/07/21 1217 09/07/21 1424  ? ?  ? ? ?Assessment: ?70YOF PMH fibromyalgia, nicotine dependence, chronic pain syndrome and dyslipidemia who presented with AMS over past 4 days. Encephalopathic, now with new c/f meningitis/encephalitis. ? ?Switching to heparin in order to obtain LP. Planning to hold IV heparin x 4 hours prior to LP and restart 2 hours after LP. ? ?CBC stable (Hgb 15.6, Ptls 240). ? ?Date Time HL/aPTT Rate/Comment  ?3/10 1817 aPTT 55  Subthera 750 > 900 units/hr ?3/11 0611 aPTT 90/HL 0.95   aPTT therapeutic ? ?Goal of Therapy:  ?Heparin level 0.3-0.7 units/ml ?aPTT 66-102 seconds ?Monitor platelets by anticoagulation protocol: Yes ?  ?Plan:  ?aPTT therapeutic x 1 ?Continue heparin infusion at 900 units/hr ?Recheck aPTT/HL in 8 hours to confirm ?Daily CBC ? ? ?Otelia Sergeant, PharmD, MBA ?09/17/2021 ?6:49 AM ? ? ? ?

## 2021-09-18 DIAGNOSIS — E43 Unspecified severe protein-calorie malnutrition: Secondary | ICD-10-CM | POA: Diagnosis not present

## 2021-09-18 DIAGNOSIS — R4182 Altered mental status, unspecified: Secondary | ICD-10-CM | POA: Diagnosis not present

## 2021-09-18 DIAGNOSIS — G9341 Metabolic encephalopathy: Secondary | ICD-10-CM | POA: Diagnosis not present

## 2021-09-18 DIAGNOSIS — F061 Catatonic disorder due to known physiological condition: Secondary | ICD-10-CM

## 2021-09-18 DIAGNOSIS — I639 Cerebral infarction, unspecified: Secondary | ICD-10-CM | POA: Diagnosis not present

## 2021-09-18 LAB — BASIC METABOLIC PANEL
Anion gap: 5 (ref 5–15)
BUN: 21 mg/dL (ref 8–23)
CO2: 27 mmol/L (ref 22–32)
Calcium: 8 mg/dL — ABNORMAL LOW (ref 8.9–10.3)
Chloride: 101 mmol/L (ref 98–111)
Creatinine, Ser: 0.58 mg/dL (ref 0.44–1.00)
GFR, Estimated: >60 mL/min (ref >60)
Glucose, Bld: 126 mg/dL — ABNORMAL HIGH (ref 70–99)
Potassium: 4.1 mmol/L (ref 3.5–5.1)
Sodium: 133 mmol/L — ABNORMAL LOW (ref 135–145)

## 2021-09-18 LAB — CBC
HCT: 35.8 % — ABNORMAL LOW (ref 36.0–46.0)
Hemoglobin: 12.2 g/dL (ref 12.0–15.0)
MCH: 31.9 pg (ref 26.0–34.0)
MCHC: 34.1 g/dL (ref 30.0–36.0)
MCV: 93.7 fL (ref 80.0–100.0)
Platelets: 236 10*3/uL (ref 150–400)
RBC: 3.82 MIL/uL — ABNORMAL LOW (ref 3.87–5.11)
RDW: 12.4 % (ref 11.5–15.5)
WBC: 27.9 10*3/uL — ABNORMAL HIGH (ref 4.0–10.5)
nRBC: 0 % (ref 0.0–0.2)

## 2021-09-18 LAB — APTT: aPTT: 98 seconds — ABNORMAL HIGH (ref 24–36)

## 2021-09-18 LAB — GLUCOSE, CAPILLARY
Glucose-Capillary: 115 mg/dL — ABNORMAL HIGH (ref 70–99)
Glucose-Capillary: 119 mg/dL — ABNORMAL HIGH (ref 70–99)
Glucose-Capillary: 143 mg/dL — ABNORMAL HIGH (ref 70–99)
Glucose-Capillary: 146 mg/dL — ABNORMAL HIGH (ref 70–99)
Glucose-Capillary: 146 mg/dL — ABNORMAL HIGH (ref 70–99)
Glucose-Capillary: 87 mg/dL (ref 70–99)

## 2021-09-18 LAB — HEPARIN LEVEL (UNFRACTIONATED): Heparin Unfractionated: 0.63 IU/mL (ref 0.30–0.70)

## 2021-09-18 MED ORDER — CARVEDILOL 25 MG PO TABS
25.0000 mg | ORAL_TABLET | Freq: Two times a day (BID) | ORAL | Status: DC
  Administered 2021-09-19: 25 mg
  Filled 2021-09-18 (×2): qty 1

## 2021-09-18 MED ORDER — SPIRONOLACTONE 25 MG PO TABS
50.0000 mg | ORAL_TABLET | Freq: Every day | ORAL | Status: DC
  Administered 2021-09-19 – 2021-09-24 (×6): 50 mg
  Filled 2021-09-18 (×7): qty 2

## 2021-09-18 MED ORDER — LISINOPRIL 20 MG PO TABS
40.0000 mg | ORAL_TABLET | Freq: Every day | ORAL | Status: DC
  Administered 2021-09-19 – 2021-09-24 (×6): 40 mg
  Filled 2021-09-18 (×7): qty 2

## 2021-09-18 MED ORDER — METHIMAZOLE 5 MG PO TABS
5.0000 mg | ORAL_TABLET | Freq: Every day | ORAL | Status: DC
  Administered 2021-09-19 – 2021-09-24 (×6): 5 mg
  Filled 2021-09-18 (×6): qty 1

## 2021-09-18 MED ORDER — SODIUM CHLORIDE 0.9 % IV SOLN: INTRAVENOUS | Status: DC | PRN

## 2021-09-18 MED ORDER — OXYCODONE-ACETAMINOPHEN 5-325 MG PO TABS
1.0000 | ORAL_TABLET | Freq: Four times a day (QID) | ORAL | Status: DC | PRN
  Administered 2021-09-18 – 2021-09-23 (×11): 1
  Filled 2021-09-18 (×11): qty 1

## 2021-09-18 MED ORDER — ATORVASTATIN CALCIUM 20 MG PO TABS
40.0000 mg | ORAL_TABLET | Freq: Every day | ORAL | Status: DC
  Administered 2021-09-19 – 2021-09-24 (×6): 40 mg
  Filled 2021-09-18 (×6): qty 2

## 2021-09-18 NOTE — Progress Notes (Signed)
Subjective: Awake, lying in bed comfortably.   Objective: Current vital signs: BP (!) 94/42 (BP Location: Left Arm)    Pulse 62    Temp 99.1 F (37.3 C)    Resp 18    Ht 5\' 10"  (1.778 m)    Wt 51.1 kg    SpO2 96%    BMI 16.16 kg/m  Vital signs in last 24 hours: Temp:  [98.1 F (36.7 C)-99.1 F (37.3 C)] 99.1 F (37.3 C) (03/12 1209) Pulse Rate:  [55-72] 62 (03/12 1209) Resp:  [17-26] 18 (03/12 1209) BP: (94-138)/(41-58) 94/42 (03/12 1209) SpO2:  [95 %-98 %] 96 % (03/12 1209) Weight:  [51.1 kg] 51.1 kg (03/12 0500)  Intake/Output from previous day: 03/11 0701 - 03/12 0700 In: 0  Out: 1025 [Urine:1025] Intake/Output this shift: No intake/output data recorded. Nutritional status:  Diet Order             DIET DYS 3 Room service appropriate? Yes; Fluid consistency: Thin  Diet effective now                   HEENT: [place]/AT Lungs: Respirations unlabored. The mildly tachypneic breathing pattern noted on Thursday, which was absent yesterday, is present again.  Ext: No edema   Neurologic Exam: Mental Status: Awake and not following commands today, but did answer "yes" to 2 questions. Her facial expression is slightly more animated today, whereas yesterday it was completely flat at baseline and only with brow furrowing to noxious. Slightly more attentive than yesterday: Will track examiner more briskly today than yesterday.  Cranial Nerves: II: Glances at examiner at times; does so more purposefully than yesterday.   III,IV, VI: No ptosis. Eyes are conjugate. Will track examiner to left and right as he walks from side to side. No nystagmus.  VII: Face is symmetric.  VIII: Will glance towards examiner in response to loud calling of her name  XI: Head is midline XII: Does not protrude tongue. Motor/Sensory: BUE. Will move arms slightly to stimuli and did have some antigravity movement today, which is an improvement since yesterday.   BLE: Purposefully moving BLE today. Moves in  response to noxious stimuli bilaterally.   Diffusely decreased muscle bulk in all 4 extremities. Decreased tone in BUE.  Does not follow any commands for motor testing.  Cerebellar/Gait: Unable to assess  Lab Results: Results for orders placed or performed during the hospital encounter of 09/07/21 (from the past 48 hour(s))  Glucose, capillary     Status: Abnormal   Collection Time: 09/16/21  4:19 PM  Result Value Ref Range   Glucose-Capillary 124 (H) 70 - 99 mg/dL    Comment: Glucose reference range applies only to samples taken after fasting for at least 8 hours.  APTT     Status: Abnormal   Collection Time: 09/16/21  6:17 PM  Result Value Ref Range   aPTT 55 (H) 24 - 36 seconds    Comment:        IF BASELINE aPTT IS ELEVATED, SUGGEST PATIENT RISK ASSESSMENT BE USED TO DETERMINE APPROPRIATE ANTICOAGULANT THERAPY. Performed at [facility], [address] [place], [place] [id number]   Glucose, capillary     Status: Abnormal   Collection Time: 09/16/21  7:47 PM  Result Value Ref Range   Glucose-Capillary 114 (H) 70 - 99 mg/dL    Comment: Glucose reference range applies only to samples taken after fasting for at least 8 hours.  Glucose, capillary  Status: Abnormal   Collection Time: 09/16/21 11:52 PM  Result Value Ref Range   Glucose-Capillary 103 (H) 70 - 99 mg/dL    Comment: Glucose reference range applies only to samples taken after fasting for at least 8 hours.  Glucose, capillary     Status: Abnormal   Collection Time: 09/17/21 12:23 AM  Result Value Ref Range   Glucose-Capillary 116 (H) 70 - 99 mg/dL    Comment: Glucose reference range applies only to samples taken after fasting for at least 8 hours.  Glucose, capillary     Status: Abnormal   Collection Time: 09/17/21  4:21 AM  Result Value Ref Range   Glucose-Capillary 131 (H) 70 - 99 mg/dL    Comment: Glucose reference range applies only to samples taken after fasting for at least 8 hours.  CBC      Status: Abnormal   Collection Time: 09/17/21  6:11 AM  Result Value Ref Range   WBC 28.8 (H) 4.0 - 10.5 K/uL   RBC 4.54 3.87 - 5.11 MIL/uL   Hemoglobin 14.4 12.0 - 15.0 g/dL   HCT 16.1 09.6 - 04.5 %   MCV 94.9 80.0 - 100.0 fL   MCH 31.7 26.0 - 34.0 pg   MCHC 33.4 30.0 - 36.0 g/dL   RDW 40.9 81.1 - 91.4 %   Platelets 260 150 - 400 K/uL   nRBC 0.0 0.0 - 0.2 %    Comment: Performed at [facility], [address] [place], [place] [address]  Basic metabolic panel     Status: Abnormal   Collection Time: 09/17/21  6:11 AM  Result Value Ref Range   Sodium 137 135 - 145 mmol/L   Potassium 4.5 3.5 - 5.1 mmol/L   Chloride 104 98 - 111 mmol/L   CO2 27 22 - 32 mmol/L   Glucose, Bld 126 (H) 70 - 99 mg/dL    Comment: Glucose reference range applies only to samples taken after fasting for at least 8 hours.   BUN 26 (H) 8 - 23 mg/dL   Creatinine, Ser 6.21 0.44 - 1.00 mg/dL   Calcium 8.3 (L) 8.9 - 10.3 mg/dL   GFR, Estimated >30 >86 mL/min    Comment: (NOTE) Calculated using the CKD-EPI Creatinine Equation (2021)    Anion gap 6 5 - 15    Comment: Performed at [facility], [address] [place], [place] [address]  APTT     Status: Abnormal   Collection Time: 09/17/21  6:11 AM  Result Value Ref Range   aPTT 90 (H) 24 - 36 seconds    Comment:        IF BASELINE aPTT IS ELEVATED, SUGGEST PATIENT RISK ASSESSMENT BE USED TO DETERMINE APPROPRIATE ANTICOAGULANT THERAPY. Performed at [facility], [address] [place], [place] [address]   Heparin level (unfractionated)     Status: Abnormal   Collection Time: 09/17/21  6:11 AM  Result Value Ref Range   Heparin Unfractionated 0.95 (H) 0.30 - 0.70 IU/mL    Comment: (NOTE) The clinical reportable range upper limit is being lowered to >1.10 to align with the FDA approved guidance for the current laboratory assay.  If heparin results are below expected values, and patient dosage has  been confirmed, suggest  follow up testing of antithrombin III levels. Performed at [facility], [address] [place], [place] [address]   Glucose, capillary     Status: Abnormal   Collection Time: 09/17/21  7:29 AM  Result Value Ref Range   Glucose-Capillary 131 (H) 70 - 99 mg/dL    Comment: Glucose reference range applies only to samples taken after fasting for at least 8 hours.  Glucose, capillary     Status: Abnormal   Collection Time: 09/17/21 11:23 AM  Result Value Ref Range   Glucose-Capillary 119 (H) 70 - 99 mg/dL    Comment: Glucose reference range applies only to samples taken after fasting for at least 8 hours.  APTT     Status: Abnormal   Collection Time: 09/17/21  1:39 PM  Result Value Ref Range   aPTT >160 (HH) 24 - 36 seconds    Comment:        IF BASELINE aPTT IS ELEVATED, SUGGEST PATIENT RISK ASSESSMENT BE USED TO DETERMINE APPROPRIATE ANTICOAGULANT THERAPY. CRITICAL RESULT CALLED TO, READ BACK BY AND VERIFIED WITH: MATTHEW RHYNE 09/17/21 1504 SLM Performed at [facility], [address] [place], [place] [address]   APTT     Status: Abnormal   Collection Time: 09/17/21  3:46 PM  Result Value Ref Range   aPTT 88 (H) 24 - 36 seconds    Comment:        IF BASELINE aPTT IS ELEVATED, SUGGEST PATIENT RISK ASSESSMENT BE USED TO DETERMINE APPROPRIATE ANTICOAGULANT THERAPY. Performed at [facility], [address] [place], [place] [address]   Glucose, capillary     Status: Abnormal   Collection Time: 09/17/21  4:24 PM  Result Value Ref Range   Glucose-Capillary 142 (H) 70 - 99 mg/dL    Comment: Glucose reference range applies only to samples taken after fasting for at least 8 hours.  Glucose, capillary     Status: Abnormal   Collection Time: 09/17/21  8:01 PM  Result Value Ref Range   Glucose-Capillary 122 (H) 70 - 99 mg/dL    Comment: Glucose reference range applies only to samples taken after fasting for at least 8 hours.  Glucose, capillary      Status: Abnormal   Collection Time: 09/17/21 11:47 PM  Result Value Ref Range   Glucose-Capillary 130 (H) 70 - 99 mg/dL    Comment: Glucose reference range applies only to samples taken after fasting for at least 8 hours.  CBC     Status: Abnormal   Collection Time: 09/18/21  4:24 AM  Result Value Ref Range   WBC 27.9 (H) 4.0 - 10.5 K/uL   RBC 3.82 (L) 3.87 - 5.11 MIL/uL   Hemoglobin 12.2 12.0 - 15.0 g/dL   HCT 27.0 (L) 35.0 - 09.3 %   MCV 93.7 80.0 - 100.0 fL   MCH 31.9 26.0 - 34.0 pg   MCHC 34.1 30.0 - 36.0 g/dL   RDW 81.8 29.9 - 37.1 %   Platelets 236 150 - 400 K/uL   nRBC 0.0 0.0 - 0.2 %    Comment: Performed at [facility], [address], [place], [place] [address]  Basic metabolic panel     Status: Abnormal   Collection Time: 09/18/21  4:24 AM  Result Value Ref Range   Sodium 133 (L) 135 - 145 mmol/L   Potassium 4.1 3.5 - 5.1 mmol/L   Chloride 101 98 - 111 mmol/L   CO2 27 22 - 32 mmol/L   Glucose, Bld 126 (H) 70 - 99 mg/dL    Comment: Glucose reference range applies only to samples taken after fasting for at least 8 hours.   BUN 21 8 - 23 mg/dL  Creatinine, Ser 0.58 0.44 - 1.00 mg/dL   Calcium 8.0 (L) 8.9 - 10.3 mg/dL   GFR, Estimated >33 >82 mL/min    Comment: (NOTE) Calculated using the CKD-EPI Creatinine Equation (2021)    Anion gap 5 5 - 15    Comment: Performed at [facility], [address] [place], [place] [address]  Heparin level (unfractionated)     Status: None   Collection Time: 09/18/21  4:24 AM  Result Value Ref Range   Heparin Unfractionated 0.63 0.30 - 0.70 IU/mL    Comment: (NOTE) The clinical reportable range upper limit is being lowered to >1.10 to align with the FDA approved guidance for the current laboratory assay.  If heparin results are below expected values, and patient dosage has  been confirmed, suggest follow up testing of antithrombin III levels. Performed at [facility], [address]  [place], [place] [address]   APTT     Status: Abnormal   Collection Time: 09/18/21  4:24 AM  Result Value Ref Range   aPTT 98 (H) 24 - 36 seconds    Comment:        IF BASELINE aPTT IS ELEVATED, SUGGEST PATIENT RISK ASSESSMENT BE USED TO DETERMINE APPROPRIATE ANTICOAGULANT THERAPY. Performed at [facility], [address] [place], [place] [address]   Glucose, capillary     Status: Abnormal   Collection Time: 09/18/21  4:31 AM  Result Value Ref Range   Glucose-Capillary 119 (H) 70 - 99 mg/dL    Comment: Glucose reference range applies only to samples taken after fasting for at least 8 hours.  Glucose, capillary     Status: Abnormal   Collection Time: 09/18/21  8:02 AM  Result Value Ref Range   Glucose-Capillary 143 (H) 70 - 99 mg/dL    Comment: Glucose reference range applies only to samples taken after fasting for at least 8 hours.   Comment 1 Notify RN   Glucose, capillary     Status: Abnormal   Collection Time: 09/18/21 12:11 PM  Result Value Ref Range   Glucose-Capillary 146 (H) 70 - 99 mg/dL    Comment: Glucose reference range applies only to samples taken after fasting for at least 8 hours.    Recent Results (from the past 240 hour(s))  Urine Culture     Status: Abnormal   Collection Time: 09/15/21  5:40 PM   Specimen: Urine, Clean Catch  Result Value Ref Range Status   Specimen Description   Final    URINE, CLEAN CATCH Performed at [facility], [address], [place], [place] [address]    Special Requests   Final    NONE Performed at [facility], [address] [place], [place] [address]    Culture (A)  Final    <10,000 COLONIES/mL INSIGNIFICANT GROWTH Performed at [facility], [address] [place], [place] [address]    Report Status 09/17/2021 FINAL  Final    Lipid Panel No results for input(s): CHOL, TRIG, HDL, CHOLHDL, VLDL, LDLCALC in the last 72 hours.  Studies/Results: EEG adult  Result Date:  09/16/2021 Charlsie Quest, MD     09/16/2021  4:42 PM Patient Name: Joy Clark MRN: [id number] Epilepsy Attending: Charlsie Quest Referring Physician/Provider: Caryl Pina, MD Date: 09/16/2021 Duration: 23.21 mins Patient history:  71 year old female with AMS. 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 Exam reveals a severely encephalopathic patient who is nonverbal and not following any commands. EEG to evaluate for seizure Level of alertness: Awake AEDs during EEG study: None Technical aspects: This EEG study was done with scalp electrodes positioned according to the 10-20 International system of electrode placement. Electrical activity was acquired at a sampling rate of  and reviewed with a high frequency filter of  and a low frequency filter of . EEG data were recorded continuously and digitally stored. Description: No posterior dominant rhythm was seen. EEG showed continuous generalized 3 to 6 Hz theta-delta slowing. Generalized periodic discharges with triphasic morphology at  1 Hz were also noted intermittently. Physiologic photic driving was not seen during photic stimulation.  Hyperventilation was not performed.   ABNORMALITY - Periodic discharges with triphasic morphology, generalized ( GPDs) - Continuous slow, generalized IMPRESSION: This study is suggestive of moderate to severe diffuse encephalopathy, nonspecific etiology but likely related to toxic-metabolic causes. No seizures or definite epileptiform discharges were seen throughout the recording. Priyanka Annabelle Harman    Medications: Scheduled:  [START ON 09/19/2021] atorvastatin  40 mg Per Tube Daily   carvedilol  25 mg Per Tube BID WC   feeding supplement (PROSource TF)  45 mL Per Tube BID   free water  200 mL Per Tube Q4H   [START ON 09/19/2021] lisinopril  40 mg Per Tube Daily   [START ON 09/19/2021] methimazole  5 mg Per Tube  Daily   pantoprazole (PROTONIX) IV  40 mg Intravenous Q24H   [START ON 09/19/2021] spironolactone  50 mg Per Tube Daily   Continuous:  acyclovir 500 mg (09/18/21 1513)   ampicillin (OMNIPEN) IV 2 g (09/18/21 1253)   cefTRIAXone (ROCEPHIN)  IV 2 g (09/18/21 1515)   feeding supplement (OSMOLITE 1.5 CAL) 1,000 mL (09/17/21 1922)   heparin 900 Units/hr (09/17/21 1300)   Assessment: 71 year old female with AMS. 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  1. Exam today: Continues to improve gradually (see exam above for detailed information).  2. Brain imaging: - MRI brain without contrast: Small acute infarct in the left thalamocapsular region. Additional punctate more subacute appearing right thalamic infarct. Mild to moderate chronic microvascular ischemic changes. 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 - MRI brain with contrast: No pathologic enhancement or MR evidence for acute intracranial infection. 3. TTE shows LVEF of 60-65% with normal LV function. No mural thrombus or valvular vegetation mentioned in the report.  4. 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 EEG: This study is suggestive of moderate to severe diffuse encephalopathy, nonspecific etiology but likely related to toxic-metabolic causes. No seizures or definite epileptiform discharges were seen throughout the recording. 6. DDx:  - Encephalopathy cannot be explained by her stroke and it is too severe to be solely due to UTI given lack of improvement with ABX. An unusual etiology such as an encephalitis secondary to viral infection involving 2 or more organ systems (e.g. lung, GI tract and brain) is felt to be a significant differential diagnostic consideration.  - Subclinical seizures unlikely given no epileptiform abnormalities on EEG.  - Bickerstaff's brainstem encephalitis and  cerebral vasculitis can both be associated with ulcerative colitis in rare instances affecting level of consciousness together with GI complaints and even cold symptoms.  - Cytomegalovirus colitis can be accompanied by encphalopathy.  [place], given the  cold weather, would not expect an insect-borne viral infection during this time of the year.  - Lymphocytic choriomeningits virus secondary to transmission from mice could present with her sequence of symptoms.  - Catatonia is also on the DDx 7. ID saw the patient yesterday. Recommended to discontinue vancomycin, continue ampicillin/ceftriaxone/acyclovir and obtain LP. Agrees that infectious enterocolitis may have been the initial condition precipitating admission. Regarding the patient's ongoing encephalopathy, ID note indicates that HSV encephalitis or an aseptic/viral meningitis (such as adnenovirus, which could also cause GI symptoms) are on the DDx.   8. HIV screen 4th generation wRfx: Non-reactive.    Recommendations: 1. Continue ampicillin/ceftriaxone/acyclovir. Per ID, can discontinue vancomycin - will defer to primary team for orders.  2. Lumbar puncture under fluoroscopy with sedation (will need fluoro with sedation due to agitation to moderate noxious stimuli on exam and inability to follow commands). Obtain cell count with differential, protein, glucose, cryptococcal antigen, fungal culture, fungal stain, AFB smear and culture, bacterial culture and gram stain, IgG index, as well as PCR for HSV, CMV, HIV and VZV. Save 5 to 10 cc additional fluid for possible future ID tests.  3. Continue apixaban for stroke prophylaxis in the setting of a-fib over the long term. However, in order to obtain LP, will need to hold apixaban, cover with IV heparin and then hold IV heparin for 4 hours prior to LP. Can then restart heparin 2 hours after LP. DOACs can be restarted six to eight hours after an atraumatic LP if there is no blood or xanthochromia in  the CSF to suggest bleeding.  4. Labs pending:  - Anti-GQ1b antibodies (ordered as [facility] send out on 3/9) - Serum CMV PCR (ordered on 3/9) - Lyme titer from CSF ordered (pending sample) - Lymphocytic choriomeningitis virus IgG and IgM (ordered as [facility] send out on 3/9) 5. If LP is negative, can consider an Ativan challenge to assess for possible catatonia, which should improve transiently after benzodiazepine administration, but often requires repeated doses.    LOS: 11 days    signed: Dr. Caryl Pina 09/18/2021  4:27 PM

## 2021-09-18 NOTE — Progress Notes (Addendum)
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  Continue on IV heparin prepare for LP on 09/19/21 as per neuro. S/p EEG which showed mod-severe diffuse encephalopathy but no seizure or definite epileptiform discharges were seen. Neuro following and recs apprec   Acute metabolic encephalopathy: no hx of dementia as per pt's son. Likely secondary to CVA vs infection. About the same as yesterday w/ intermittently shaking head yes & no to questions & answered 2 simple questions appropriately. Continue w/ supportive care   Dysphagia: continue w/ tube feeds but will hold tube feeds starting 09/18/21 at 11PM to prepare for LP on 09/19/21   A. fib: w/ RVR. New onset. Back in sinus rhythm. Continue on coreg, IV heparin    Low TSH:  signs of hyperthyroid. Possible Grave's disease. Thyroid US did not show suspicious nodules. T4 elevated, T3 normal. Continue on coreg, methimazole. Will need to f/u outpatient w/ endocrinology   HTN: uncontrolled. Continue on coreg, spironolactone , lisinopril    Potential for opioid withdrawal: family endorses that patient takes large amount of percocet scheduled for LE pain. Percocet prn    Gallbladder disease: no urgent surgical intervention as per gen surg.   Leukocytosis: labile. Procal  <0.10. Continue on IV ampicllin, rocephin & acyclovir as per ID    Hypokalemia: WNL today.   Hypernatremia: resolved    UTI: completed abx course    Enterocolitis: likely viral vs inflammatory. GI PCR panel, c. diff both neg   Likely moderate protein calorie malnutriton: continue w/ tube feeds but will hold tube feeds starting 09/18/21 at 11PM to prepare for LP on 09/19/21   DVT prophylaxis: eliquis  Code Status:  full  Family Communication: discussed pt's care w/ pt's daughter, Fleet ContrasRachel, and answered her questions  Disposition Plan:  possibly d/c to SNF   Level of care: Progressive  Status is: Inpatient Remains inpatient appropriate because:  LP tomorrow. Hold tube feeds at 11PM tonight 09/18/21    Consultants:  Nephro   Procedures:   Antimicrobials:   Subjective: Able to answer 2 simple questions appropriately today.   Objective: Vitals:   09/17/21 1957 09/17/21 2345 09/18/21 0430 09/18/21 0500  BP: (!) 138/58 (!) 100/41 (!) 102/44   Pulse: 72 (!) 55 70   Resp: (!) 24 17 (!) 26   Temp: 98.8 F (37.1 C) 98.5 F (36.9 C) 98.1 F (36.7 C)   TempSrc:      SpO2: 95% 97% 98%   Weight:    51.1 kg  Height:        Intake/Output Summary (Last 24 hours) at 09/18/2021 0738 Last data filed at 09/18/2021 0500 Gross per 24 hour  Intake 0 ml  Output 1025 ml  Net -1025 ml   Filed Weights   09/16/21 0549  09/17/21 0455 09/18/21 0500  Weight: 49.2 kg 51.1 kg 51.1 kg    Examination:  General exam: Appears calm. Frail appearing  Respiratory system: diminished breath sounds b/l. No wheezes Cardiovascular system: S1/S2+. No rubs or gallops  Gastrointestinal system: abd is soft, NT, ND & hyperactive bowel sounds  Central nervous system: awake and makes eye contact. Intermittently follows simple commands  Psychiatry: judgement and insight appears poor. Flat mood and affect    Data Reviewed: I have personally reviewed following labs and imaging studies  CBC: Recent Labs   Lab 09/14/21 0620 09/15/21 0446 09/16/21 0431 09/17/21 0611 09/18/21 0424  WBC 22.8* 26.6* 25.9* 28.8* 27.9*  HGB 16.9* 16.0* 15.6* 14.4 12.2  HCT 49.7* 48.2* 47.9* 43.1 35.8*  MCV 92.7 94.9 96.6 94.9 93.7  PLT 212 218 240 260 236   Basic Metabolic Panel: Recent Labs  Lab 09/12/21 1304 09/12/21 1715 09/13/21 0619 09/13/21 1713 09/14/21 0620 09/15/21 0446 09/16/21 0431 09/17/21 0611 09/18/21 0424  NA  --   --  140  --  144 140 140 137 133*  K  --   --  3.6  --  4.0 4.1 4.5 4.5 4.1  CL  --   --  102  --  108 105 106 104 101  CO2  --   --  28  --  26 25 27 27 27   GLUCOSE  --   --  146*  --  154* 138* 137* 126* 126*  BUN  --   --  25*  --  33* 36* 36* 26* 21  CREATININE  --   --  0.68  --  0.68 0.58 0.65 0.66 0.58  CALCIUM  --   --  8.8*  --  9.1 8.8* 9.0 8.3* 8.0*  MG 1.6* 1.7 1.8 1.8  --   --   --   --   --   PHOS 3.1 2.9 2.8 2.6  --   --   --   --   --    GFR: Estimated Creatinine Clearance: 52 mL/min (by C-G formula based on SCr of 0.58 mg/dL). Liver Function Tests: Recent Labs  Lab 09/12/21 0425 09/14/21 0620  AST 16 22  ALT 13 20  ALKPHOS 61 76  BILITOT 0.6 0.8  PROT 6.5 6.1*  ALBUMIN 2.8* 2.8*   No results for input(s): LIPASE, AMYLASE in the last 168 hours.  No results for input(s): AMMONIA in the last 168 hours. Coagulation Profile: No results for input(s): INR, PROTIME in the last 168 hours.  Cardiac Enzymes: No results for input(s): CKTOTAL, CKMB, CKMBINDEX, TROPONINI in the last 168 hours. BNP (last 3 results) No results for input(s): PROBNP in the last 8760 hours. HbA1C: No results for input(s): HGBA1C in the last 72 hours. CBG: Recent Labs  Lab 09/17/21 1123 09/17/21 1624 09/17/21 2001 09/17/21 2347 09/18/21 0431  GLUCAP 119* 142* 122* 130* 119*   Lipid Profile: No results for input(s): CHOL, HDL, LDLCALC, TRIG, CHOLHDL, LDLDIRECT in the last 72 hours. Thyroid Function Tests: No results for input(s): TSH, T4TOTAL, FREET4, T3FREE,  THYROIDAB in the last 72 hours. Anemia Panel: No results for input(s): VITAMINB12, FOLATE, FERRITIN, TIBC, IRON, RETICCTPCT in the last 72 hours. Sepsis Labs: Recent Labs  Lab 09/15/21 0446  PROCALCITON <0.10    Recent Results (from the past 240 hour(s))  Urine Culture     Status: Abnormal   Collection Time: 09/15/21  5:40 PM   Specimen: Urine, Clean Catch  Result Value Ref  Range Status   Specimen Description   Final    URINE, CLEAN CATCH Performed at [facility], [address] [place], [place] [address]    Special Requests   Final    NONE Performed at [facility], [address] [place], [place] [address]    Culture (A)  Final    <10,000 COLONIES/mL INSIGNIFICANT GROWTH Performed at [facility], [address], [place], [place] [address]    Report Status 09/17/2021 FINAL  Final         Radiology Studies: EEG adult  Result Date: 2021-10-01 Charlsie Quest, MD     10-01-21  4:42 PM Patient Name: Dajana Gehrig MRN: [id number] Epilepsy Attending: Charlsie Quest Referring Physician/Provider: Caryl Pina, MD Date: 10-01-21 Duration: 23.21 mins Patient history:  71 year old female with AMS. 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 Exam reveals a severely encephalopathic patient who is nonverbal and not following any commands. EEG to evaluate for seizure Level of alertness: Awake AEDs during EEG study: None Technical aspects: This EEG study was done with scalp electrodes positioned according to the 10-20 International system of electrode placement. Electrical activity was acquired at a sampling rate of 500Hz  and reviewed with a high frequency filter of 70Hz  and a low frequency filter of 1Hz . EEG data were recorded continuously and digitally stored. Description: No posterior dominant rhythm was seen. EEG showed continuous generalized 3 to 6 Hz  theta-delta slowing. Generalized periodic discharges with triphasic morphology at  1 Hz were also noted intermittently. Physiologic photic driving was not seen during photic stimulation.  Hyperventilation was not performed.   ABNORMALITY - Periodic discharges with triphasic morphology, generalized ( GPDs) - Continuous slow, generalized IMPRESSION: This study is suggestive of moderate to severe diffuse encephalopathy, nonspecific etiology but likely related to toxic-metabolic causes. No seizures or definite epileptiform discharges were seen throughout the recording. Priyanka        Scheduled Meds:  atorvastatin  40 mg Oral Daily   carvedilol  25 mg Oral BID WC   feeding supplement (PROSource TF)  45 mL Per Tube BID   free water  200 mL Per Tube Q4H   lisinopril  40 mg Oral Daily   methimazole  5 mg Oral Daily   pantoprazole (PROTONIX) IV  40 mg Intravenous Q24H   spironolactone  50 mg Oral Daily   Continuous Infusions:  acyclovir 500 mg (09/18/21 0619)   ampicillin (OMNIPEN) IV 2 g (09/18/21 0415)   cefTRIAXone (ROCEPHIN)  IV 2 g (09/18/21 0327)   feeding supplement (OSMOLITE 1.5 CAL) 1,000 mL (09/17/21 1922)   heparin 900 Units/hr (09/17/21 1300)     LOS: 11 days    Time spent: 15  mins     11/18/21, MD [facility] Pager [phone]  If 7PM-7AM, please contact night-coverage 09/18/2021, 7:38 AM

## 2021-09-18 NOTE — Progress Notes (Signed)
ANTICOAGULATION CONSULT NOTE  ? ?Pharmacy Consult for IV heparin ?Indication: atrial fibrillation ? ?Not on File ? ?Patient Measurements: ?Height: 5\' 10"  (177.8 cm) ?Weight: 51.1 kg (112 lb 10.5 oz) ?IBW/kg (Calculated) : 68.5 ?Heparin Dosing Weight: 49.2 kg ? ?Vital Signs: ?Temp: 98.1 ?F (36.7 ?C) (03/12 0430) ?BP: 102/44 (03/12 0430) ?Pulse Rate: 70 (03/12 0430) ? ?Labs: ?Recent Labs  ?  09/16/21 ?0431 09/16/21 ?0922 09/16/21 ?1817 09/17/21 ?11/17/21 09/17/21 ?1339 09/17/21 ?1546 09/18/21 ?0424  ?HGB 15.6*  --   --  14.4  --   --  12.2  ?HCT 47.9*  --   --  43.1  --   --  35.8*  ?PLT 240  --   --  260  --   --  236  ?APTT  --  28   < > 90* >160* 88* 98*  ?HEPARINUNFRC  --  >1.10*  --  0.95*  --   --  0.63  ?CREATININE 0.65  --   --  0.66  --   --  0.58  ? < > = values in this interval not displayed.  ? ? ? ?Estimated Creatinine Clearance: 52 mL/min (by C-G formula based on SCr of 0.58 mg/dL). ? ? ?Medical History: ?Past Medical History:  ?Diagnosis Date  ? Fibromyalgia   ? ? ?Medications:  ?Medications Prior to Admission  ?Medication Sig Dispense Refill Last Dose  ? albuterol (VENTOLIN HFA) 108 (90 Base) MCG/ACT inhaler Inhale into the lungs every 6 (six) hours as needed for wheezing or shortness of breath.   Past Week at unknown prn  ? atorvastatin (LIPITOR) 40 MG tablet Take 40 mg by mouth daily.   Past Week  ? carboxymethylcellulose 1 % ophthalmic solution 1 drop 3 (three) times daily.   Past Week  ? celecoxib (CELEBREX) 200 MG capsule Take 200 mg by mouth 2 (two) times daily.   Past Week  ? cilostazol (PLETAL) 100 MG tablet Take 100 mg by mouth 2 (two) times daily.   Past Week  ? fluticasone (FLONASE) 50 MCG/ACT nasal spray Place into both nostrils daily.   Past Week at unknown prn  ? gabapentin (NEURONTIN) 800 MG tablet Take 800 mg by mouth 3 (three) times daily.   Past Week  ? omeprazole (PRILOSEC) 40 MG capsule Take 40 mg by mouth daily.   Past Week  ? ondansetron (ZOFRAN) 8 MG tablet Take by mouth every 8  (eight) hours as needed for nausea or vomiting.   Past Week at unknown prn  ? oxyCODONE-acetaminophen (PERCOCET) 10-325 MG tablet Take 1 tablet by mouth every 4 (four) hours as needed for pain.   Past Week  ? sodium chloride (OCEAN) 0.65 % SOLN nasal spray Place 1 spray into both nostrils as needed for congestion.   Past Week at unknown prn  ? ?Scheduled:  ? atorvastatin  40 mg Oral Daily  ? carvedilol  25 mg Oral BID WC  ? feeding supplement (PROSource TF)  45 mL Per Tube BID  ? free water  200 mL Per Tube Q4H  ? lisinopril  40 mg Oral Daily  ? methimazole  5 mg Oral Daily  ? pantoprazole (PROTONIX) IV  40 mg Intravenous Q24H  ? spironolactone  50 mg Oral Daily  ? ?Infusions:  ? acyclovir 500 mg (09/17/21 2215)  ? ampicillin (OMNIPEN) IV 2 g (09/18/21 0415)  ? cefTRIAXone (ROCEPHIN)  IV 2 g (09/18/21 0327)  ? feeding supplement (OSMOLITE 1.5 CAL) 1,000 mL (09/17/21 1922)  ?  heparin 900 Units/hr (09/17/21 1300)  ? ?PRN: albuterol, ondansetron **OR** ondansetron (ZOFRAN) IV, oxyCODONE-acetaminophen ?Anti-infectives (From admission, onward)  ? ? Start     Dose/Rate Route Frequency Ordered Stop  ? 09/17/21 2200  acyclovir (ZOVIRAX) 500 mg in dextrose 5 % 100 mL IVPB  Status:  Discontinued       ? 500 mg ?110 mL/hr over 60 Minutes Intravenous Every 8 hours 09/17/21 1038 09/17/21 1050  ? 09/17/21 1400  acyclovir (ZOVIRAX) 500 mg in dextrose 5 % 100 mL IVPB       ? 500 mg ?110 mL/hr over 60 Minutes Intravenous Every 8 hours 09/17/21 1050    ? 09/17/21 1200  ampicillin (OMNIPEN) 2 g in sodium chloride 0.9 % 100 mL IVPB       ? 2 g ?300 mL/hr over 20 Minutes Intravenous Every 4 hours 09/17/21 1036    ? 09/16/21 2300  vancomycin (VANCOCIN) IVPB 1000 mg/200 mL premix  Status:  Discontinued       ?See Hyperspace for full Linked Orders Report.  ? 1,000 mg ?200 mL/hr over 60 Minutes Intravenous Every 24 hours 09/15/21 2157 09/17/21 1027  ? 09/15/21 2300  acyclovir (ZOVIRAX) 485 mg in dextrose 5 % 100 mL IVPB  Status:   Discontinued       ? 10 mg/kg ? 48.4 kg ?109.7 mL/hr over 60 Minutes Intravenous Every 12 hours 09/15/21 2146 09/17/21 1038  ? 09/15/21 2300  cefTRIAXone (ROCEPHIN) 2 g in sodium chloride 0.9 % 100 mL IVPB       ? 2 g ?200 mL/hr over 30 Minutes Intravenous Every 12 hours 09/15/21 2146    ? 09/15/21 2300  ampicillin (OMNIPEN) 2 g in sodium chloride 0.9 % 100 mL IVPB  Status:  Discontinued       ? 2 g ?300 mL/hr over 20 Minutes Intravenous Every 6 hours 09/15/21 2206 09/17/21 1036  ? 09/15/21 2245  vancomycin (VANCOREADY) IVPB 1250 mg/250 mL       ?See Hyperspace for full Linked Orders Report.  ? 1,250 mg ?166.7 mL/hr over 90 Minutes Intravenous  Once 09/15/21 2157 09/16/21 1911  ? 09/08/21 1200  cefTRIAXone (ROCEPHIN) 2 g in sodium chloride 0.9 % 100 mL IVPB       ? 2 g ?200 mL/hr over 30 Minutes Intravenous Every 24 hours 09/07/21 1506 09/13/21 1447  ? 09/07/21 2200  metroNIDAZOLE (FLAGYL) IVPB 500 mg  Status:  Discontinued       ? 500 mg ?100 mL/hr over 60 Minutes Intravenous Every 12 hours 09/07/21 1506 09/08/21 1407  ? 09/07/21 1445  metroNIDAZOLE (FLAGYL) IVPB 500 mg       ? 500 mg ?100 mL/hr over 60 Minutes Intravenous  Once 09/07/21 1435 09/07/21 1545  ? 09/07/21 1230  cefTRIAXone (ROCEPHIN) 1 g in sodium chloride 0.9 % 100 mL IVPB       ? 1 g ?200 mL/hr over 30 Minutes Intravenous  Once 09/07/21 1217 09/07/21 1320  ? 09/07/21 1230  azithromycin (ZITHROMAX) 500 mg in sodium chloride 0.9 % 250 mL IVPB       ? 500 mg ?250 mL/hr over 60 Minutes Intravenous  Once 09/07/21 1217 09/07/21 1424  ? ?  ? ? ?Assessment: ?70YOF PMH fibromyalgia, nicotine dependence, chronic pain syndrome and dyslipidemia who presented with AMS over past 4 days. Encephalopathic, now with new c/f meningitis/encephalitis. ? ?Switching to heparin in order to obtain LP. Planning to hold IV heparin x 4 hours prior to LP and restart  2 hours after LP. ? ?CBC stable (Hgb 15.6, Ptls 240). ? ?Date Time HL/aPTT Rate/Comment  ?3/10 1817 aPTT 55   Subthera 750 > 900 units/hr ?3/11 0611 aPTT 90/HL 0.95   aPTT therapeutic ?3/11 1339 aPTT >160 Supratherapeutic. Error when drawing. Repeat level ordered ?3/11 1546 aPTT 88 Therapeutic ?3/12     0424    aPTT 98/HL 0.63, therapeutic ? ?Goal of Therapy:  ?Heparin level 0.3-0.7 units/ml ?aPTT 66-102 seconds ?Monitor platelets by anticoagulation protocol: Yes ?  ?Plan:  ?aPTT therapeutic. Continue heparin infusion at 900 units/hr ?HL therapeutic and correlating with aPTT.  ?Daily CBC ?Next HL with AM labs. ? ?Clovia CuffLisa Evelyna Folker, PharmD, BCPS ?09/18/2021 ?5:57 AM ? ? ? ? ? ?

## 2021-09-19 ENCOUNTER — Inpatient Hospital Stay: Payer: Medicare Other | Admitting: Radiology

## 2021-09-19 DIAGNOSIS — K529 Noninfective gastroenteritis and colitis, unspecified: Secondary | ICD-10-CM | POA: Diagnosis not present

## 2021-09-19 DIAGNOSIS — G9341 Metabolic encephalopathy: Secondary | ICD-10-CM | POA: Diagnosis not present

## 2021-09-19 DIAGNOSIS — R1319 Other dysphagia: Secondary | ICD-10-CM | POA: Diagnosis not present

## 2021-09-19 DIAGNOSIS — R4182 Altered mental status, unspecified: Secondary | ICD-10-CM | POA: Diagnosis not present

## 2021-09-19 DIAGNOSIS — G0490 Encephalitis and encephalomyelitis, unspecified: Secondary | ICD-10-CM

## 2021-09-19 DIAGNOSIS — I639 Cerebral infarction, unspecified: Secondary | ICD-10-CM | POA: Diagnosis not present

## 2021-09-19 DIAGNOSIS — R932 Abnormal findings on diagnostic imaging of liver and biliary tract: Secondary | ICD-10-CM | POA: Diagnosis not present

## 2021-09-19 HISTORY — PX: IR FL GUIDED LOC OF NEEDLE/CATH TIP FOR SPINAL INJECTION LT: IMG2396

## 2021-09-19 LAB — CSF CELL COUNT WITH DIFFERENTIAL
Eosinophils, CSF: 0 %
Lymphs, CSF: 44 %
Monocyte-Macrophage-Spinal Fluid: 56 %
RBC Count, CSF: 2 /mm3 (ref 0–3)
Segmented Neutrophils-CSF: 0 %
Tube #: 3
WBC, CSF: 7 /mm3 — ABNORMAL HIGH (ref 0–5)

## 2021-09-19 LAB — BASIC METABOLIC PANEL
Anion gap: 6 (ref 5–15)
BUN: 20 mg/dL (ref 8–23)
CO2: 27 mmol/L (ref 22–32)
Calcium: 8 mg/dL — ABNORMAL LOW (ref 8.9–10.3)
Chloride: 102 mmol/L (ref 98–111)
Creatinine, Ser: 0.76 mg/dL (ref 0.44–1.00)
GFR, Estimated: >60 mL/min (ref >60)
Glucose, Bld: 113 mg/dL — ABNORMAL HIGH (ref 70–99)
Potassium: 4 mmol/L (ref 3.5–5.1)
Sodium: 135 mmol/L (ref 135–145)

## 2021-09-19 LAB — CBC
HCT: 33.5 % — ABNORMAL LOW (ref 36.0–46.0)
Hemoglobin: 11.2 g/dL — ABNORMAL LOW (ref 12.0–15.0)
MCH: 31.6 pg (ref 26.0–34.0)
MCHC: 33.4 g/dL (ref 30.0–36.0)
MCV: 94.6 fL (ref 80.0–100.0)
Platelets: 244 10*3/uL (ref 150–400)
RBC: 3.54 MIL/uL — ABNORMAL LOW (ref 3.87–5.11)
RDW: 12.5 % (ref 11.5–15.5)
WBC: 18.7 10*3/uL — ABNORMAL HIGH (ref 4.0–10.5)
nRBC: 0 % (ref 0.0–0.2)

## 2021-09-19 LAB — PROTEIN, CSF: Total  Protein, CSF: 45 mg/dL (ref 15–45)

## 2021-09-19 LAB — CRYPTOCOCCAL ANTIGEN, CSF: Crypto Ag: NEGATIVE

## 2021-09-19 LAB — GLUCOSE, CSF: Glucose, CSF: 64 mg/dL (ref 40–70)

## 2021-09-19 LAB — GLUCOSE, CAPILLARY
Glucose-Capillary: 122 mg/dL — ABNORMAL HIGH (ref 70–99)
Glucose-Capillary: 112 mg/dL — ABNORMAL HIGH (ref 70–99)
Glucose-Capillary: 119 mg/dL — ABNORMAL HIGH (ref 70–99)
Glucose-Capillary: 124 mg/dL — ABNORMAL HIGH (ref 70–99)
Glucose-Capillary: 129 mg/dL — ABNORMAL HIGH (ref 70–99)

## 2021-09-19 LAB — HEPARIN LEVEL (UNFRACTIONATED): Heparin Unfractionated: 0.42 IU/mL (ref 0.30–0.70)

## 2021-09-19 IMAGING — XA IR FLUORO GUIDE SPINAL/SI JT INJ*L*
1 series · 2 of 2 positions shown · non-contrast
Comparison: none

CLINICAL DATA: Acute metabolic encephalopathy

[Series 1: interv standard · 2 of 2 slices shown]
[im 1/2]
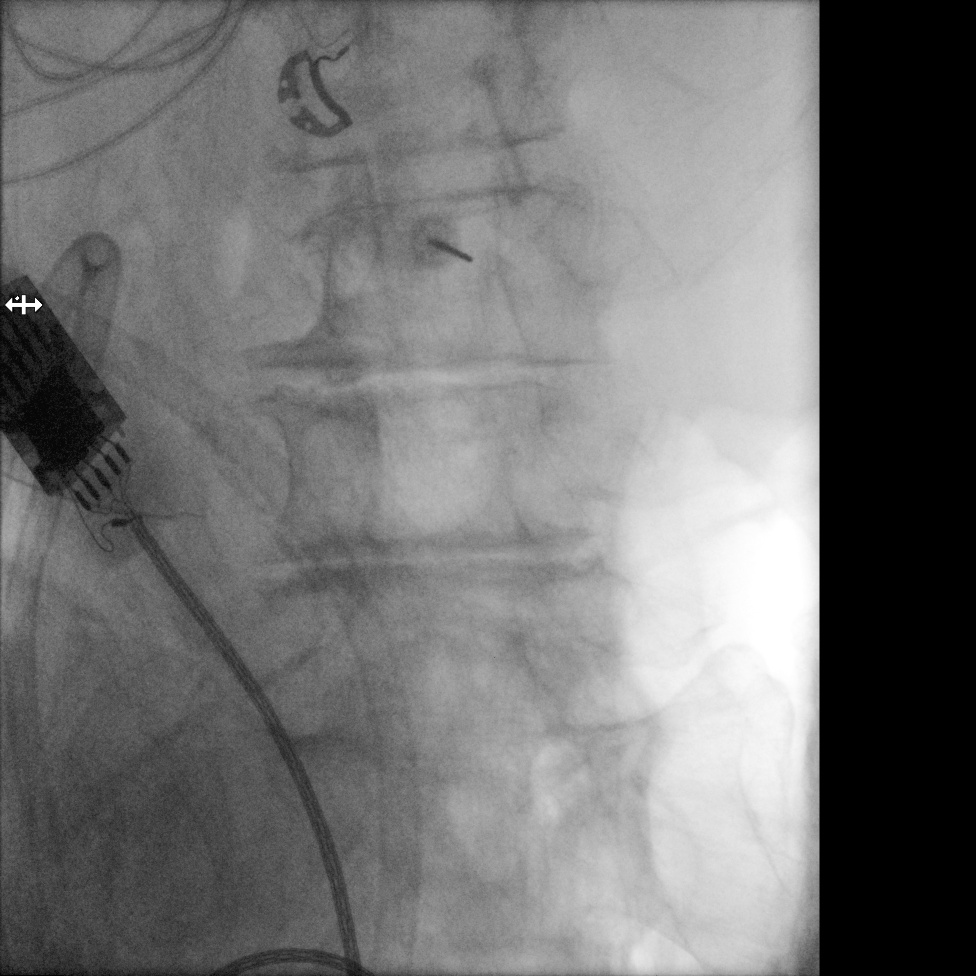
[im 2/2]
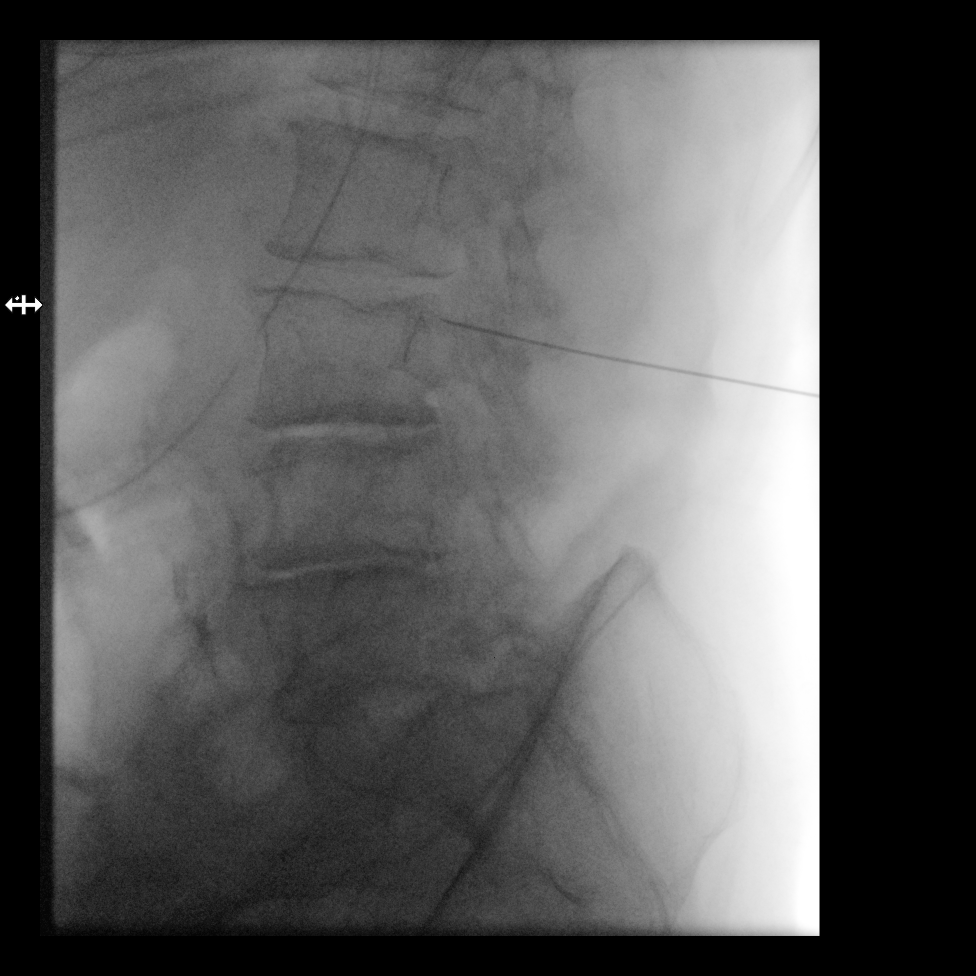

[2 of 2 positions shown; findings below may reference images not displayed]

EXAM:
Lumbar puncture using fluoroscopic guidance

ANESTHESIA/SEDATION:
Local analgesia

MEDICATIONS:
None

CONTRAST:  None

FLUOROSCOPY TIME:
FLUOROSCOPY TIME
0.8 minutes (6 mGy)

PROCEDURE:
The patient was placed in the lateral decubitus position on the exam
table. Skin entry site was marked using fluoroscopic guidance. The
overlying skin was prepped and draped in the standard sterile
fashion. Local analgesia was obtained with 1% lidocaine. Using
fluoroscopic guidance, 20 gauge spinal needle was advanced into the
subarachnoid space at L2-L3 using a midline approach under
fluoroscopic guidance. There was spontaneous return of clear,
colorless CSF. Opening pressure was estimated at 8 cm H2O.
Approximately 8 mL of CSF was collected and sent to the lab for
analysis. Needle was removed and hemostasis was achieved the access
site using manual pressure. Clean dressing was placed. The patient
tolerated the procedure well without immediate complication.

COMPLICATIONS:
None immediate
FINDINGS: See above.
IMPRESSION: Successful lumbar puncture at L2-L3 using fluoroscopic guidance.
Opening pressure was estimated at 8 cm H2O. A sample of CSF was sent
to the lab for analysis.

## 2021-09-19 MED ORDER — SODIUM CHLORIDE 0.9 % IV BOLUS
1000.0000 mL | Freq: Once | INTRAVENOUS | Status: AC
  Administered 2021-09-19: 1000 mL via INTRAVENOUS

## 2021-09-19 MED ORDER — ORAL CARE MOUTH RINSE
15.0000 mL | Freq: Two times a day (BID) | OROMUCOSAL | Status: DC
  Administered 2021-09-19 – 2021-09-24 (×9): 15 mL via OROMUCOSAL

## 2021-09-19 MED ORDER — APIXABAN 5 MG PO TABS
5.0000 mg | ORAL_TABLET | Freq: Two times a day (BID) | ORAL | Status: DC
  Administered 2021-09-19 – 2021-09-24 (×10): 5 mg via ORAL
  Filled 2021-09-19 (×10): qty 1

## 2021-09-19 MED ORDER — DEXTROSE 10 % IV SOLN: INTRAVENOUS | Status: DC

## 2021-09-19 MED ORDER — LIDOCAINE HCL 1 % IJ SOLN
INTRAMUSCULAR | Status: AC
  Filled 2021-09-19: qty 20

## 2021-09-19 NOTE — Progress Notes (Signed)
OT Cancellation Note ? ?Patient Details ?Name: Joy Clark ?MRN: [id number] ?DOB: [birth year] ? ? ?Cancelled Treatment:    Reason Eval/Treat Not Completed: Patient at procedure or test/ unavailable. Pt currently off floor at procedure (per chart plan for lumbar puncture today).  Will re-attempt at later time/date to follow POC.  ? ?Kathie Dike, M.S. OTR/L  ?09/19/21, 12:17 PM  ?ascom [phone] ? ?

## 2021-09-19 NOTE — Progress Notes (Signed)
Patient clinically stable post LP via Dr El-ABd, tolerated well. Patient sommulent pre procedure. Only local anesthetic given for procedure due to mental status and vitals. Which have unchanged post procedure. Report given to care nurse on 2A with questions answered. ?

## 2021-09-19 NOTE — Progress Notes (Addendum)
?PROGRESS NOTE ? ? ? ?Joy Clark  [id number] DOB: 1950/08/26 DOA: 09/07/2021 ?PCP: Pcp, No  ? ? ?Assessment & Plan: ?  ?Principal Problem: ?  Acute metabolic encephalopathy ?Active Problems: ?  Enterocolitis ?  UTI (urinary tract infection) ?  Hypokalemia ?  Abnormal CT scan, gallbladder ?  Altered mental status ?  Colitis ?  Dehydration ?  Gallbladder hydrops ?  Hyperthyroidism ?  Nausea vomiting and diarrhea ?  RUQ pain ?  Atrial fibrillation with RVR ([place]) ?  Hypernatremia ?  Aspiration into airway ?  Malnutrition of moderate degree ?  Dysphagia ? 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 IV heparin on hold for LP. S/p LP 09/19/21, CSF studies are pending. S/p EEG which showed mod-severe diffuse encephalopathy but no seizure or definite epileptiform discharges were seen. Neuro following and recs apprec  ? ?Acute metabolic encephalopathy: no hx of dementia as per pt's son. Likely secondary to CVA vs infection. Lethargic today and not answering any of my questions today. Continue w/ supportive care. S/p LP 09/19/21, CSF studies are pending  ? ?Dysphagia: continue w/ tube feeds but will hold tube feeds starting 09/18/21 at 11PM to prepare for LP on 09/19/21 ?  ?A. fib: w/ RVR. New onset. Back in sinus rhythm. Continue on coreg. Hold heparin  ?  ?Low TSH:  signs of hyperthyroid. Possible Grave's disease. Thyroid US did not show suspicious nodules. T4 elevated, T3 normal. Continue on coreg, methimazole. Will need to f/u outpatient w/ endocrinology  ? ?HTN: uncontrolled. Continue on lisinopril, coreg & aldactone  ?  ?Potential for opioid withdrawal: family endorses that patient takes large amount of percocet scheduled for LE pain. Percocet prn  ?  ?Gallbladder disease: no urgent surgical intervention as per gen surg.  ? ?Leukocytosis: trending down today. Procal < 0.10.  Continue on IV rocephin, ampicillin & acyclovir as per ID  ?  ?Hypokalemia: WNL today  ? ?Hypernatremia: resolved  ?  ?UTI: completed abx course  ?  ?Enterocolitis: likely viral vs inflammatory. GI PCR panel, c. diff both neg  ? ?Likely moderate protein calorie malnutriton: tube feeds on hold for LP. BMI 15.9 ? ? ?DVT prophylaxis: SCDs ?Code Status:  full  ?Family Communication: discussed pt's care w/ pt's daughter, Joy Clark, and answered her questions  ?Disposition Plan:  possibly d/c to SNF  ? ?Level of care: Progressive ? ?Status is: Inpatient ?Remains inpatient appropriate because:  s/p LP today, waiting on CSF studies  ? ? ?Consultants:  ?Nephro  ? ?Procedures:  ? ?Antimicrobials: ? ? ?Subjective: ?Pt is lethargic and not answering any questions today  ? ?Objective: ?Vitals:  ? 09/18/21 2028 09/18/21 2356 09/19/21 0459 09/19/21 0742  ?BP: (!) 123/48 (!) 107/43 (!) 119/52 (!) 141/59  ?Pulse: 67 69 (!) 54 68  ?Resp: 18 20 20 17   ?Temp: 99.3 ?F (37.4 ?C) 98.3 ?F (36.8 ?C) 98.1 ?F (36.7 ?C) 97.7 ?F (36.5 ?C)  ?TempSrc: Oral Oral Oral   ?SpO2: 96% 95% 97% 97%  ?Weight:   50.5 kg   ?Height:      ? ? ?Intake/Output Summary (Last 24 hours) at 09/19/2021 0744 ?Last data filed at 09/19/2021 0522 ?Gross per 24 hour  ?Intake 2842.06 ml  ?Output 1200 ml  ?Net 1642.06 ml  ? ?Filed Weights  ? 09/17/21 0455 09/18/21 0500 09/19/21 0459  ?Weight: 51.1 kg 51.1 kg 50.5  kg  ? ? ?Examination: ? ?General exam: Appears lethargic. Frail appearing  ?Respiratory system: decreased breath sounds b/l  ?Cardiovascular system: S1 & S2+. No rubs or clicks  ?Gastrointestinal system: Abd is soft, NT, ND & hypoactive bowel sounds  ?Central nervous system: Lethargic. Did not follow simple commands today  ?Psychiatry: judgement and insight appears poor. Flat mood and affect  ? ? ? ?Data Reviewed: I have personally reviewed following labs and imaging studies ? ?CBC: ?Recent Labs  ?Lab 09/15/21 ?0446 09/16/21 ?0431 09/17/21 ?[id number] 09/18/21 ?0424  09/19/21 ?[id number]  ?WBC 26.6* 25.9* 28.8* 27.9* 18.7*  ?HGB 16.0* 15.6* 14.4 12.2 11.2*  ?HCT 48.2* 47.9* 43.1 35.8* 33.5*  ?MCV 94.9 96.6 94.9 93.7 94.6  ?PLT 218 240 260 236 244  ? ?Basic Metabolic Panel: ?Recent Labs  ?Lab 09/12/21 ?1304 09/12/21 ?1715 09/13/21 ?[id number] 09/13/21 ?1713 09/14/21 ?[id number] 09/15/21 ?[id number] 09/16/21 ?[id number] 09/17/21 ?[id number] 09/18/21 ?0424 09/19/21 ?[id number]  ?NA  --   --  140  --    < > 140 140 137 133* 135  ?K  --   --  3.6  --    < > 4.1 4.5 4.5 4.1 4.0  ?CL  --   --  102  --    < > 105 106 104 101 102  ?CO2  --   --  28  --    < > 25 27 27 27 27   ?GLUCOSE  --   --  146*  --    < > 138* 137* 126* 126* 113*  ?BUN  --   --  25*  --    < > 36* 36* 26* 21 20  ?CREATININE  --   --  0.68  --    < > 0.58 0.65 0.66 0.58 0.76  ?CALCIUM  --   --  8.8*  --    < > 8.8* 9.0 8.3* 8.0* 8.0*  ?MG 1.6* 1.7 1.8 1.8  --   --   --   --   --   --   ?PHOS 3.1 2.9 2.8 2.6  --   --   --   --   --   --   ? < > = values in this interval not displayed.  ? ?GFR: ?Estimated Creatinine Clearance: 51.4 mL/min (by C-G formula based on SCr of 0.76 mg/dL). ?Liver Function Tests: ?Recent Labs  ?Lab 09/14/21 ?0620  ?AST 22  ?ALT 20  ?ALKPHOS 76  ?BILITOT 0.8  ?PROT 6.1*  ?ALBUMIN 2.8*  ? ?No results for input(s): LIPASE, AMYLASE in the last 168 hours. ? ?No results for input(s): AMMONIA in the last 168 hours. ?Coagulation Profile: ?No results for input(s): INR, PROTIME in the last 168 hours. ? ?Cardiac Enzymes: ?No results for input(s): CKTOTAL, CKMB, CKMBINDEX, TROPONINI in the last 168 hours. ?BNP (last 3 results) ?No results for input(s): PROBNP in the last 8760 hours. ?HbA1C: ?No results for input(s): HGBA1C in the last 72 hours. ?CBG: ?Recent Labs  ?Lab 09/18/21 ?1211 09/18/21 ?1631 09/18/21 ?2031 09/18/21 ?2358 09/19/21 ?[id number]  ?GLUCAP 146* 146* 115* 87 122*  ? ?Lipid Profile: ?No results for input(s): CHOL, HDL, LDLCALC, TRIG, CHOLHDL, LDLDIRECT in the last 72 hours. ?Thyroid Function Tests: ?No results for input(s): TSH, T4TOTAL,  FREET4, T3FREE, THYROIDAB in the last 72 hours. ?Anemia Panel: ?No results for input(s): VITAMINB12, FOLATE, FERRITIN, TIBC, IRON, RETICCTPCT in the last 72 hours. ?Sepsis Labs: ?Recent Labs  ?Lab 09/15/21 ?0446  ?PROCALCITON <0.10  ? ? ?Recent Results (  from the past 240 hour(s))  ?Urine Culture     Status: Abnormal  ? Collection Time: 09/15/21  5:40 PM  ? Specimen: Urine, Clean Catch  ?Result Value Ref Range Status  ? Specimen Description   Final  ?  URINE, CLEAN CATCH ?Performed at [facility], [address] [place], [place] [address] ?  ? Special Requests   Final  ?  NONE ?Performed at [facility], [address] [place], [place] [address] ?  ? Culture (A)  Final  ?  <10,000 COLONIES/mL INSIGNIFICANT GROWTH ?Performed at [facility], [place] [address] [place], [place] [address] ?  ? Report Status 09/17/2021 FINAL  Final  ?  ? ? ? ? ? ?Radiology Studies: ?No results found. ? ? ? ? ? ?Scheduled Meds: ? atorvastatin  40 mg Per Tube Daily  ? carvedilol  25 mg Per Tube BID WC  ? feeding supplement (PROSource TF)  45 mL Per Tube BID  ? free water  200 mL Per Tube Q4H  ? lisinopril  40 mg Per Tube Daily  ? mouth rinse  15 mL Mouth Rinse BID  ? methimazole  5 mg Per Tube Daily  ? pantoprazole (PROTONIX) IV  40 mg Intravenous Q24H  ? spironolactone  50 mg Per Tube Daily  ? ?Continuous Infusions: ? sodium chloride 10 mL/hr at 09/19/21 0522  ? acyclovir 500 mg (09/19/21 0523)  ? ampicillin (OMNIPEN) IV Stopped (09/19/21 0422)  ? cefTRIAXone (ROCEPHIN)  IV Stopped (09/19/21 0300)  ? dextrose 50 mL/hr at 09/19/21 0522  ? feeding supplement (OSMOLITE 1.5 CAL) Stopped (09/18/21 2300)  ? ? ? LOS: 12 days  ? ? ?Time spent: 15  mins  ? ? ? ?Wyvonnia Dusky, MD ?[facility] ?Pager [phone] ? ?If 7PM-7AM, please contact night-coverage ?09/19/2021, 7:44 AM  ? ?

## 2021-09-19 NOTE — Progress Notes (Signed)
PT Cancellation Note ? ?Patient Details ?Name: Joy Clark ?MRN: [id number] ?DOB: 06-12-1951 ? ? ?Cancelled Treatment:    Reason Eval/Treat Not Completed: Patient at procedure or test/unavailable.  Pt currently off floor at procedure (per chart plan for lumbar puncture today).  Will re-attempt PT session at a later date/time. ? ? ?Hendricks Limes, PT ?09/19/21, 11:59 AM ? ?

## 2021-09-19 NOTE — Progress Notes (Signed)
Subjective:  No specific complaints   Antibiotics:  Anti-infectives (From admission, onward)    Start     Dose/Rate Route Frequency Ordered Stop   09/17/21 2200  acyclovir (ZOVIRAX) 500 mg in dextrose 5 % 100 mL IVPB  Status:  Discontinued        500 mg 110 mL/hr over 60 Minutes Intravenous Every 8 hours 09/17/21 1038 09/17/21 1050   09/17/21 1400  acyclovir (ZOVIRAX) 500 mg in dextrose 5 % 100 mL IVPB        500 mg 110 mL/hr over 60 Minutes Intravenous Every 8 hours 09/17/21 1050     09/17/21 1200  ampicillin (OMNIPEN) 2 g in sodium chloride 0.9 % 100 mL IVPB  Status:  Discontinued        2 g 300 mL/hr over 20 Minutes Intravenous Every 4 hours 09/17/21 1036 09/19/21 1437   09/16/21 2300  vancomycin (VANCOCIN) IVPB 1000 mg/200 mL premix  Status:  Discontinued       See Hyperspace for full Linked Orders Report.   1,000 mg 200 mL/hr over 60 Minutes Intravenous Every 24 hours 09/15/21 2157 09/17/21 1027   09/15/21 2300  acyclovir (ZOVIRAX) 485 mg in dextrose 5 % 100 mL IVPB  Status:  Discontinued        10 mg/kg  48.4 kg 109.7 mL/hr over 60 Minutes Intravenous Every 12 hours 09/15/21 2146 09/17/21 1038   09/15/21 2300  cefTRIAXone (ROCEPHIN) 2 g in sodium chloride 0.9 % 100 mL IVPB        2 g 200 mL/hr over 30 Minutes Intravenous Every 12 hours 09/15/21 2146     09/15/21 2300  ampicillin (OMNIPEN) 2 g in sodium chloride 0.9 % 100 mL IVPB  Status:  Discontinued        2 g 300 mL/hr over 20 Minutes Intravenous Every 6 hours 09/15/21 2206 09/17/21 1036   09/15/21 2245  vancomycin (VANCOREADY) IVPB 1250 mg/250 mL       See Hyperspace for full Linked Orders Report.   1,250 mg 166.7 mL/hr over 90 Minutes Intravenous  Once 09/15/21 2157 09/16/21 1911   09/08/21 1200  cefTRIAXone (ROCEPHIN) 2 g in sodium chloride 0.9 % 100 mL IVPB        2 g 200 mL/hr over 30 Minutes Intravenous Every 24 hours 09/07/21 1506 09/13/21 1447   09/07/21 2200  metroNIDAZOLE (FLAGYL) IVPB 500 mg   Status:  Discontinued        500 mg 100 mL/hr over 60 Minutes Intravenous Every 12 hours 09/07/21 1506 09/08/21 1407   09/07/21 1445  metroNIDAZOLE (FLAGYL) IVPB 500 mg        500 mg 100 mL/hr over 60 Minutes Intravenous  Once 09/07/21 1435 09/07/21 1545   09/07/21 1230  cefTRIAXone (ROCEPHIN) 1 g in sodium chloride 0.9 % 100 mL IVPB        1 g 200 mL/hr over 30 Minutes Intravenous  Once 09/07/21 1217 09/07/21 1320   09/07/21 1230  azithromycin (ZITHROMAX) 500 mg in sodium chloride 0.9 % 250 mL IVPB        500 mg 250 mL/hr over 60 Minutes Intravenous  Once 09/07/21 1217 09/07/21 1424       Medications: Scheduled Meds:  apixaban  5 mg Oral BID   atorvastatin  40 mg Per Tube Daily   feeding supplement (PROSource TF)  45 mL Per Tube BID   free water  200 mL Per Tube Q4H  lisinopril  40 mg Per Tube Daily   mouth rinse  15 mL Mouth Rinse BID   methimazole  5 mg Per Tube Daily   pantoprazole (PROTONIX) IV  40 mg Intravenous Q24H   spironolactone  50 mg Per Tube Daily   Continuous Infusions:  sodium chloride Stopped (09/19/21 1424)   acyclovir 500 mg (09/19/21 1539)   cefTRIAXone (ROCEPHIN)  IV 2 g (09/19/21 1537)   dextrose Stopped (09/19/21 1427)   feeding supplement (OSMOLITE 1.5 CAL) Stopped (09/18/21 2300)   PRN Meds:.sodium chloride, albuterol, ondansetron **OR** ondansetron (ZOFRAN) IV, oxyCODONE-acetaminophen    Objective: Weight change: -0.615 kg  Intake/Output Summary (Last 24 hours) at 09/19/2021 1719 Last data filed at 09/19/2021 1556 Gross per 24 hour  Intake 3498.51 ml  Output 1850 ml  Net 1648.51 ml   Blood pressure (!) 132/51, pulse (!) 53, temperature 98 F (36.7 C), temperature source Oral, resp. rate 19, height 5\' 10"  (1.778 m), weight 50.5 kg, SpO2 93 %. Temp:  [97.7 F (36.5 C)-99.3 F (37.4 C)] 98 F (36.7 C) (03/13 1556) Pulse Rate:  [52-69] 53 (03/13 1556) Resp:  [17-22] 19 (03/13 1556) BP: (98-141)/(41-59) 132/51 (03/13 1556) SpO2:  [93 %-97  %] 93 % (03/13 1556) Weight:  [50.5 kg] 50.5 kg (03/13 0459)  Physical Exam: Physical Exam Vitals reviewed.  Constitutional:      Appearance: Joy Clark is ill-appearing.  HENT:     Head: Normocephalic and atraumatic.  Cardiovascular:     Rate and Rhythm: Bradycardia present. Rhythm irregular.     Heart sounds: No murmur heard.   No friction rub. No gallop.  Pulmonary:     Effort: Pulmonary effort is normal. No respiratory distress.     Breath sounds: No stridor. No wheezing or rhonchi.  Abdominal:     General: Bowel sounds are normal. There is no distension.     Palpations: There is no mass.     Tenderness: There is no abdominal tenderness.     Hernia: No hernia is present.  Musculoskeletal:        General: Normal range of motion.  Skin:    Coloration: Skin is pale.     Findings: No rash.  Neurological:     General: No focal deficit present.     Mental Status: Joy Clark is alert.  Psychiatric:        Mood and Affect: Mood is depressed.        Speech: Speech is delayed.        Behavior: Behavior is cooperative.        Cognition and Memory: Cognition is not impaired. Memory is not impaired. Joy Clark does not exhibit impaired recent memory or impaired remote memory.    Joy Clark knew Joy Clark was from [facility] and had relatives in [place], [place] but was surprise Joy Clark was in the hospital  Joy Clark did have NG when I visited her  CBC:    BMET Recent Labs    09/18/21 0424 09/19/21 0536  NA 133* 135  K 4.1 4.0  CL 101 102  CO2 27 27  GLUCOSE 126* 113*  BUN 21 20  CREATININE 0.58 0.76  CALCIUM 8.0* 8.0*     Liver Panel  No results for input(s): PROT, ALBUMIN, AST, ALT, ALKPHOS, BILITOT, BILIDIR, IBILI in the last 72 hours.     Sedimentation Rate No results for input(s): ESRSEDRATE in the last 72 hours. C-Reactive Protein No results for input(s): CRP in the last 72 hours.  Micro Results: Recent Results (  from the past 720 hour(s))  Blood culture (single)     Status: None   Collection  Time: 09/07/21 11:09 AM   Specimen: BLOOD LEFT ARM  Result Value Ref Range Status   Specimen Description BLOOD LEFT ARM  Final   Special Requests   Final    BOTTLES DRAWN AEROBIC AND ANAEROBIC Blood Culture adequate volume   Culture   Final    NO GROWTH 5 DAYS Performed at [facility], [address] [place], [place] [address]    Report Status 09/12/2021 FINAL  Final  Resp Panel by RT-PCR (Flu A&B, Covid) Nasopharyngeal Swab     Status: None   Collection Time: 09/07/21 11:09 AM   Specimen: Nasopharyngeal Swab; Nasopharyngeal(NP) swabs in vial transport medium  Result Value Ref Range Status   SARS Coronavirus 2 by RT PCR NEGATIVE NEGATIVE Final    Comment: (NOTE) SARS-CoV-2 target nucleic acids are NOT DETECTED.  The SARS-CoV-2 RNA is generally detectable in upper respiratory specimens during the acute phase of infection. The lowest concentration of SARS-CoV-2 viral copies this assay can detect is 138 copies/mL. A negative result does not preclude SARS-Cov-2 infection and should not be used as the sole basis for treatment or other patient management decisions. A negative result may occur with  improper specimen collection/handling, submission of specimen other than nasopharyngeal swab, presence of viral mutation(s) within the areas targeted by this assay, and inadequate number of viral copies(<138 copies/mL). A negative result must be combined with clinical observations, patient history, and epidemiological information. The expected result is Negative.  Fact Sheet for Patients:  BloggerCourse.com  Fact Sheet for Healthcare Providers:  SeriousBroker.it  This test is no t yet approved or cleared by the [place] FDA and  has been authorized for detection and/or diagnosis of SARS-CoV-2 by FDA under an Emergency Use Authorization (EUA). This EUA will remain  in effect (meaning this test can be used) for the duration  of the COVID-19 declaration under Section 564(b)(1) of the Act, 21 U.S.C.section 360bbb-3(b)(1), unless the authorization is terminated  or revoked sooner.       Influenza A by PCR NEGATIVE NEGATIVE Final   Influenza B by PCR NEGATIVE NEGATIVE Final    Comment: (NOTE) The Xpert Xpress SARS-CoV-2/FLU/RSV plus assay is intended as an aid in the diagnosis of influenza from Nasopharyngeal swab specimens and should not be used as a sole basis for treatment. Nasal washings and aspirates are unacceptable for Xpert Xpress SARS-CoV-2/FLU/RSV testing.  Fact Sheet for Patients: BloggerCourse.com  Fact Sheet for Healthcare Providers: SeriousBroker.it  This test is not yet approved or cleared by the [place] FDA and has been authorized for detection and/or diagnosis of SARS-CoV-2 by FDA under an Emergency Use Authorization (EUA). This EUA will remain in effect (meaning this test can be used) for the duration of the COVID-19 declaration under Section 564(b)(1) of the Act, 21 U.S.C. section 360bbb-3(b)(1), unless the authorization is terminated or revoked.  Performed at [facility], [address] [place], [place] [address]   Urine Culture     Status: Abnormal   Collection Time: 09/07/21 11:23 AM   Specimen: Urine, Clean Catch  Result Value Ref Range Status   Specimen Description   Final    URINE, CLEAN CATCH Performed at [facility], [address] [place], [place] [address]    Special Requests   Final    NONE Performed at [facility], [address], [place], [place] [address]  Culture >=100,000 COLONIES/mL ESCHERICHIA COLI (A)  Final   Report Status 09/11/2021 FINAL  Final   Organism ID, Bacteria ESCHERICHIA COLI (A)  Final      Susceptibility   Escherichia coli - MIC*    AMPICILLIN >=32 RESISTANT Resistant     CEFAZOLIN <=4 SENSITIVE Sensitive     CEFEPIME <=0.12 SENSITIVE Sensitive      CEFTRIAXONE <=0.25 SENSITIVE Sensitive     CIPROFLOXACIN <=0.25 SENSITIVE Sensitive     GENTAMICIN <=1 SENSITIVE Sensitive     IMIPENEM <=0.25 SENSITIVE Sensitive     NITROFURANTOIN <=16 SENSITIVE Sensitive     TRIMETH/SULFA <=20 SENSITIVE Sensitive     AMPICILLIN/SULBACTAM >=32 RESISTANT Resistant     PIP/TAZO <=4 SENSITIVE Sensitive     * >=100,000 COLONIES/mL ESCHERICHIA COLI  C Difficile Quick Screen w PCR reflex     Status: None   Collection Time: 09/07/21  7:21 PM   Specimen: STOOL  Result Value Ref Range Status   C Diff antigen NEGATIVE NEGATIVE Final   C Diff toxin NEGATIVE NEGATIVE Final   C Diff interpretation No C. difficile detected.  Final    Comment: Performed at [facility], [address] [place], [place] [address]  Gastrointestinal Panel by PCR , Stool     Status: None   Collection Time: 09/07/21  7:21 PM   Specimen: STOOL  Result Value Ref Range Status   Campylobacter species NOT DETECTED NOT DETECTED Final   Plesimonas shigelloides NOT DETECTED NOT DETECTED Final   Salmonella species NOT DETECTED NOT DETECTED Final   Yersinia enterocolitica NOT DETECTED NOT DETECTED Final   Vibrio species NOT DETECTED NOT DETECTED Final   Vibrio cholerae NOT DETECTED NOT DETECTED Final   Enteroaggregative E coli (EAEC) NOT DETECTED NOT DETECTED Final   Enteropathogenic E coli (EPEC) NOT DETECTED NOT DETECTED Final   Enterotoxigenic E coli (ETEC) NOT DETECTED NOT DETECTED Final   Shiga like toxin producing E coli (STEC) NOT DETECTED NOT DETECTED Final   Shigella/Enteroinvasive E coli (EIEC) NOT DETECTED NOT DETECTED Final   Cryptosporidium NOT DETECTED NOT DETECTED Final   Cyclospora cayetanensis NOT DETECTED NOT DETECTED Final   Entamoeba histolytica NOT DETECTED NOT DETECTED Final   Giardia lamblia NOT DETECTED NOT DETECTED Final   Adenovirus F40/41 NOT DETECTED NOT DETECTED Final   Astrovirus NOT DETECTED NOT DETECTED Final   Norovirus GI/GII NOT DETECTED NOT  DETECTED Final   Rotavirus A NOT DETECTED NOT DETECTED Final   Sapovirus (I, II, IV, and V) NOT DETECTED NOT DETECTED Final    Comment: Performed at [facility], [address], [place], [place] [address]  Urine Culture     Status: Abnormal   Collection Time: 09/15/21  5:40 PM   Specimen: Urine, Clean Catch  Result Value Ref Range Status   Specimen Description   Final    URINE, CLEAN CATCH Performed at [facility], [address] [place], [place] [address]    Special Requests   Final    NONE Performed at [facility], [address], [place], [place] [address]    Culture (A)  Final    <10,000 COLONIES/mL INSIGNIFICANT GROWTH Performed at [facility], [address] [place], [place] [address]    Report Status 09/17/2021 FINAL  Final  CSF culture w Gram Stain     Status: None (Preliminary result)   Collection Time: 09/19/21 12:29 PM   Specimen: PATH Cytology CSF; Cerebrospinal Fluid  Result Value Ref  Range Status   Specimen Description CSF  Final   Special Requests NONE  Final   Gram Stain   Final    WBC SEEN RED BLOOD CELLS NO ORGANISMS SEEN Performed at [facility], [address] [place], [place] [address]    Culture PENDING  Incomplete   Report Status PENDING  Incomplete    Studies/Results: IR FL GUIDED LOC OF NEEDL/CATH TIP FOR SPINAL INJECT LT  Result Date: 09/19/2021 CLINICAL DATA:  Acute metabolic encephalopathy EXAM: Lumbar puncture using fluoroscopic guidance ANESTHESIA/SEDATION: Local analgesia MEDICATIONS: None CONTRAST:  None FLUOROSCOPY TIME: FLUOROSCOPY TIME 0.8 minutes (6 mGy) PROCEDURE: The patient was placed in the lateral decubitus position on the exam table. Skin entry site was marked using fluoroscopic guidance. The overlying skin was prepped and draped in the standard sterile fashion. Local analgesia was obtained with 1% lidocaine. Using fluoroscopic guidance, 20 gauge spinal needle was advanced into the  subarachnoid space at L2-L3 using a midline approach under fluoroscopic guidance. There was spontaneous return of clear, colorless CSF. Opening pressure was estimated at 8 cm H2O. Approximately 8 mL of CSF was collected and sent to the lab for analysis. Needle was removed and hemostasis was achieved the access site using manual pressure. Clean dressing was placed. The patient tolerated the procedure well without immediate complication. COMPLICATIONS: None immediate FINDINGS: See above. IMPRESSION: Successful lumbar puncture at L2-L3 using fluoroscopic guidance. Opening pressure was estimated at 8 cm H2O. A sample of CSF was sent to the lab for analysis. Electronically Signed   By: Olive Bass M.D.   On: 09/19/2021 13:39      Assessment/Plan:  INTERVAL HISTORY:   Patient is status post lumbar puncture    Principal Problem:   Acute metabolic encephalopathy Active Problems:   Enterocolitis   UTI (urinary tract infection)   Hypokalemia   Abnormal CT scan, gallbladder   Altered mental status   Colitis   Dehydration   Gallbladder hydrops   Hyperthyroidism   Nausea vomiting and diarrhea   RUQ pain   Atrial fibrillation with RVR (HCC)   Hypernatremia   Aspiration into airway   Malnutrition of moderate degree   Dysphagia    Joy Clark is a 71 y.o. female with history of smoking fibromyalgia hyperlipidemia prior C. difficile infection who was admitted on March 1 with confusion for 4 days with along with chills and bouts of nausea and vomiting and diarrhea.  CT head was unrevealing CT abdomen pelvis showed concern for infectious enterocolitis.  Patient's course complicated by atrial fibrillation with rapid ventricular response.  Joy Clark initially been treated also with ceftriaxone with the idea that Joy Clark might have a urinary tract infection with confusion.  Confusion persisted and leukocytosis worsened.  Joy Clark was then broadened to cover for bacterial meningitis with vancomycin and  ampicillin with continued ceftriaxone but with meningitis dosing intervals and the addition of acyclovir.  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  There is no leptomeningeal enhancement no evidence of abnormalities of either temporal lobe.  Today Joy Clark underwent lumbar puncture on this her 13th day of antimicrobial therapy.  Her lumbar puncture CSF analysis showed 7 white blood cells with 44% lymphocytes and 56% monocytes glucose of 64 and protein of 45.  Her procalcitonin's have all been normal as well.  I am highly skeptical that Joy Clark has been suffering from a bacterial meningoencephalitis given the  relatively normal MRI of the brain with no leptomeningeal enhancement and rather unremarkable CSF analysis although 13 days into antimicrobial therapy.  I am stopping her ampicillin today and will continue ceftriaxone for now but likely discontinue it tomorrow.  Her CSF is been sent for herpes simplex by PCR and certainly acyclovir should be discontinued if this is negative and I would be shocked if it were positive given her normal MRI of the brain.  Not clear what caused her fevers and also encephalopathy but it does not appear to be a pyogenic source.  Fungal and tuberculous meningitis would not present this way either as her presentation is fairly acute rather than a subacute 1.  Certainly a viral process could certainly provide a unifying diagnosis possibly particular in light of her colitis and enterovirus meningitis certainly could be possible.  I spent 36 minutes with the patient including than 50% of the time in face to face counseling the  patient  personally reviewing MRIs of the brain with and without contrast CT scans as well as updated CSF analysis and cultures and serologies along with review of medical records in preparation for the visit and during the visit  and in coordination of her care.       LOS: 12 days   Acey Lav 09/19/2021, 5:19 PM

## 2021-09-19 NOTE — Progress Notes (Signed)
ANTICOAGULATION CONSULT NOTE  ? ?Pharmacy Consult for apixaban ?Indication: atrial fibrillation ? ?Not on File ? ?Patient Measurements: ?Height: 5\' 10"  (177.8 cm) ?Weight: 50.5 kg (111 lb 4.8 oz) ?IBW/kg (Calculated) : 68.5 ?Heparin Dosing Weight: 49.2 kg ? ?Vital Signs: ?Temp: 98 ?F (36.7 ?C) (03/13 1146) ?Temp Source: Oral (03/13 0459) ?BP: 98/41 (03/13 1241) ?Pulse Rate: 52 (03/13 1241) ? ?Labs: ?Recent Labs  ?  09/17/21 ?0611 09/17/21 ?1339 09/17/21 ?1546 09/18/21 ?0424 09/19/21 ?[id number]  ?HGB 14.4  --   --  12.2 11.2*  ?HCT 43.1  --   --  35.8* 33.5*  ?PLT 260  --   --  236 244  ?APTT 90* >160* 88* 98*  --   ?HEPARINUNFRC 0.95*  --   --  0.63 0.42  ?CREATININE 0.66  --   --  0.58 0.76  ? ? ? ?Estimated Creatinine Clearance: 51.4 mL/min (by C-G formula based on SCr of 0.76 mg/dL). ? ? ?Medical History: ?Past Medical History:  ?Diagnosis Date  ? Fibromyalgia   ? ? ?Medications:  ?Medications Prior to Admission  ?Medication Sig Dispense Refill Last Dose  ? albuterol (VENTOLIN HFA) 108 (90 Base) MCG/ACT inhaler Inhale into the lungs every 6 (six) hours as needed for wheezing or shortness of breath.   Past Week at unknown prn  ? atorvastatin (LIPITOR) 40 MG tablet Take 40 mg by mouth daily.   Past Week  ? carboxymethylcellulose 1 % ophthalmic solution 1 drop 3 (three) times daily.   Past Week  ? celecoxib (CELEBREX) 200 MG capsule Take 200 mg by mouth 2 (two) times daily.   Past Week  ? cilostazol (PLETAL) 100 MG tablet Take 100 mg by mouth 2 (two) times daily.   Past Week  ? fluticasone (FLONASE) 50 MCG/ACT nasal spray Place into both nostrils daily.   Past Week at unknown prn  ? gabapentin (NEURONTIN) 800 MG tablet Take 800 mg by mouth 3 (three) times daily.   Past Week  ? omeprazole (PRILOSEC) 40 MG capsule Take 40 mg by mouth daily.   Past Week  ? ondansetron (ZOFRAN) 8 MG tablet Take by mouth every 8 (eight) hours as needed for nausea or vomiting.   Past Week at unknown prn  ? oxyCODONE-acetaminophen (PERCOCET)  10-325 MG tablet Take 1 tablet by mouth every 4 (four) hours as needed for pain.   Past Week  ? sodium chloride (OCEAN) 0.65 % SOLN nasal spray Place 1 spray into both nostrils as needed for congestion.   Past Week at unknown prn  ? ?Scheduled:  ? apixaban  5 mg Oral BID  ? atorvastatin  40 mg Per Tube Daily  ? feeding supplement (PROSource TF)  45 mL Per Tube BID  ? free water  200 mL Per Tube Q4H  ? lisinopril  40 mg Per Tube Daily  ? mouth rinse  15 mL Mouth Rinse BID  ? methimazole  5 mg Per Tube Daily  ? pantoprazole (PROTONIX) IV  40 mg Intravenous Q24H  ? spironolactone  50 mg Per Tube Daily  ? ?Infusions:  ? sodium chloride Stopped (09/19/21 1424)  ? acyclovir Stopped (09/19/21 [id number])  ? cefTRIAXone (ROCEPHIN)  IV 2 g (09/19/21 1537)  ? dextrose Stopped (09/19/21 1427)  ? feeding supplement (OSMOLITE 1.5 CAL) Stopped (09/18/21 2300)  ? sodium chloride    ? ?PRN: sodium chloride, albuterol, ondansetron **OR** ondansetron (ZOFRAN) IV, oxyCODONE-acetaminophen ?Anti-infectives (From admission, onward)  ? ? Start     Dose/Rate Route Frequency  Ordered Stop  ? 09/17/21 2200  acyclovir (ZOVIRAX) 500 mg in dextrose 5 % 100 mL IVPB  Status:  Discontinued       ? 500 mg ?110 mL/hr over 60 Minutes Intravenous Every 8 hours 09/17/21 1038 09/17/21 1050  ? 09/17/21 1400  acyclovir (ZOVIRAX) 500 mg in dextrose 5 % 100 mL IVPB       ? 500 mg ?110 mL/hr over 60 Minutes Intravenous Every 8 hours 09/17/21 1050    ? 09/17/21 1200  ampicillin (OMNIPEN) 2 g in sodium chloride 0.9 % 100 mL IVPB  Status:  Discontinued       ? 2 g ?300 mL/hr over 20 Minutes Intravenous Every 4 hours 09/17/21 1036 09/19/21 1437  ? 09/16/21 2300  vancomycin (VANCOCIN) IVPB 1000 mg/200 mL premix  Status:  Discontinued       ?See Hyperspace for full Linked Orders Report.  ? 1,000 mg ?200 mL/hr over 60 Minutes Intravenous Every 24 hours 09/15/21 2157 09/17/21 1027  ? 09/15/21 2300  acyclovir (ZOVIRAX) 485 mg in dextrose 5 % 100 mL IVPB  Status:   Discontinued       ? 10 mg/kg ? 48.4 kg ?109.7 mL/hr over 60 Minutes Intravenous Every 12 hours 09/15/21 2146 09/17/21 1038  ? 09/15/21 2300  cefTRIAXone (ROCEPHIN) 2 g in sodium chloride 0.9 % 100 mL IVPB       ? 2 g ?200 mL/hr over 30 Minutes Intravenous Every 12 hours 09/15/21 2146    ? 09/15/21 2300  ampicillin (OMNIPEN) 2 g in sodium chloride 0.9 % 100 mL IVPB  Status:  Discontinued       ? 2 g ?300 mL/hr over 20 Minutes Intravenous Every 6 hours 09/15/21 2206 09/17/21 1036  ? 09/15/21 2245  vancomycin (VANCOREADY) IVPB 1250 mg/250 mL       ?See Hyperspace for full Linked Orders Report.  ? 1,250 mg ?166.7 mL/hr over 90 Minutes Intravenous  Once 09/15/21 2157 09/16/21 1911  ? 09/08/21 1200  cefTRIAXone (ROCEPHIN) 2 g in sodium chloride 0.9 % 100 mL IVPB       ? 2 g ?200 mL/hr over 30 Minutes Intravenous Every 24 hours 09/07/21 1506 09/13/21 1447  ? 09/07/21 2200  metroNIDAZOLE (FLAGYL) IVPB 500 mg  Status:  Discontinued       ? 500 mg ?100 mL/hr over 60 Minutes Intravenous Every 12 hours 09/07/21 1506 09/08/21 1407  ? 09/07/21 1445  metroNIDAZOLE (FLAGYL) IVPB 500 mg       ? 500 mg ?100 mL/hr over 60 Minutes Intravenous  Once 09/07/21 1435 09/07/21 1545  ? 09/07/21 1230  cefTRIAXone (ROCEPHIN) 1 g in sodium chloride 0.9 % 100 mL IVPB       ? 1 g ?200 mL/hr over 30 Minutes Intravenous  Once 09/07/21 1217 09/07/21 1320  ? 09/07/21 1230  azithromycin (ZITHROMAX) 500 mg in sodium chloride 0.9 % 250 mL IVPB       ? 500 mg ?250 mL/hr over 60 Minutes Intravenous  Once 09/07/21 1217 09/07/21 1424  ? ?  ? ? ?Assessment: ?70YOF PMH fibromyalgia, nicotine dependence, chronic pain syndrome and dyslipidemia who presented with AMS over past 4 days. Encephalopathic, now with new c/f meningitis/encephalitis. ? ?LP completed 3/13 AM ? ?Goal of Therapy:  ?Heparin level 0.3-0.7 units/ml ?aPTT 66-102 seconds ?Monitor platelets by anticoagulation protocol: Yes ?  ?Plan:  ?Per Dr. Jimmye Norman (per neurology) restart apixaban 5 mg BID  6-8 hours after LP. ? ?Wynelle Cleveland, PharmD ?Pharmacy  Resident  ?09/19/2021 ?3:39 PM ? ? ?

## 2021-09-19 NOTE — Progress Notes (Signed)
ANTICOAGULATION CONSULT NOTE  ? ?Pharmacy Consult for IV heparin ?Indication: atrial fibrillation ? ?Not on File ? ?Patient Measurements: ?Height: 5\' 10"  (177.8 cm) ?Weight: 51.1 kg (112 lb 10.5 oz) ?IBW/kg (Calculated) : 68.5 ?Heparin Dosing Weight: 49.2 kg ? ?Vital Signs: ?Temp: 98.3 ?F (36.8 ?C) (03/12 2356) ?Temp Source: Oral (03/12 2356) ?BP: 107/43 (03/12 2356) ?Pulse Rate: 69 (03/12 2356) ? ?Labs: ?Recent Labs  ?  09/16/21 ?0431 09/16/21 ?0922 09/16/21 ?1817 09/17/21 ?[id number] 09/17/21 ?1339 09/17/21 ?1546 09/18/21 ?0424  ?HGB 15.6*  --   --  14.4  --   --  12.2  ?HCT 47.9*  --   --  43.1  --   --  35.8*  ?PLT 240  --   --  260  --   --  236  ?APTT  --  28   < > 90* >160* 88* 98*  ?HEPARINUNFRC  --  >1.10*  --  0.95*  --   --  0.63  ?CREATININE 0.65  --   --  0.66  --   --  0.58  ? < > = values in this interval not displayed.  ? ? ? ?Estimated Creatinine Clearance: 52 mL/min (by C-G formula based on SCr of 0.58 mg/dL). ? ? ?Medical History: ?Past Medical History:  ?Diagnosis Date  ? Fibromyalgia   ? ? ?Medications:  ?Medications Prior to Admission  ?Medication Sig Dispense Refill Last Dose  ? albuterol (VENTOLIN HFA) 108 (90 Base) MCG/ACT inhaler Inhale into the lungs every 6 (six) hours as needed for wheezing or shortness of breath.   Past Week at unknown prn  ? atorvastatin (LIPITOR) 40 MG tablet Take 40 mg by mouth daily.   Past Week  ? carboxymethylcellulose 1 % ophthalmic solution 1 drop 3 (three) times daily.   Past Week  ? celecoxib (CELEBREX) 200 MG capsule Take 200 mg by mouth 2 (two) times daily.   Past Week  ? cilostazol (PLETAL) 100 MG tablet Take 100 mg by mouth 2 (two) times daily.   Past Week  ? fluticasone (FLONASE) 50 MCG/ACT nasal spray Place into both nostrils daily.   Past Week at unknown prn  ? gabapentin (NEURONTIN) 800 MG tablet Take 800 mg by mouth 3 (three) times daily.   Past Week  ? omeprazole (PRILOSEC) 40 MG capsule Take 40 mg by mouth daily.   Past Week  ? ondansetron (ZOFRAN) 8 MG  tablet Take by mouth every 8 (eight) hours as needed for nausea or vomiting.   Past Week at unknown prn  ? oxyCODONE-acetaminophen (PERCOCET) 10-325 MG tablet Take 1 tablet by mouth every 4 (four) hours as needed for pain.   Past Week  ? sodium chloride (OCEAN) 0.65 % SOLN nasal spray Place 1 spray into both nostrils as needed for congestion.   Past Week at unknown prn  ? ?Scheduled:  ? atorvastatin  40 mg Per Tube Daily  ? carvedilol  25 mg Per Tube BID WC  ? feeding supplement (PROSource TF)  45 mL Per Tube BID  ? free water  200 mL Per Tube Q4H  ? lisinopril  40 mg Per Tube Daily  ? methimazole  5 mg Per Tube Daily  ? pantoprazole (PROTONIX) IV  40 mg Intravenous Q24H  ? spironolactone  50 mg Per Tube Daily  ? ?Infusions:  ? sodium chloride 10 mL/hr at 09/18/21 2330  ? acyclovir Stopped (09/18/21 2227)  ? ampicillin (OMNIPEN) IV 2 g (09/18/21 2344)  ? cefTRIAXone (ROCEPHIN)  IV Stopped (09/18/21 1545)  ? dextrose 50 mL/hr at 09/19/21 0116  ? feeding supplement (OSMOLITE 1.5 CAL) Stopped (09/18/21 2300)  ? heparin 900 Units/hr (09/18/21 2330)  ? ?PRN: sodium chloride, albuterol, ondansetron **OR** ondansetron (ZOFRAN) IV, oxyCODONE-acetaminophen ?Anti-infectives (From admission, onward)  ? ? Start     Dose/Rate Route Frequency Ordered Stop  ? 09/17/21 2200  acyclovir (ZOVIRAX) 500 mg in dextrose 5 % 100 mL IVPB  Status:  Discontinued       ? 500 mg ?110 mL/hr over 60 Minutes Intravenous Every 8 hours 09/17/21 1038 09/17/21 1050  ? 09/17/21 1400  acyclovir (ZOVIRAX) 500 mg in dextrose 5 % 100 mL IVPB       ? 500 mg ?110 mL/hr over 60 Minutes Intravenous Every 8 hours 09/17/21 1050    ? 09/17/21 1200  ampicillin (OMNIPEN) 2 g in sodium chloride 0.9 % 100 mL IVPB       ? 2 g ?300 mL/hr over 20 Minutes Intravenous Every 4 hours 09/17/21 1036    ? 09/16/21 2300  vancomycin (VANCOCIN) IVPB 1000 mg/200 mL premix  Status:  Discontinued       ?See Hyperspace for full Linked Orders Report.  ? 1,000 mg ?200 mL/hr over 60  Minutes Intravenous Every 24 hours 09/15/21 2157 09/17/21 1027  ? 09/15/21 2300  acyclovir (ZOVIRAX) 485 mg in dextrose 5 % 100 mL IVPB  Status:  Discontinued       ? 10 mg/kg ? 48.4 kg ?109.7 mL/hr over 60 Minutes Intravenous Every 12 hours 09/15/21 2146 09/17/21 1038  ? 09/15/21 2300  cefTRIAXone (ROCEPHIN) 2 g in sodium chloride 0.9 % 100 mL IVPB       ? 2 g ?200 mL/hr over 30 Minutes Intravenous Every 12 hours 09/15/21 2146    ? 09/15/21 2300  ampicillin (OMNIPEN) 2 g in sodium chloride 0.9 % 100 mL IVPB  Status:  Discontinued       ? 2 g ?300 mL/hr over 20 Minutes Intravenous Every 6 hours 09/15/21 2206 09/17/21 1036  ? 09/15/21 2245  vancomycin (VANCOREADY) IVPB 1250 mg/250 mL       ?See Hyperspace for full Linked Orders Report.  ? 1,250 mg ?166.7 mL/hr over 90 Minutes Intravenous  Once 09/15/21 2157 09/16/21 1911  ? 09/08/21 1200  cefTRIAXone (ROCEPHIN) 2 g in sodium chloride 0.9 % 100 mL IVPB       ? 2 g ?200 mL/hr over 30 Minutes Intravenous Every 24 hours 09/07/21 1506 09/13/21 1447  ? 09/07/21 2200  metroNIDAZOLE (FLAGYL) IVPB 500 mg  Status:  Discontinued       ? 500 mg ?100 mL/hr over 60 Minutes Intravenous Every 12 hours 09/07/21 1506 09/08/21 1407  ? 09/07/21 1445  metroNIDAZOLE (FLAGYL) IVPB 500 mg       ? 500 mg ?100 mL/hr over 60 Minutes Intravenous  Once 09/07/21 1435 09/07/21 1545  ? 09/07/21 1230  cefTRIAXone (ROCEPHIN) 1 g in sodium chloride 0.9 % 100 mL IVPB       ? 1 g ?200 mL/hr over 30 Minutes Intravenous  Once 09/07/21 1217 09/07/21 1320  ? 09/07/21 1230  azithromycin (ZITHROMAX) 500 mg in sodium chloride 0.9 % 250 mL IVPB       ? 500 mg ?250 mL/hr over 60 Minutes Intravenous  Once 09/07/21 1217 09/07/21 1424  ? ?  ? ? ?Assessment: ?70YOF PMH fibromyalgia, nicotine dependence, chronic pain syndrome and dyslipidemia who presented with AMS over past 4 days. Encephalopathic, now  with new c/f meningitis/encephalitis. ? ?Switching to heparin in order to obtain LP. Planning to hold IV heparin x  4 hours prior to LP and restart 2 hours after LP. ? ?Goal of Therapy:  ?Heparin level 0.3-0.7 units/ml ?aPTT 66-102 seconds ?Monitor platelets by anticoagulation protocol: Yes ?  ?Plan:  ?Heparin level remains therapeutic: continue heparin infusion at 900 units/hr until 0700 (holding for LP) ?Follow restart progress for further lab-related requirements ?Daily CBC ?Next HL with AM labs. ? ?Vallery Sa, PharmD, BCPS ?09/19/2021 ?1:28 AM ? ? ? ? ? ?

## 2021-09-19 NOTE — Progress Notes (Signed)
Subjective: Awake, lying in bed comfortably. Underwent LP today  LP results:  WBC 7 RBC 2 Protein 45 Glucose 64 Crypto antigen - neg Pending: culture, HSV, VZV, CMV, IgG index  Objective: Current vital signs: BP (!) 132/51 (BP Location: Right Arm)    Pulse (!) 53    Temp 98 F (36.7 C) (Oral)    Resp 19    Ht 5\' 10"  (1.778 m)    Wt 50.5 kg    SpO2 93%    BMI 15.97 kg/m  Vital signs in last 24 hours: Temp:  [97.7 F (36.5 C)-99.3 F (37.4 C)] 98 F (36.7 C) (03/13 1556) Pulse Rate:  [52-69] 53 (03/13 1556) Resp:  [17-22] 19 (03/13 1556) BP: (98-141)/(41-59) 132/51 (03/13 1556) SpO2:  [93 %-97 %] 93 % (03/13 1556) Weight:  [50.5 kg] 50.5 kg (03/13 0459)  Intake/Output from previous day: 03/12 0701 - 03/13 0700 In: 2842.1 [I.V.:695.2; IV Piggyback:2146.9] Out: 1200 [Urine:1200] Intake/Output this shift: No intake/output data recorded. Nutritional status:  Diet Order             DIET DYS 3 Room service appropriate? Yes; Fluid consistency: Thin  Diet effective now                   HEENT: [place]/AT Lungs: Respirations unlabored. Tachypnea has resolved. Ext: No edema   Neurologic Exam: Mental Status: Awake and not following commands today, but did answer "yes" to multiple questions. Tracks examiner briskly. Cranial Nerves: II: Glances at examiner at times; does so more purposefully than yesterday.   III,IV, VI: No ptosis. Eyes are conjugate. Will track examiner to left and right as she walks from side to side. No nystagmus.  VII: Face is symmetric.  VIII: Will glance towards examiner in response to loud calling of her name  XI: Head is midline XII: Does not protrude tongue. Motor/Sensory: BUE. Some movement anti-gravity BUE (does not follow commands but will occasionally mimic) BLE: Purposefully moving BLE today. Moves in response to noxious stimuli bilaterally.   Diffusely decreased muscle bulk in all 4 extremities. Decreased tone in BUE.  Does not follow any  commands for motor testing.  Cerebellar/Gait: Unable to assess  Lab Results: Results for orders placed or performed during the hospital encounter of 09/07/21 (from the past 48 hour(s))  Glucose, capillary     Status: Abnormal   Collection Time: 09/17/21  8:01 PM  Result Value Ref Range   Glucose-Capillary 122 (H) 70 - 99 mg/dL    Comment: Glucose reference range applies only to samples taken after fasting for at least 8 hours.  Glucose, capillary     Status: Abnormal   Collection Time: 09/17/21 11:47 PM  Result Value Ref Range   Glucose-Capillary 130 (H) 70 - 99 mg/dL    Comment: Glucose reference range applies only to samples taken after fasting for at least 8 hours.  CBC     Status: Abnormal   Collection Time: 09/18/21  4:24 AM  Result Value Ref Range   WBC 27.9 (H) 4.0 - 10.5 K/uL   RBC 3.82 (L) 3.87 - 5.11 MIL/uL   Hemoglobin 12.2 12.0 - 15.0 g/dL   HCT 11/18/21 (L) 99.2 - 42.6 %   MCV 93.7 80.0 - 100.0 fL   MCH 31.9 26.0 - 34.0 pg   MCHC 34.1 30.0 - 36.0 g/dL   RDW 83.4 19.6 - 22.2 %   Platelets 236 150 - 400 K/uL   nRBC 0.0 0.0 - 0.2 %  Comment: Performed at [facility], [address] [place], [place] [address]  Basic metabolic panel     Status: Abnormal   Collection Time: 09/18/21  4:24 AM  Result Value Ref Range   Sodium 133 (L) 135 - 145 mmol/L   Potassium 4.1 3.5 - 5.1 mmol/L   Chloride 101 98 - 111 mmol/L   CO2 27 22 - 32 mmol/L   Glucose, Bld 126 (H) 70 - 99 mg/dL    Comment: Glucose reference range applies only to samples taken after fasting for at least 8 hours.   BUN 21 8 - 23 mg/dL   Creatinine, Ser 6.04 0.44 - 1.00 mg/dL   Calcium 8.0 (L) 8.9 - 10.3 mg/dL   GFR, Estimated >54 >09 mL/min    Comment: (NOTE) Calculated using the CKD-EPI Creatinine Equation (2021)    Anion gap 5 5 - 15    Comment: Performed at [facility], [address] [place], [place] [address]  Heparin level (unfractionated)     Status: None   Collection Time:  09/18/21  4:24 AM  Result Value Ref Range   Heparin Unfractionated 0.63 0.30 - 0.70 IU/mL    Comment: (NOTE) The clinical reportable range upper limit is being lowered to >1.10 to align with the FDA approved guidance for the current laboratory assay.  If heparin results are below expected values, and patient dosage has  been confirmed, suggest follow up testing of antithrombin III levels. Performed at [facility], [address] [place], [place] [address]   APTT     Status: Abnormal   Collection Time: 09/18/21  4:24 AM  Result Value Ref Range   aPTT 98 (H) 24 - 36 seconds    Comment:        IF BASELINE aPTT IS ELEVATED, SUGGEST PATIENT RISK ASSESSMENT BE USED TO DETERMINE APPROPRIATE ANTICOAGULANT THERAPY. Performed at [facility], [address] [place], [place] [address]   Glucose, capillary     Status: Abnormal   Collection Time: 09/18/21  4:31 AM  Result Value Ref Range   Glucose-Capillary 119 (H) 70 - 99 mg/dL    Comment: Glucose reference range applies only to samples taken after fasting for at least 8 hours.  Glucose, capillary     Status: Abnormal   Collection Time: 09/18/21  8:02 AM  Result Value Ref Range   Glucose-Capillary 143 (H) 70 - 99 mg/dL    Comment: Glucose reference range applies only to samples taken after fasting for at least 8 hours.   Comment 1 Notify RN   Glucose, capillary     Status: Abnormal   Collection Time: 09/18/21 12:11 PM  Result Value Ref Range   Glucose-Capillary 146 (H) 70 - 99 mg/dL    Comment: Glucose reference range applies only to samples taken after fasting for at least 8 hours.  Glucose, capillary     Status: Abnormal   Collection Time: 09/18/21  4:31 PM  Result Value Ref Range   Glucose-Capillary 146 (H) 70 - 99 mg/dL    Comment: Glucose reference range applies only to samples taken after fasting for at least 8 hours.   Comment 1 Notify RN   Glucose, capillary     Status: Abnormal   Collection Time:  09/18/21  8:31 PM  Result Value Ref Range   Glucose-Capillary 115 (H) 70 - 99 mg/dL    Comment: Glucose reference range applies only to samples taken after fasting for at least 8 hours.  Glucose, capillary     Status: None   Collection Time: 09/18/21 11:58 PM  Result Value Ref Range   Glucose-Capillary 87 70 - 99 mg/dL    Comment: Glucose reference range applies only to samples taken after fasting for at least 8 hours.  Glucose, capillary     Status: Abnormal   Collection Time: 09/19/21  5:08 AM  Result Value Ref Range   Glucose-Capillary 122 (H) 70 - 99 mg/dL    Comment: Glucose reference range applies only to samples taken after fasting for at least 8 hours.  CBC     Status: Abnormal   Collection Time: 09/19/21  5:36 AM  Result Value Ref Range   WBC 18.7 (H) 4.0 - 10.5 K/uL   RBC 3.54 (L) 3.87 - 5.11 MIL/uL   Hemoglobin 11.2 (L) 12.0 - 15.0 g/dL   HCT 40.9 (L) 81.1 - 91.4 %   MCV 94.6 80.0 - 100.0 fL   MCH 31.6 26.0 - 34.0 pg   MCHC 33.4 30.0 - 36.0 g/dL   RDW 78.2 95.6 - 21.3 %   Platelets 244 150 - 400 K/uL   nRBC 0.0 0.0 - 0.2 %    Comment: Performed at [facility], [address], [place], [place] [address]  Basic metabolic panel     Status: Abnormal   Collection Time: 09/19/21  5:36 AM  Result Value Ref Range   Sodium 135 135 - 145 mmol/L   Potassium 4.0 3.5 - 5.1 mmol/L   Chloride 102 98 - 111 mmol/L   CO2 27 22 - 32 mmol/L   Glucose, Bld 113 (H) 70 - 99 mg/dL    Comment: Glucose reference range applies only to samples taken after fasting for at least 8 hours.   BUN 20 8 - 23 mg/dL   Creatinine, Ser 8.46 0.44 - 1.00 mg/dL   Calcium 8.0 (L) 8.9 - 10.3 mg/dL   GFR, Estimated >96 >29 mL/min    Comment: (NOTE) Calculated using the CKD-EPI Creatinine Equation (2021)    Anion gap 6 5 - 15    Comment: Performed at [facility], [address] [place], [place] [address]  Heparin level (unfractionated)     Status: None   Collection Time: 09/19/21   5:36 AM  Result Value Ref Range   Heparin Unfractionated 0.42 0.30 - 0.70 IU/mL    Comment: (NOTE) The clinical reportable range upper limit is being lowered to >1.10 to align with the FDA approved guidance for the current laboratory assay.  If heparin results are below expected values, and patient dosage has  been confirmed, suggest follow up testing of antithrombin III levels. Performed at [facility], [address] [place], [place] [address]   Glucose, capillary     Status: Abnormal   Collection Time: 09/19/21  7:44 AM  Result Value Ref Range   Glucose-Capillary 112 (H) 70 - 99 mg/dL    Comment: Glucose reference range applies only to samples taken after fasting for at least 8 hours.  Glucose, capillary     Status: Abnormal   Collection Time: 09/19/21 11:45 AM  Result Value Ref Range   Glucose-Capillary 119 (H) 70 - 99 mg/dL    Comment: Glucose reference range applies only to samples taken after fasting for at least 8 hours.  Cryptococcal antigen, CSF ([facility] only)     Status: None   Collection Time: 09/19/21 12:29 PM  Result Value Ref Range   Crypto Ag NEGATIVE NEGATIVE  Cryptococcal Ag Titer NOT INDICATED NOT INDICATED    Comment: Performed at [facility], [address] [place], [place] [address]  Glucose, CSF     Status: None   Collection Time: 09/19/21 12:29 PM  Result Value Ref Range   Glucose, CSF 64 40 - 70 mg/dL    Comment: Performed at [facility], [address] [place], [place] [address]  Protein, CSF     Status: None   Collection Time: 09/19/21 12:29 PM  Result Value Ref Range   Total  Protein, CSF 45 15 - 45 mg/dL    Comment: Performed at [facility], [address] [place], [place] [address]  CSF cell count with differential     Status: Abnormal   Collection Time: 09/19/21 12:29 PM  Result Value Ref Range   Tube # 3    Color, CSF COLORLESS COLORLESS   Appearance, CSF CLEAR CLEAR   Supernatant NOT INDICATED     RBC Count, CSF 2 0 - 3 /cu mm   WBC, CSF 7 (H) 0 - 5 /cu mm   Segmented Neutrophils-CSF 0 %   Lymphs, CSF 44 %   Monocyte-Macrophage-Spinal Fluid 56 %   Eosinophils, CSF 0 %    Comment: Performed at [facility], [address] [place], [place] [address]  CSF culture w Gram Stain     Status: None (Preliminary result)   Collection Time: 09/19/21 12:29 PM   Specimen: PATH Cytology CSF; Cerebrospinal Fluid  Result Value Ref Range   Specimen Description CSF    Special Requests NONE    Gram Stain      WBC SEEN RED BLOOD CELLS NO ORGANISMS SEEN Performed at [facility], [address], [place], [place] [address]    Culture PENDING    Report Status PENDING   Glucose, capillary     Status: Abnormal   Collection Time: 09/19/21  4:23 PM  Result Value Ref Range   Glucose-Capillary 124 (H) 70 - 99 mg/dL    Comment: Glucose reference range applies only to samples taken after fasting for at least 8 hours.    Recent Results (from the past 240 hour(s))  Urine Culture     Status: Abnormal   Collection Time: 09/15/21  5:40 PM   Specimen: Urine, Clean Catch  Result Value Ref Range Status   Specimen Description   Final    URINE, CLEAN CATCH Performed at [facility], [address], [place], [place] [address]    Special Requests   Final    NONE Performed at [facility], [address], [place], [place] [address]    Culture (A)  Final    <10,000 COLONIES/mL INSIGNIFICANT GROWTH Performed at [facility], [address] [place], [place] [address]    Report Status 09/17/2021 FINAL  Final  CSF culture w Gram Stain     Status: None (Preliminary result)   Collection Time: 09/19/21 12:29 PM   Specimen: PATH Cytology CSF; Cerebrospinal Fluid  Result Value Ref Range Status   Specimen Description CSF  Final   Special Requests NONE  Final   Gram Stain   Final    WBC SEEN RED BLOOD CELLS NO ORGANISMS SEEN Performed at [facility],  [address] [place], [place] [address]    Culture PENDING  Incomplete   Report Status PENDING  Incomplete    Lipid Panel No results for input(s): CHOL, TRIG, HDL, CHOLHDL, VLDL, LDLCALC in  the last 72 hours.  Studies/Results: IR FL GUIDED LOC OF NEEDL/CATH TIP FOR SPINAL INJECT LT  Result Date: 09/19/2021 CLINICAL DATA:  Acute metabolic encephalopathy EXAM: Lumbar puncture using fluoroscopic guidance ANESTHESIA/SEDATION: Local analgesia MEDICATIONS: None CONTRAST:  None FLUOROSCOPY TIME: FLUOROSCOPY TIME 0.8 minutes (6 mGy) PROCEDURE: The patient was placed in the lateral decubitus position on the exam table. Skin entry site was marked using fluoroscopic guidance. The overlying skin was prepped and draped in the standard sterile fashion. Local analgesia was obtained with 1% lidocaine. Using fluoroscopic guidance, 20 gauge spinal needle was advanced into the subarachnoid space at L2-L3 using a midline approach under fluoroscopic guidance. There was spontaneous return of clear, colorless CSF. Opening pressure was estimated at 8 cm H2O. Approximately 8 mL of CSF was collected and sent to the lab for analysis. Needle was removed and hemostasis was achieved the access site using manual pressure. Clean dressing was placed. The patient tolerated the procedure well without immediate complication. COMPLICATIONS: None immediate FINDINGS: See above. IMPRESSION: Successful lumbar puncture at L2-L3 using fluoroscopic guidance. Opening pressure was estimated at 8 cm H2O. A sample of CSF was sent to the lab for analysis. Electronically Signed   By: Olive Bass M.D.   On: 09/19/2021 13:39    Medications: Scheduled:  apixaban  5 mg Oral BID   atorvastatin  40 mg Per Tube Daily   feeding supplement (PROSource TF)  45 mL Per Tube BID   free water  200 mL Per Tube Q4H   lisinopril  40 mg Per Tube Daily   mouth rinse  15 mL Mouth Rinse BID   methimazole  5 mg Per Tube Daily   pantoprazole (PROTONIX) IV  40  mg Intravenous Q24H   spironolactone  50 mg Per Tube Daily   Continuous:  sodium chloride Stopped (09/19/21 1424)   acyclovir 500 mg (09/19/21 1539)   cefTRIAXone (ROCEPHIN)  IV 2 g (09/19/21 1537)   dextrose Stopped (09/19/21 1427)   feeding supplement (OSMOLITE 1.5 CAL) Stopped (09/18/21 2300)   Assessment: 71 year old female with AMS. 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  1. Exam today: Continues to improve gradually (see exam above for detailed information).  2. Brain imaging: - MRI brain without contrast: Small acute infarct in the left thalamocapsular region. Additional punctate more subacute appearing right thalamic infarct. Mild to moderate chronic microvascular ischemic changes. 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 - MRI brain with contrast: No pathologic enhancement or MR evidence for acute intracranial infection. 3. TTE shows LVEF of 60-65% with normal LV function. No mural thrombus or valvular vegetation mentioned in the report.  4. 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 EEG: This study is suggestive of moderate to severe diffuse encephalopathy, nonspecific etiology but likely related to toxic-metabolic causes. No seizures or definite epileptiform discharges were seen throughout the recording. 6. DDx:  - Encephalopathy cannot be explained by her stroke and it is too severe to be solely due to UTI given lack of improvement with ABX. An unusual etiology such as an encephalitis secondary to viral infection involving 2 or more organ systems (e.g. lung, GI tract and brain) is felt to be a significant differential diagnostic consideration.  - Subclinical seizures unlikely given no epileptiform abnormalities on EEG.  - Bickerstaff's brainstem encephalitis and cerebral vasculitis can both be associated with ulcerative colitis  in rare  instances affecting level of consciousness together with GI complaints and even cold symptoms.  - Cytomegalovirus colitis can be accompanied by encphalopathy.  - Seasonally, given the cold weather, would not expect an insect-borne viral infection during this time of the year.  - Lymphocytic choriomeningits virus secondary to transmission from mice could present with her sequence of symptoms.  - Catatonia is also on the DDx 7. Underwent LP today; WBC count is 7. ID recommends discontinuation of ampicillin, continue ceftriaxone but likely will d/c tomorrow. Vancomycin was already d/c'd. Continue acyclovir until HSV results negative. 8. HIV screen 4th generation wRfx: Non-reactive.    Recommendations: 1. Abx per ID - continue ceftriaxone for now. Continue acyclovir until HSV CSF PCR results negative. 2. LF/u outstanding CSF studies 3. OK to restart eliquis 6-8 hrs after atraumatic tap 4. Labs pending:  - Anti-GQ1b antibodies (ordered as [facility] send out on 3/9) - Serum CMV PCR (ordered on 3/9) - Lyme titer from CSF ordered (pending sample) - Lymphocytic choriomeningitis virus IgG and IgM (ordered as [facility] send out on 3/9) 5. If LP is negative, can consider an Ativan challenge to assess for possible catatonia, which should improve transiently after benzodiazepine administration, but often requires repeated doses.   Bing Neighborsolleen Michaeal Davis, MD [facility] [phone]  If 7pm- 7am, please page neurology on call as listed in AMION.

## 2021-09-19 NOTE — Progress Notes (Signed)
FSBS 87.  Tube feedings discontinued as ordered earlier tonight.  Docia Furl NP made aware. IVF ordered and initiated. ?

## 2021-09-20 DIAGNOSIS — T17908D Unspecified foreign body in respiratory tract, part unspecified causing other injury, subsequent encounter: Secondary | ICD-10-CM

## 2021-09-20 DIAGNOSIS — G9341 Metabolic encephalopathy: Secondary | ICD-10-CM | POA: Diagnosis not present

## 2021-09-20 DIAGNOSIS — R1310 Dysphagia, unspecified: Secondary | ICD-10-CM | POA: Diagnosis not present

## 2021-09-20 DIAGNOSIS — G0490 Encephalitis and encephalomyelitis, unspecified: Secondary | ICD-10-CM | POA: Diagnosis not present

## 2021-09-20 DIAGNOSIS — I4891 Unspecified atrial fibrillation: Secondary | ICD-10-CM | POA: Diagnosis not present

## 2021-09-20 DIAGNOSIS — K529 Noninfective gastroenteritis and colitis, unspecified: Secondary | ICD-10-CM | POA: Diagnosis not present

## 2021-09-20 DIAGNOSIS — I639 Cerebral infarction, unspecified: Secondary | ICD-10-CM | POA: Diagnosis not present

## 2021-09-20 DIAGNOSIS — N342 Other urethritis: Secondary | ICD-10-CM | POA: Diagnosis not present

## 2021-09-20 LAB — BASIC METABOLIC PANEL
Anion gap: 6 (ref 5–15)
BUN: 15 mg/dL (ref 8–23)
CO2: 26 mmol/L (ref 22–32)
Calcium: 8.2 mg/dL — ABNORMAL LOW (ref 8.9–10.3)
Chloride: 102 mmol/L (ref 98–111)
Creatinine, Ser: 0.74 mg/dL (ref 0.44–1.00)
GFR, Estimated: >60 mL/min (ref >60)
Glucose, Bld: 134 mg/dL — ABNORMAL HIGH (ref 70–99)
Potassium: 3.8 mmol/L (ref 3.5–5.1)
Sodium: 134 mmol/L — ABNORMAL LOW (ref 135–145)

## 2021-09-20 LAB — CBC
HCT: 34.2 % — ABNORMAL LOW (ref 36.0–46.0)
Hemoglobin: 11.6 g/dL — ABNORMAL LOW (ref 12.0–15.0)
MCH: 32 pg (ref 26.0–34.0)
MCHC: 33.9 g/dL (ref 30.0–36.0)
MCV: 94.2 fL (ref 80.0–100.0)
Platelets: 269 10*3/uL (ref 150–400)
RBC: 3.63 MIL/uL — ABNORMAL LOW (ref 3.87–5.11)
RDW: 12.5 % (ref 11.5–15.5)
WBC: 11 10*3/uL — ABNORMAL HIGH (ref 4.0–10.5)
nRBC: 0 % (ref 0.0–0.2)

## 2021-09-20 LAB — HIV-1 RNA, PCR (GRAPH) RFX/GENO EDI
HIV-1 RNA BY PCR: <20 copies/mL
HIV-1 RNA Quant, Log: UNDETERMINED log10copy/mL

## 2021-09-20 LAB — GLUCOSE, CAPILLARY
Glucose-Capillary: 106 mg/dL — ABNORMAL HIGH (ref 70–99)
Glucose-Capillary: 107 mg/dL — ABNORMAL HIGH (ref 70–99)
Glucose-Capillary: 118 mg/dL — ABNORMAL HIGH (ref 70–99)
Glucose-Capillary: 122 mg/dL — ABNORMAL HIGH (ref 70–99)
Glucose-Capillary: 130 mg/dL — ABNORMAL HIGH (ref 70–99)

## 2021-09-20 MED ORDER — ENSURE ENLIVE PO LIQD
237.0000 mL | Freq: Three times a day (TID) | ORAL | Status: DC
  Administered 2021-09-20 – 2021-09-24 (×11): 237 mL via ORAL

## 2021-09-20 NOTE — Progress Notes (Signed)
? ? ? ? ? ? ?Subjective: ? ?No new complaints ? ? ?Antibiotics:  ?Anti-infectives (From admission, onward)  ? ? Start     Dose/Rate Route Frequency Ordered Stop  ? 09/17/21 2200  acyclovir (ZOVIRAX) 500 mg in dextrose 5 % 100 mL IVPB  Status:  Discontinued       ? 500 mg ?110 mL/hr over 60 Minutes Intravenous Every 8 hours 09/17/21 1038 09/17/21 1050  ? 09/17/21 1400  acyclovir (ZOVIRAX) 500 mg in dextrose 5 % 100 mL IVPB       ? 500 mg ?110 mL/hr over 60 Minutes Intravenous Every 8 hours 09/17/21 1050    ? 09/17/21 1200  ampicillin (OMNIPEN) 2 g in sodium chloride 0.9 % 100 mL IVPB  Status:  Discontinued       ? 2 g ?300 mL/hr over 20 Minutes Intravenous Every 4 hours 09/17/21 1036 09/19/21 1437  ? 09/16/21 2300  vancomycin (VANCOCIN) IVPB 1000 mg/200 mL premix  Status:  Discontinued       ?See Hyperspace for full Linked Orders Report.  ? 1,000 mg ?200 mL/hr over 60 Minutes Intravenous Every 24 hours 09/15/21 2157 09/17/21 1027  ? 09/15/21 2300  acyclovir (ZOVIRAX) 485 mg in dextrose 5 % 100 mL IVPB  Status:  Discontinued       ? 10 mg/kg ? 48.4 kg ?109.7 mL/hr over 60 Minutes Intravenous Every 12 hours 09/15/21 2146 09/17/21 1038  ? 09/15/21 2300  cefTRIAXone (ROCEPHIN) 2 g in sodium chloride 0.9 % 100 mL IVPB       ? 2 g ?200 mL/hr over 30 Minutes Intravenous Every 12 hours 09/15/21 2146 09/20/21 2359  ? 09/15/21 2300  ampicillin (OMNIPEN) 2 g in sodium chloride 0.9 % 100 mL IVPB  Status:  Discontinued       ? 2 g ?300 mL/hr over 20 Minutes Intravenous Every 6 hours 09/15/21 2206 09/17/21 1036  ? 09/15/21 2245  vancomycin (VANCOREADY) IVPB 1250 mg/250 mL       ?See Hyperspace for full Linked Orders Report.  ? 1,250 mg ?166.7 mL/hr over 90 Minutes Intravenous  Once 09/15/21 2157 09/16/21 1911  ? 09/08/21 1200  cefTRIAXone (ROCEPHIN) 2 g in sodium chloride 0.9 % 100 mL IVPB       ? 2 g ?200 mL/hr over 30 Minutes Intravenous Every 24 hours 09/07/21 1506 09/13/21 1447  ? 09/07/21 2200  metroNIDAZOLE (FLAGYL) IVPB  500 mg  Status:  Discontinued       ? 500 mg ?100 mL/hr over 60 Minutes Intravenous Every 12 hours 09/07/21 1506 09/08/21 1407  ? 09/07/21 1445  metroNIDAZOLE (FLAGYL) IVPB 500 mg       ? 500 mg ?100 mL/hr over 60 Minutes Intravenous  Once 09/07/21 1435 09/07/21 1545  ? 09/07/21 1230  cefTRIAXone (ROCEPHIN) 1 g in sodium chloride 0.9 % 100 mL IVPB       ? 1 g ?200 mL/hr over 30 Minutes Intravenous  Once 09/07/21 1217 09/07/21 1320  ? 09/07/21 1230  azithromycin (ZITHROMAX) 500 mg in sodium chloride 0.9 % 250 mL IVPB       ? 500 mg ?250 mL/hr over 60 Minutes Intravenous  Once 09/07/21 1217 09/07/21 1424  ? ?  ? ? ?Medications: ?Scheduled Meds: ? apixaban  5 mg Oral BID  ? atorvastatin  40 mg Per Tube Daily  ? feeding supplement  237 mL Oral TID BM  ? lisinopril  40 mg Per Tube Daily  ? mouth rinse  15 mL Mouth Rinse BID  ? methimazole  5 mg Per Tube Daily  ? pantoprazole (PROTONIX) IV  40 mg Intravenous Q24H  ? spironolactone  50 mg Per Tube Daily  ? ?Continuous Infusions: ? sodium chloride Stopped (09/19/21 1424)  ? acyclovir Stopped (09/20/21 0601)  ? cefTRIAXone (ROCEPHIN)  IV Stopped (09/20/21 0304)  ? dextrose 50 mL/hr at 09/20/21 1000  ? ?PRN Meds:.sodium chloride, albuterol, ondansetron **OR** ondansetron (ZOFRAN) IV, oxyCODONE-acetaminophen ? ? ? ?Objective: ?Weight change: 0.615 kg ? ?Intake/Output Summary (Last 24 hours) at 09/20/2021 1511 ?Last data filed at 09/20/2021 1410 ?Gross per 24 hour  ?Intake 1162.51 ml  ?Output 900 ml  ?Net 262.51 ml  ? ? ?Blood pressure 138/71, pulse 67, temperature 98.4 ?F (36.9 ?C), resp. rate 19, height 5\' 10"  (1.778 m), weight 51.1 kg, SpO2 97 %. ?Temp:  [97.5 ?F (36.4 ?C)-98.4 ?F (36.9 ?C)] 98.4 ?F (36.9 ?C) (03/14 1139) ?Pulse Rate:  [53-67] 67 (03/14 1139) ?Resp:  [17-23] 19 (03/14 1139) ?BP: (114-145)/(42-71) 138/71 (03/14 1139) ?SpO2:  [93 %-100 %] 97 % (03/14 1139) ?Weight:  [51.1 kg] 51.1 kg (03/14 0500) ? ?Physical Exam: ?Physical Exam ?Vitals reviewed.  ?HENT:  ?    Head: Normocephalic and atraumatic.  ?Cardiovascular:  ?   Rate and Rhythm: Bradycardia present. Rhythm irregular.  ?Pulmonary:  ?   Effort: Pulmonary effort is normal. No respiratory distress.  ?   Breath sounds: No wheezing.  ?Abdominal:  ?   General: Bowel sounds are normal. There is no distension.  ?   Palpations: There is no mass.  ?   Tenderness: There is no abdominal tenderness.  ?   Hernia: No hernia is present.  ?Musculoskeletal:     ?   General: Normal range of motion.  ?Skin: ?   Findings: No rash.  ?Neurological:  ?   General: No focal deficit present.  ?   Mental Status: She is alert.  ?Psychiatric:     ?   Mood and Affect: Mood normal. Mood is not depressed.     ?   Speech: Speech is delayed.     ?   Behavior: Behavior is cooperative.     ?   Cognition and Memory: Cognition is not impaired. Memory is impaired. She exhibits impaired recent memory. She does not exhibit impaired remote memory.  ?  ?[place] ? ?She cannot remember anything about why she was admitted to the hospital. ? ? ?CBC: ? ? ? ?BMET ?Recent Labs  ?  09/19/21 ?[id number] 09/20/21 ?0606  ?NA 135 134*  ?K 4.0 3.8  ?CL 102 102  ?CO2 27 26  ?GLUCOSE 113* 134*  ?BUN 20 15  ?CREATININE 0.76 0.74  ?CALCIUM 8.0* 8.2*  ? ? ? ? ?Liver Panel ? ?No results for input(s): PROT, ALBUMIN, AST, ALT, ALKPHOS, BILITOT, BILIDIR, IBILI in the last 72 hours. ? ? ? ? ?Sedimentation Rate ?No results for input(s): ESRSEDRATE in the last 72 hours. ?C-Reactive Protein ?No results for input(s): CRP in the last 72 hours. ? ?Micro Results: ?Recent Results (from the past 720 hour(s))  ?Blood culture (single)     Status: None  ? Collection Time: 09/07/21 11:09 AM  ? Specimen: BLOOD LEFT ARM  ?Result Value Ref Range Status  ? Specimen Description BLOOD LEFT ARM  Final  ? Special Requests   Final  ?  BOTTLES DRAWN AEROBIC AND ANAEROBIC Blood  Culture adequate volume  ? Culture   Final  ?  NO  GROWTH 5 DAYS ?Performed at [facility], [address], [place], [place] [address] ?  ? Report Status 09/12/2021 FINAL  Final  ?Resp Panel by RT-PCR (Flu A&B, Covid) Nasopharyngeal Swab     Status: None  ? Collection Time: 09/07/21 11:09 AM  ? Specimen: Nasopharyngeal Swab; Nasopharyngeal(NP) swabs in vial transport medium  ?Result Value Ref Range Status  ? SARS Coronavirus 2 by RT PCR NEGATIVE NEGATIVE Final  ?  Comment: (NOTE) ?SARS-CoV-2 target nucleic acids are NOT DETECTED. ? ?The SARS-CoV-2 RNA is generally detectable in upper respiratory ?specimens during the acute phase of infection. The lowest ?concentration of SARS-CoV-2 viral copies this assay can detect is ?138 copies/mL. A negative result does not preclude SARS-Cov-2 ?infection and should not be used as the sole basis for treatment or ?other patient management decisions. A negative result may occur with  ?improper specimen collection/handling, submission of specimen other ?than nasopharyngeal swab, presence of viral mutation(s) within the ?areas targeted by this assay, and inadequate number of viral ?copies(<138 copies/mL). A negative result must be combined with ?clinical observations, patient history, and epidemiological ?information. The expected result is Negative. ? ?Fact Sheet for Patients:  ?BloggerCourse.com ? ?Fact Sheet for Healthcare Providers:  ?SeriousBroker.it ? ?This test is no t yet approved or cleared by the [place] FDA and  ?has been authorized for detection and/or diagnosis of SARS-CoV-2 by ?FDA under an Emergency Use Authorization (EUA). This EUA will remain  ?in effect (meaning this test can be used) for the duration of the ?COVID-19 declaration under Section 564(b)(1) of the Act, 21 ?U.S.C.section 360bbb-3(b)(1), unless the authorization is terminated  ?or revoked sooner.  ? ? ?  ? Influenza A by PCR NEGATIVE NEGATIVE Final  ? Influenza B by PCR NEGATIVE NEGATIVE  Final  ?  Comment: (NOTE) ?The Xpert Xpress SARS-CoV-2/FLU/RSV plus assay is intended as an aid ?in the diagnosis of influenza from Nasopharyngeal swab specimens and ?should not be used as a sole basis for t

## 2021-09-20 NOTE — Progress Notes (Signed)
Inpatient Rehabilitation Admissions Coordinator  ? ?Rehab Consult received. I contacted patient's local daughter, Aline August by phone. We discussed goals and expectations of a possible Cir admit. I arranged for meeting at bedside for assessment Wednesday between 2 and 3 pm. She is in agreement. Final dispo pending further assessment. ? ?Ottie Glazier, RN, MSN ?Rehab Admissions Coordinator ?([phone] ?09/20/2021 4:12 PM  ?

## 2021-09-20 NOTE — Progress Notes (Signed)
Occupational Therapy Treatment ?Patient Details ?Name: Joy Clark ?MRN: [id number] ?DOB: Mar 12, 1951 ?Today's Date: 09/20/2021 ? ? ?History of present illness Joy Clark is a 71 y.o. female with medical history significant for nicotine dependence, chronic pain syndrome, dyslipidemia who was brought into the ER by EMS for evaluation of mental status changes for about 4 days.  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  S/p lumbar puncture 09/19/21. ?  ?OT comments ? Joy Clark was seen for OT/PT co-treatment on this date, re-evaluation completed 2/2 prolonged hospital stay and goals updated. Upon arrival to room pt reclined in bed with soiled bed sheets. Pt requires MAX A toileting bed level, L+R rolling. MIN A improving to CGA static sitting balance, MAX A don B socks in sitting. MIN A x2 + RW sit<>stand and bed>chair t/f, MOD cues t/o for sequencing. Pt making good progress toward goals, updated to reflect progress. Pt continues to benefit from skilled OT services to maximize return to PLOF and minimize risk of future falls, injury, caregiver burden, and readmission. Will continue to follow POC. Discharge recommendation updated to CIR.  ?  ? ?Recommendations for follow up therapy are one component of a multi-disciplinary discharge planning process, led by the attending physician.  Recommendations may be updated based on patient status, additional functional criteria and insurance authorization. ?   ?Follow Up Recommendations ? Acute inpatient rehab (3hours/day)  ?  ?Assistance Recommended at Discharge Frequent or constant Supervision/Assistance  ?Patient can return home with the following ? Two people to help with bathing/dressing/bathroom;Help with stairs or ramp for entrance;Direct supervision/assist for medications management;A lot of help with walking and/or transfers ?  ?Equipment Recommendations ? Other  (comment) (defer to next venue of care)  ?  ?Recommendations for Other Services   ? ?  ?Precautions / Restrictions Precautions ?Precautions: Fall ?Precaution Comments: Aspiration ?Restrictions ?Weight Bearing Restrictions: No  ? ? ?  ? ?Mobility Bed Mobility ?Overal bed mobility: Needs Assistance ?Bed Mobility: Rolling, Supine to Sit ?Rolling: Min assist ?  ?Supine to sit: Mod assist ?  ?  ?  ?  ? ?Transfers ?Overall transfer level: Needs assistance ?Equipment used: Rolling walker (2 wheels) ?Transfers: Sit to/from Stand ?Sit to Stand: Min assist, +2 physical assistance ?  ?  ?  ?  ?  ?  ?  ?  ?Balance Overall balance assessment: Needs assistance ?Sitting-balance support: Bilateral upper extremity supported, Feet supported ?Sitting balance-[name]: Fair ?  ?  ?Standing balance support: During functional activity, Bilateral upper extremity supported ?Standing balance-[name]: Poor ?  ?  ?  ?  ?  ?  ?  ?  ?  ?  ?  ?  ?   ? ?ADL either performed or assessed with clinical judgement  ? ?ADL Overall ADL's : Needs assistance/impaired ?  ?  ?  ?  ?  ?  ?  ?  ?  ?  ?  ?  ?  ?  ?  ?  ?  ?  ?  ?General ADL Comments: MAX A toileting bed level, L+R rolling. MIN A improving to CGA static sitting balance, MAX A don B socks in sitting. MIN A x2 + RW for ADL t/f, MOD cues t/o for sequencing ?  ? ?Extremity/Trunk Assessment Upper Extremity Assessment ?Upper Extremity Assessment: Generalized weakness ?  ?Lower Extremity Assessment ?Lower Extremity Assessment: Generalized weakness ?  ?  ?  ? ? ?  Cognition Arousal/Alertness: Awake/alert ?Behavior During Therapy: Flat affect ?Overall Cognitive Status: Impaired/Different from baseline ?Area of Impairment: Orientation, Attention, Memory, Following commands, Safety/judgement, Problem solving ?  ?  ?  ?  ?  ?  ?  ?  ?Orientation Level: Disoriented to, Time, Place (states year 2023 however when asked month continues to state 2023, given options states january. Reports in hospital in  [place]) ?Current Attention Level: Sustained ?Memory: Decreased recall of precautions, Decreased short-term memory ?Following Commands: Follows one step commands inconsistently ?Safety/Judgement: Decreased awareness of safety, Decreased awareness of deficits ?  ?Problem Solving: Slow processing, Decreased initiation ?General Comments: improved communication, continues to require verbal.tactile cues ?  ?  ?   ?Pertinent Vitals/ Pain       Pain Assessment ?Pain Assessment: Faces ?Faces Pain Scale: Hurts little more ?Pain Location: bottom with pericare ?Pain Descriptors / Indicators: Tender, Sore, Discomfort ?Pain Intervention(s): Limited activity within patient's tolerance, Repositioned ? ? ?Frequency ? Min 3X/week  ? ? ? ? ?  ?Progress Toward Goals ? ?OT Goals(current goals can now be found in the care plan section) ? Progress towards OT goals: Progressing toward goals ? ?Acute Rehab OT Goals ?Patient Stated Goal: to get better ?OT Goal Formulation: With patient ?Time For Goal Achievement: 10/04/21 ?Potential to Achieve Goals: Fair ?ADL Goals ?Pt Will Perform Grooming: with set-up;with supervision;sitting ?Pt Will Perform Lower Body Dressing: with min assist;sitting/lateral leans ?Pt Will Transfer to Toilet: with min assist;ambulating;regular height toilet ?Pt Will Perform Toileting - Clothing Manipulation and hygiene: with supervision;sitting/lateral leans  ?Plan Frequency remains appropriate;Discharge plan needs to be updated   ? ?Co-evaluation ? ? ? PT/OT/SLP Co-Evaluation/Treatment: Yes ?Reason for Co-Treatment: For patient/therapist safety;To address functional/ADL transfers;Necessary to address cognition/behavior during functional activity ?PT goals addressed during session: Mobility/safety with mobility;Balance ?OT goals addressed during session: ADL's and self-care ?  ? ?  ?AM-PAC OT "6 Clicks" Daily Activity     ?Outcome Measure ? ? Help from another person eating meals?: A Little ?Help from another person  taking care of personal grooming?: A Little ?Help from another person toileting, which includes using toliet, bedpan, or urinal?: A Lot ?Help from another person bathing (including washing, rinsing, drying)?: A Lot ?Help from another person to put on and taking off regular upper body clothing?: A Little ?Help from another person to put on and taking off regular lower body clothing?: A Lot ?6 Click Score: 15 ? ?  ?End of Session Equipment Utilized During Treatment: Rolling walker (2 wheels) ? ?OT Visit Diagnosis: Other abnormalities of gait and mobility (R26.89);Muscle weakness (generalized) (M62.81) ?  ?Activity Tolerance Patient tolerated treatment well ?  ?Patient Left in chair;with call bell/phone within reach;with chair alarm set;with nursing/sitter in room ?  ?Nurse Communication Mobility status ?  ? ?   ? ?Time: 9983-3825 ?OT Time Calculation (min): 46 min ? ?Charges: OT General Charges ?$OT Visit: 1 Visit ?OT Evaluation ?$OT Re-eval: 1 Re-eval ?OT Treatments ?$Self Care/Home Management : 23-37 mins ? ?Kathie Dike, M.S. OTR/L  ?09/20/21, 1:13 PM  ?ascom [phone] ? ?

## 2021-09-20 NOTE — Progress Notes (Signed)
Inpatient Rehab Admissions Coordinator:  ? ?Per therapy recommendations,  patient was screened for CIR candidacy by Vertis Bauder, MS, CCC-SLP. At this time, Pt. Appears to be a a potential candidate for CIR. I will place   order for rehab consult per protocol for full assessment. Please contact me any with questions. ? ?Anderson Coppock, MS, CCC-SLP ?Rehab Admissions Coordinator  ?[phone] (celll) ?[phone] (office) ? ?

## 2021-09-20 NOTE — Progress Notes (Signed)
Physical Therapy Treatment ?Patient Details ?Name: Joy Clark ?MRN: [id number] ?DOB: 01-Dec-1950 ?Today's Date: 09/20/2021 ? ? ?History of Present Illness Joy Clark is a 71 y.o. female with medical history significant for nicotine dependence, chronic pain syndrome, dyslipidemia who was brought into the ER by EMS for evaluation of mental status changes for about 4 days.  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  S/p lumbar puncture 09/19/21. ? ?  ?PT Comments  ? ? PT/OT co-treatment performed.  Pt noted to be incontinent of stool requiring clean-up in bed.  Min assist with logrolling in bed; mod to max assist semi-supine to sitting edge of bed; mod assist progressing to close SBA for sitting balance edge of bed; min assist x2 to stand and take steps with RW bed to recliner (posterior lean noted requiring assist for balance).  Pt oriented to person, hospital (although pt reporting being in [place]), and year (although pt reporting being January).  Increased time required to respond and initiate movement for 1 step cues.  Will continue to focus on strengthening, balance, and progressive functional mobility during hospitalization.  PT discharge recommendations updated to acute inpatient rehab (MD and [facility] notified).  ?  ?Recommendations for follow up therapy are one component of a multi-disciplinary discharge planning process, led by the attending physician.  Recommendations may be updated based on patient status, additional functional criteria and insurance authorization. ? ?Follow Up Recommendations ? Acute inpatient rehab (3hours/day) ?  ?  ?Assistance Recommended at Discharge Frequent or constant Supervision/Assistance  ?Patient can return home with the following A lot of help with walking and/or transfers;A lot of help with bathing/dressing/bathroom;Assistance with cooking/housework;Assistance with  feeding;Direct supervision/assist for medications management;Direct supervision/assist for financial management;Assist for transportation;Help with stairs or ramp for entrance ?  ?Equipment Recommendations ? BSC/3in1;Wheelchair (measurements PT);Wheelchair cushion (measurements PT);Hospital bed  ?  ?Recommendations for Other Services Rehab consult ? ? ?  ?Precautions / Restrictions Precautions ?Precautions: Fall ?Precaution Comments: Aspiration; feeding tube ?Restrictions ?Weight Bearing Restrictions: No  ?  ? ?Mobility ? Bed Mobility ?Overal bed mobility: Needs Assistance ?Bed Mobility: Rolling, Supine to Sit ?Rolling: Min assist (logrolling L and R in bed for clean-up d/t bowel incontinence) ?  ?Supine to sit: Mod assist, Max assist (assist for B LE's and trunk) ?  ?  ?General bed mobility comments: vc's and tactile cues for technique; increased time to initiate ?  ? ?Transfers ?Overall transfer level: Needs assistance ?Equipment used: Rolling walker (2 wheels) ?Transfers: Sit to/from Stand ?Sit to Stand: Min assist, +2 physical assistance ?  ?  ?  ?  ?  ?General transfer comment: vc's and tactile cues for UE placement; assist to come to standing and sit down in recliner ?  ? ?Ambulation/Gait ?Ambulation/Gait assistance: Min assist, +2 physical assistance ?Gait Distance (Feet): 3 Feet (bed to recliner) ?Assistive device: Rolling walker (2 wheels) ?  ?Gait velocity: decreased ?  ?  ?General Gait Details: decreased B LE step length/foot clearance; vc's for walker use; assist for safety and to steady ? ? ?Stairs ?  ?  ?  ?  ?  ? ? ?Wheelchair Mobility ?  ? ?Modified Rankin (Stroke Patients Only) ?  ? ? ?  ?Balance Overall balance assessment: Needs assistance ?Sitting-balance support: Bilateral upper extremity supported, Feet supported ?Sitting balance-[name]: Poor ?Sitting balance - Comments: initially mod assist progressing to close SBA for static sitting balance ?Postural control: Posterior  lean ?Standing  balance support: During functional activity, Bilateral upper extremity supported ?Standing balance-[name]: Poor ?Standing balance comment: posterior lean noted with standing activities (with RW use) ?  ?  ?  ?  ?  ?  ?  ?  ?  ?  ?  ?  ? ?  ?Cognition Arousal/Alertness: Awake/alert ?Behavior During Therapy: Flat affect ?Overall Cognitive Status: Impaired/Different from baseline ?Area of Impairment: Orientation, Following commands, Safety/judgement, Attention, Problem solving ?  ?  ?  ?  ?  ?  ?  ?  ?Orientation Level:  (pt knew she was in hospital (in [place]), was 2023 (although reported it was January); able to state name and DOB) ?  ?  ?Following Commands: Follows one step commands with increased time ?Safety/Judgement: Decreased awareness of safety, Decreased awareness of deficits ?  ?Problem Solving: Slow processing, Decreased initiation ?  ?  ?  ? ?  ?Exercises   ? ?  ?General Comments  Nursing cleared pt for participation in physical therapy.  Pt agreeable to PT session. ? ?  ?  ? ?Pertinent Vitals/Pain Pain Assessment ?Pain Assessment: Faces ?Faces Pain Scale: Hurts little more ?Pain Location: pt's bottom ?Pain Descriptors / Indicators: Tender, Sore, Discomfort ?Pain Intervention(s): Limited activity within patient's tolerance, Monitored during session, Repositioned ?Vitals (HR and O2 on room air) stable and WFL throughout treatment session.  ? ? ?Home Living   ?  ?  ?  ?  ?  ?  ?  ?  ?  ?   ?  ?Prior Function    ?  ?  ?   ? ?PT Goals (current goals can now be found in the care plan section) Acute Rehab PT Goals ?Patient Stated Goal: "go home" ?PT Goal Formulation: With patient ?Time For Goal Achievement: 09/24/21 ?Potential to Achieve Goals: Fair ?Progress towards PT goals: Progressing toward goals ? ?  ?Frequency ? ? ? Min 2X/week ? ? ? ?  ?PT Plan Current plan remains appropriate  ? ? ?Co-evaluation PT/OT/SLP Co-Evaluation/Treatment: Yes ?Reason for Co-Treatment: For patient/therapist safety;To  address functional/ADL transfers ?PT goals addressed during session: Mobility/safety with mobility;Balance ?OT goals addressed during session: ADL's and self-care ?  ? ?  ?AM-PAC PT "6 Clicks" Mobility   ?Outcome Measure ? Help needed turning from your back to your side while in a flat bed without using bedrails?: A Little ?Help needed moving from lying on your back to sitting on the side of a flat bed without using bedrails?: A Lot ?Help needed moving to and from a bed to a chair (including a wheelchair)?: Total ?Help needed standing up from a chair using your arms (e.g., wheelchair or bedside chair)?: Total ?Help needed to walk in hospital room?: Total ?Help needed climbing 3-5 steps with a railing? : Total ?6 Click Score: 9 ? ?  ?End of Session Equipment Utilized During Treatment: Gait belt ?Activity Tolerance: Patient tolerated treatment well ?Patient left: in chair;with call bell/phone within reach;with chair alarm set;with nursing/sitter in room;Other (comment) (NT present to assist pt with eating) ?Nurse Communication: Mobility status;Precautions;Other (comment) (pt's bottom very red) ?PT Visit Diagnosis: Muscle weakness (generalized) (M62.81);Other abnormalities of gait and mobility (R26.89);Unsteadiness on feet (R26.81) ?  ? ? ?Time: [id number] ?PT Time Calculation (min) (ACUTE ONLY): 53 min ? ?Charges:  $Therapeutic Activity: 23-37 mins          ?          ? ?Leitha Bleak, PT ?09/20/21, 10:59 AM ? ? ?

## 2021-09-20 NOTE — Progress Notes (Signed)
?PROGRESS NOTE ? ? ?HPI was taken from Dr. Joylene Clark: ?Joy Clark is a 71 y.o. female with medical history significant for nicotine dependence, chronic pain syndrome, dyslipidemia who was brought into the ER by EMS for evaluation of mental status changes for about 4 days. ?Patient is visiting her daughter here in [place] and has been here for about 4 weeks.  At baseline she is usually awake, alert and oriented to person place and time.  Patient's daughter states that her symptoms started about 4 days ago with chills and confusion.  Since then she has had multiple episodes of emesis and diarrhea and is now incontinent of stool which is loose and has lots of mucus in it.  Patient's oral intake has been very poor over the last 4 days as well. ?She is awake and oriented to person but is unable to follow commands. ?I am unable to do review of systems on this patient due to her mental status changes. ?  ?Review of Systems: unable to review all systems due to the inability of the patient to answer questions. ? ? ?As per Dr. Dareen Piano: ?Joy Clark is a 71 y.o. female with a PMH significant for nicotine dependence, chronic pain syndrome, HLD. ?They presented from home to the ED on 09/07/2021 with AMS x 4 days. Began having confusion and chills about 4 days ago, per daughter followed by multiple episodes of emesis, diarrhea and poor PO intake.  ?In the ED, it was found that they had hypokalemia, lactic acidosis, elevated troponins, pyuria. ?CT head showed mild atrophy and white matter microvascular ischemic changes. Nothing acute was identified. No previous to compare to. ?CT abdomen/pelvis positive for diffuse wall thickening of colon and distal small bowel consistent with inflammatory or infectious enterocolitis. Also signs of chronic vs acute cholecystitis. RUQ [place] positive for borderline gallbladder hydrops and positive sonographic murphy sign. Surgery was consulted.  ?They were treated with supportive care including  electrolyte repletion as well as antibiotics for UTI. ?  ?Patient was admitted to medicine service for further workup and management of AMS as outlined in detail below. ?  ?Overnight 3/1, patient had an event of Afib RVR which is presumably a new diagnosis for her. She was started on diltiazem bolus and drip which controlled her rate and she remained hemodynamically stable. Cardiology was consulted to evaluate.  ?  ?09/13/21 -stable, no acute events ? ? ?As per Dr. Mayford Knife 3/8-3/14/23: Pt was found to CVA on MRI, so neuro was consulted. Pt was still altered & WBC was trending up. LP was done to r/o meningitis/encephalitis (see neuro's note for more info). Pt was initially placed on IV vanco, amp, rocephin & acyclovir. S/p LP 09/19/21 which was neg for bacterial meningitis. Ampicillin & vanco have since been d/c as per ID. CSF HSV, VDRL,IgG are pending still.  CSF cryptococcal was neg. Today, mental status is much improved, pt is talking and answering questions appropriately. PT/OT evaluated pt and initially recs SNF but now recommending inpatient rehab.  ? Joy Clark  PYK:[id number] DOB: December 28, 1950 DOA: 09/07/2021 ?PCP: Pcp, No  ? ? ?Assessment & Plan: ?  ?Principal Problem: ?  Acute metabolic encephalopathy ?Active Problems: ?  Enterocolitis ?  UTI (urinary tract infection) ?  Hypokalemia ?  Abnormal CT scan, gallbladder ?  Altered mental status ?  Colitis ?  Dehydration ?  Gallbladder hydrops ?  Hyperthyroidism ?  Nausea vomiting and diarrhea ?  RUQ pain ?  Atrial fibrillation (HCC) ?  Hypernatremia ?  Aspiration into airway ?  Malnutrition of moderate degree ?  Dysphagia ?  Encephalitis ? 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 S/p LP 09/19/21, CSF neg for bacterial meningitis. Amp, vanco have been d/c as per ID. Continue on IV rocephin, acyclovir currently as per ID.   S/p EEG which showed mod-severe diffuse encephalopathy but no seizure or definite epileptiform discharges were seen. Neuro following and recs apprec  ? ?Acute metabolic encephalopathy: no hx of dementia as per pt's son. Likely secondary to CVA vs infection. Much improved today, sitting up in a chair & answering questions appropriately. Continue w/ supportive care. S/p LP 09/19/21, CSF neg for bacterial meningitis.  ? ?Dysphagia: pt pulled out NG tube 09/20/21. Started on dysphagia III diet as per speech  ?  ?A. fib: w/ RVR. New onset. Back in sinus rhythm. Continue on coreg, eliquis  ?  ?Low TSH:  signs of hyperthyroid. Possible Grave's disease. Thyroid US did not show suspicious nodules. T4 elevated, T3 normal. Continue on coreg, methimazole. Will need to f/u outpatient w/ endocrinology  ? ?HTN: uncontrolled. Continue on coreg, lisinopril, aldactone  ?  ?Potential for opioid withdrawal: family endorses that patient takes large amount of percocet scheduled for LE pain. Percocet prn  ?  ?Gallbladder disease: no urgent surgical intervention as per gen surg.  ? ?Leukocytosis: continues to trend down. Procal < 0.10. Continue on IV rocephin, acyclovir as per ID  ?  ?Hypokalemia: WNL  ? ?Hypernatremia: resolved  ?  ?UTI: completed abx course  ?  ?Enterocolitis: likely viral vs inflammatory. GI PCR panel, c. diff both neg  ? ?Likely moderate protein calorie malnutriton: pt pulled out tube feeds 09/20/21. Started on dysphagia III diet as per speech  ? ? ?DVT prophylaxis: eliquis  ?Code Status:  full  ?Family Communication: discussed pt's care w/ pt's daughter, Fleet ContrasRachel, and answered her questions  ?Disposition Plan:  possibly d/c to SNF  ? ?Level of care: Progressive ? ?Status is: Inpatient ?Remains inpatient appropriate because:  mental status much improved. Some CSF studies are still pending   ? ? ?Consultants:  ?Nephro  ?ID ? ?Procedures:  ? ?Antimicrobials: ? ? ?Subjective: ?Pt is alert, awake & answering questions  appropriately  ? ?Objective: ?Vitals:  ? 09/19/21 2042 09/20/21 0006 09/20/21 0500 09/20/21 0615  ?BP: (!) 114/42 (!) 123/53  (!) 140/57  ?Pulse: (!) 57 (!) 59  65  ?Resp: 20 (!) 23  18  ?Temp: 98 ?F (36.7 ?C) 97.8 ?F (36.6 ?C)  98.2 ?F (36.8 ?C)  ?TempSrc: Oral Oral  Oral  ?SpO2: 96% 96%  97%  ?Weight:   51.1 kg   ?Height:      ? ? ?Intake/Output Summary (Last 24 hours) at 09/20/2021 0751 ?Last data filed at 09/19/2021 2042 ?Gross per 24 hour  ?Intake 657.28 ml  ?Output 1250 ml  ?Net -592.72 ml  ? ?Filed Weights  ? 09/18/21 0500 09/19/21 0459 09/20/21 0500  ?Weight: 51.1 kg 50.5 kg 51.1 kg  ? ? ?Examination: ? ?General exam: Appears calm & comfortable. Frail appearing  ?Respiratory system: diminished breath sounds b/l. No wheezes, rales  ?Cardiovascular system: S1/S2+. No rubs or gallops ?Gastrointestinal system: Abd is soft, NT, ND & hyperactive bowel sounds  ?Central nervous system: Awake and alert. Follows simple commands   ?Psychiatry: judgement and insight appear improved. Flat mood and affect ? ? ? ?Data  Reviewed: I have personally reviewed following labs and imaging studies ? ?CBC: ?Recent Labs  ?Lab 09/16/21 ?0431 09/17/21 ?0611 09/18/21 ?0424 09/19/21 ?2993 09/20/21 ?0606  ?WBC 25.9* 28.8* 27.9* 18.7* 11.0*  ?HGB 15.6* 14.4 12.2 11.2* 11.6*  ?HCT 47.9* 43.1 35.8* 33.5* 34.2*  ?MCV 96.6 94.9 93.7 94.6 94.2  ?PLT 240 260 236 244 269  ? ?Basic Metabolic Panel: ?Recent Labs  ?Lab 09/13/21 ?1713 09/14/21 ?0620 09/16/21 ?0431 09/17/21 ?7169 09/18/21 ?0424 09/19/21 ?6789 09/20/21 ?0606  ?NA  --    < > 140 137 133* 135 134*  ?K  --    < > 4.5 4.5 4.1 4.0 3.8  ?CL  --    < > 106 104 101 102 102  ?CO2  --    < > 27 27 27 27 26   ?GLUCOSE  --    < > 137* 126* 126* 113* 134*  ?BUN  --    < > 36* 26* 21 20 15   ?CREATININE  --    < > 0.65 0.66 0.58 0.76 0.74  ?CALCIUM  --    < > 9.0 8.3* 8.0* 8.0* 8.2*  ?MG 1.8  --   --   --   --   --   --   ?PHOS 2.6  --   --   --   --   --   --   ? < > = values in this interval not  displayed.  ? ?GFR: ?Estimated Creatinine Clearance: 52 mL/min (by C-G formula based on SCr of 0.74 mg/dL). ?Liver Function Tests: ?Recent Labs  ?Lab 09/14/21 ?0620  ?AST 22  ?ALT 20  ?ALKPHOS 76  ?BILITOT 0.8  ?PROT

## 2021-09-20 NOTE — Progress Notes (Signed)
Nutrition Follow-up ? ?DOCUMENTATION CODES:  ? ?Non-severe (moderate) malnutrition in context of acute illness/injury ? ?INTERVENTION:  ? ?-D/c Osmolite, free water, and Prosource TF due to no feeding access ?-Ensure Enlive po TID, each supplement provides 350 kcal and 20 grams of protein ?-MVI with minerals daily  ? ?NUTRITION DIAGNOSIS:  ? ?Moderate Malnutrition related to acute illness (acute metabolic encepahlopathy) as evidenced by energy intake < or equal to 50% for > or equal to 5 days, mild fat depletion, moderate fat depletion, mild muscle depletion, moderate muscle depletion. ? ?Ongoing ? ?GOAL:  ? ?Patient will meet greater than or equal to 90% of their needs ? ?Progressing  ? ?MONITOR:  ? ?PO intake, Supplement acceptance, Diet advancement, Labs, Weight trends, Skin, I & O's ? ?REASON FOR ASSESSMENT:  ? ?Low Br[place], Consult ?Enteral/tube feeding initiation and management ? ?ASSESSMENT:  ? ?Joy Clark is a 71 y.o. female with medical history significant for nicotine dependence, chronic pain syndrome, dyslipidemia who was brought into the ER by EMS for evaluation of mental status changes for about 4 days.  Patient is visiting her daughter here in [place] and has been here for about 4 weeks.  At baseline she is usually awake, alert and oriented to person place and time.  Patient's daughter states that her symptoms started about 4 days ago with chills and confusion.  Since then she has had multiple episodes of emesis and diarrhea and is now incontinent of stool which is loose and has lots of mucus in it.  Patient's oral intake has been very poor over the last 4 days as well.  She is awake and oriented to person but is unable to follow commands. ? ?3/2- s/p BSE- advanced to full liquid diet ?3/3- s/p BSE- advanced to regular diet ?3/5- NGT placed (placement confirmed in stomach) ?3/6- TF initiated ?3/14- NGT pulled out by pt ? ?Reviewed I/O's: -593 ml x 24 hours and +12.1 L since admission ? ?UOP: 1.3  L x 24 hours  ? ?Visited pt this AM, who was receiving pericare from therapy. Noted that pt pulled her NGT out this morning after RD visit. Case discussed with RN and MD; no plans to replace NGT at this time. Plan to start dysphagia 3 diet. RD will add supplements to help maximize intake.  ? ?Per MD notes, mental status is improved and pt is answering questions appropriately.  ? ?Per therapy notes, pt is a potential CIR candidate.  ? ?Medications reviewed.  ? ?Labs reviewed: Na: 134, CBGS: 106-130 (inpatient orders for glycemic control are none).   ? ?Diet Order:   ?Diet Order   ? ?       ?  DIET DYS 3 Room service appropriate? Yes; Fluid consistency: Thin  Diet effective now       ?  ? ?  ?  ? ?  ? ? ?EDUCATION NEEDS:  ? ?Education needs have been addressed ? ?Skin:  Skin Assessment: Reviewed RN Assessment ? ?Last BM:  09/20/21 ? ?Height:  ? ?Ht Readings from Last 1 Encounters:  ?09/07/21 5\' 10"  (1.778 m)  ? ? ?Weight:  ? ?Wt Readings from Last 1 Encounters:  ?09/20/21 51.1 kg  ? ? ?Ideal Body Weight:  68.2 kg ? ?BMI:  Body mass index is 16.16 kg/m?. ? ?Estimated Nutritional Needs:  ? ?Kcal:  1900-2100 ? ?Protein:  95-110 grams ? ?Fluid:  > 1.9 L ? ? ? ?Loistine Chance, RD, LDN, CDCES ?Registered Dietitian II ?Certified Diabetes Care  and Education Specialist ?Please refer to [facility] for RD and/or RD on-call/weekend/after hours pager  ?

## 2021-09-21 ENCOUNTER — Inpatient Hospital Stay: Payer: Medicare Other

## 2021-09-21 DIAGNOSIS — G9340 Encephalopathy, unspecified: Secondary | ICD-10-CM

## 2021-09-21 DIAGNOSIS — G9341 Metabolic encephalopathy: Secondary | ICD-10-CM | POA: Diagnosis not present

## 2021-09-21 DIAGNOSIS — R4182 Altered mental status, unspecified: Secondary | ICD-10-CM | POA: Diagnosis not present

## 2021-09-21 DIAGNOSIS — I639 Cerebral infarction, unspecified: Secondary | ICD-10-CM

## 2021-09-21 DIAGNOSIS — D72829 Elevated white blood cell count, unspecified: Secondary | ICD-10-CM

## 2021-09-21 DIAGNOSIS — I10 Essential (primary) hypertension: Secondary | ICD-10-CM

## 2021-09-21 DIAGNOSIS — I4891 Unspecified atrial fibrillation: Secondary | ICD-10-CM | POA: Diagnosis not present

## 2021-09-21 LAB — CBC
HCT: 35.9 % — ABNORMAL LOW (ref 36.0–46.0)
Hemoglobin: 12.2 g/dL (ref 12.0–15.0)
MCH: 31.8 pg (ref 26.0–34.0)
MCHC: 34 g/dL (ref 30.0–36.0)
MCV: 93.5 fL (ref 80.0–100.0)
Platelets: 308 10*3/uL (ref 150–400)
RBC: 3.84 MIL/uL — ABNORMAL LOW (ref 3.87–5.11)
RDW: 12.5 % (ref 11.5–15.5)
WBC: 7.4 10*3/uL (ref 4.0–10.5)
nRBC: 0 % (ref 0.0–0.2)

## 2021-09-21 LAB — GLUCOSE, CAPILLARY
Glucose-Capillary: 106 mg/dL — ABNORMAL HIGH (ref 70–99)
Glucose-Capillary: 133 mg/dL — ABNORMAL HIGH (ref 70–99)
Glucose-Capillary: 106 mg/dL — ABNORMAL HIGH (ref 70–99)
Glucose-Capillary: 116 mg/dL — ABNORMAL HIGH (ref 70–99)
Glucose-Capillary: 133 mg/dL — ABNORMAL HIGH (ref 70–99)

## 2021-09-21 LAB — BASIC METABOLIC PANEL
Anion gap: 4 — ABNORMAL LOW (ref 5–15)
BUN: 11 mg/dL (ref 8–23)
CO2: 29 mmol/L (ref 22–32)
Calcium: 8.4 mg/dL — ABNORMAL LOW (ref 8.9–10.3)
Chloride: 99 mmol/L (ref 98–111)
Creatinine, Ser: 0.62 mg/dL (ref 0.44–1.00)
GFR, Estimated: >60 mL/min (ref >60)
Glucose, Bld: 103 mg/dL — ABNORMAL HIGH (ref 70–99)
Potassium: 3.4 mmol/L — ABNORMAL LOW (ref 3.5–5.1)
Sodium: 132 mmol/L — ABNORMAL LOW (ref 135–145)

## 2021-09-21 LAB — IGG CSF INDEX
Albumin CSF-mCnc: 22 mg/dL (ref 10–46)
Albumin: 2.6 g/dL — ABNORMAL LOW (ref 3.7–4.7)
CSF IgG Index: 0.6 (ref 0.0–0.7)
IgG (Immunoglobin G), Serum: 723 mg/dL (ref 586–1602)
IgG, CSF: 3.8 mg/dL (ref 0.0–6.7)
IgG/Alb Ratio, CSF: 0.17 (ref 0.00–0.25)

## 2021-09-21 LAB — CMV DNA BY PCR, QUALITATIVE
CMV DNA, Qual PCR: NEGATIVE
CMV DNA, Qual PCR: NEGATIVE

## 2021-09-21 LAB — VZV PCR, CSF: VZV PCR, CSF: NEGATIVE

## 2021-09-21 IMAGING — CT CT ANGIO HEAD-NECK (W OR W/O PERF)
2 of 11 series · 6 of 34 positions shown · IV contrast (APPLIED)
Comparison: MRI head [DATE].

CLINICAL DATA: Neuro deficit, acute, stroke suspected

EXAM:
CT ANGIOGRAPHY HEAD AND NECK
TECHNIQUE: Multidetector CT imaging of the head and neck was performed using
the standard protocol during bolus administration of intravenous
contrast. Multiplanar CT image reconstructions and MIPs were
obtained to evaluate the vascular anatomy. Carotid stenosis
measurements (when applicable) are obtained utilizing NASCET
criteria, using the distal internal carotid diameter as the
denominator.

[Series 11: ax thin · axial · 0.44mm/px · z∈[-304,-71]mm · 5 of 353 slices shown]
[im 59/353  soft-tissue]
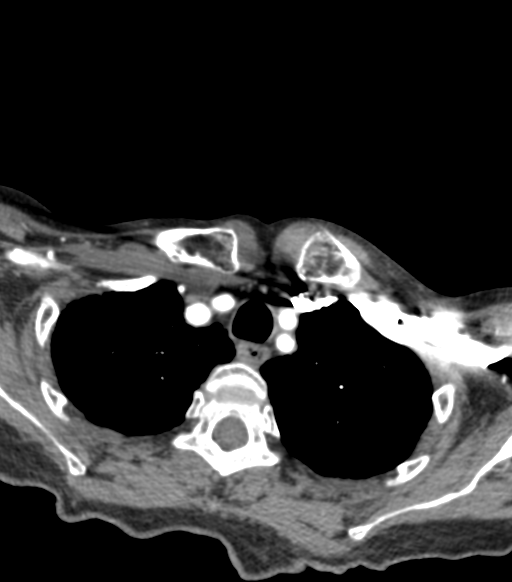
[im 118/353  bone]
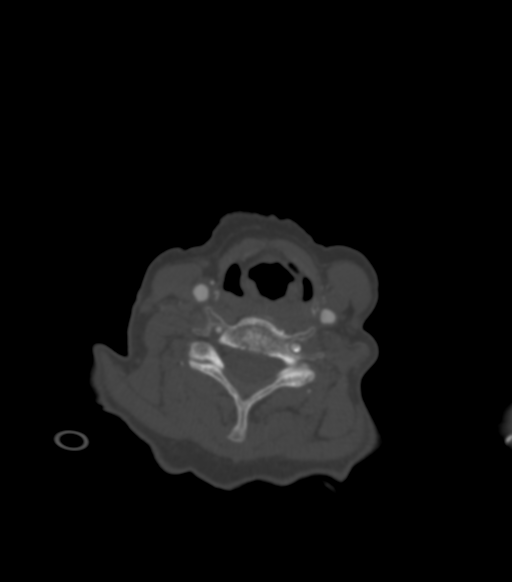
[im 177/353  soft-tissue]
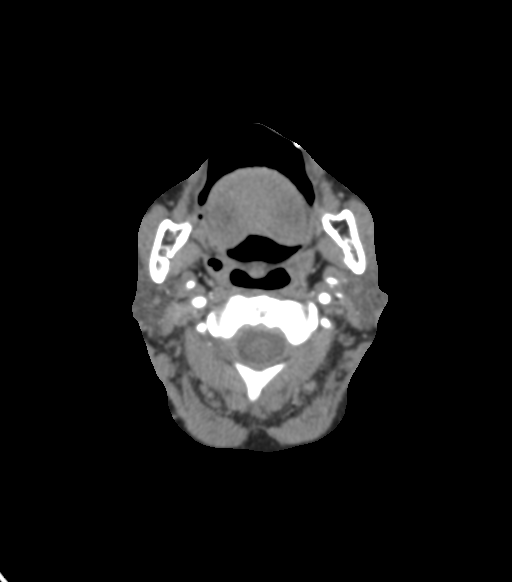
[im 235/353  bone]
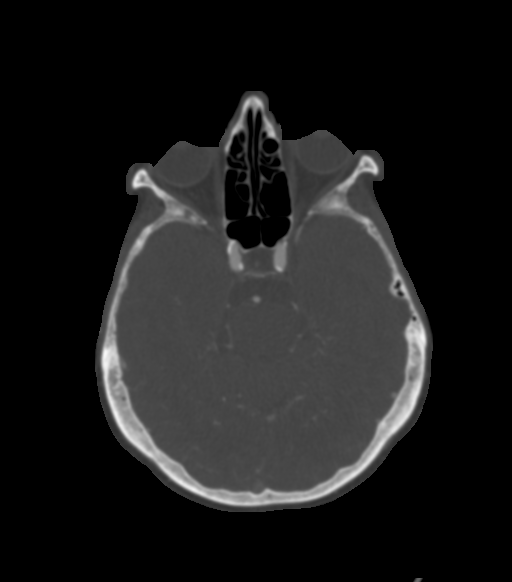
[im 294/353  soft-tissue]
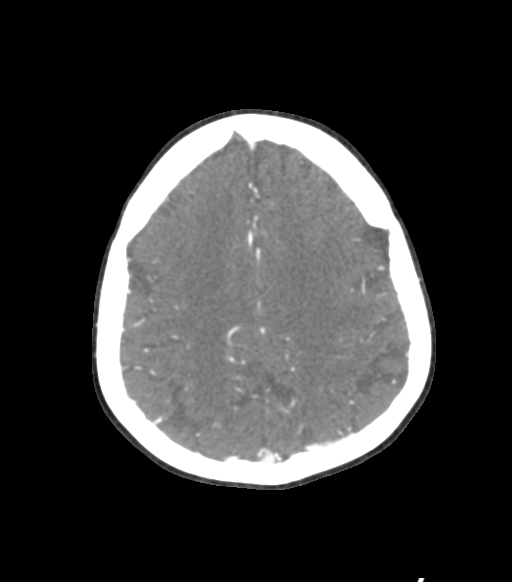

[Series 16: sagittal thin · sagittal · 0.52mm/px · 1 of 418 slices shown]
[im 68/418  soft-tissue]
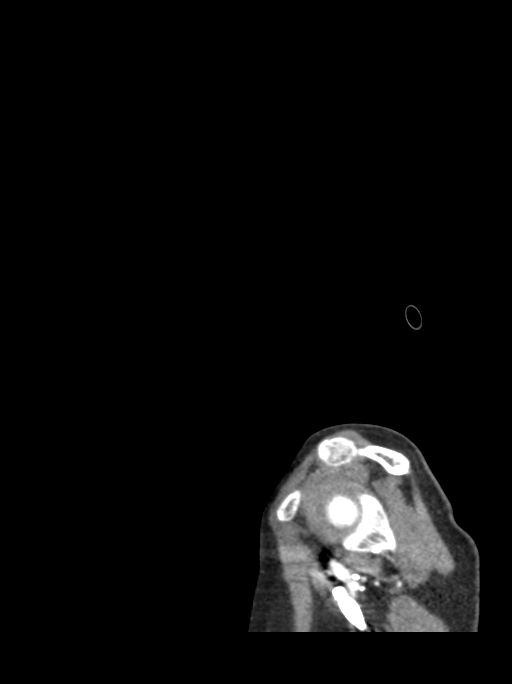

[6 of 34 positions shown; findings below may reference images not displayed]

RADIATION DOSE REDUCTION: This exam was performed according to the
departmental dose-optimization program which includes automated
exposure control, adjustment of the mA and/or kV according to
patient size and/or use of iterative reconstruction technique.

CONTRAST:  75mL OMNIPAQUE IOHEXOL 350 MG/ML SOLN
FINDINGS: CT HEAD FINDINGS

Brain: Mild edema in the region of the known left thalamocapsular
infarct. Known punctate right thalamocapsular infarct no appreciable
by CT. No evidence of new/interval acute large vascular territory
infarct. No acute hemorrhage, significant mass effect, midline
shift, hydrocephalus, or mass lesion. Atrophy with ex vacuo
ventricular dilation. Patchy white matter hypoattenuation,
nonspecific but compatible with chronic microvascular ischemic
disease. Partially empty sella.

Vascular: Detailed below.

Skull: No evidence of acute fracture.

Sinuses: Visualized sinuses are clear.

Orbits: No acute finding.

Review of the MIP images confirms the above findings

CTA NECK FINDINGS

Aortic arch: Atherosclerosis of the aorta and great vessel origins.
Approximately 50% stenosis of the left subclavian artery origin.

Right carotid system: Patent. Atherosclerosis at the carotid
bifurcation without greater than 50% narrowing.

Left carotid system: Mild atherosclerotic narrowing of the common
carotid artery origin. Mixed calcific and noncalcific
atherosclerosis at the carotid bifurcation with ulcerated plaque.
Resulting approximately 50-60% stenosis relative to the distal
vessel.

Vertebral arteries: Mild multifocal. Atherosclerotic narrowing of
the proximal right vertebral artery. Severe stenosis of the left
vertebral artery origin with additional multifocal moderate V2
segment narrowing.

Skeleton: Moderate multilevel degenerative change.

Other neck: No evidence of acute abnormality on limited assessment.

Upper chest: Visualized lung apices are clear.  Emphysema.

Review of the MIP images confirms the above findings

CTA HEAD FINDINGS

Anterior circulation: Mild atherosclerotic narrowing of bilateral
intracranial ICAs due to calcific atherosclerosis. Bilateral MCAs
are patent without proximal hemodynamically significant stenosis.
Patent bilateral ACAs. Moderate stenosis of the distal right A2 ACA.
No aneurysm identified.

Posterior circulation: Bilateral intradural vertebral arteries are
patent. Moderate stenosis of the intradural left vertebral artery.
The basilar artery and bilateral posterior cerebral arteries are
patent. Moderate stenosis of the proximal right P2 PCA with severe
stenosis of a more distal right P2 PCA branch. Patent left PCA
without significant proximal stenosis. Bilateral posterior
communicating arteries are patent with small P1 PCAs, anatomic
variant. No aneurysm identified.

Venous sinuses: As permitted by contrast timing, patent.

Anatomic variants: Detailed above.

Review of the MIP images confirms the above findings
IMPRESSION: CTA head:

1. No large vessel occlusion.
2. Moderate stenosis of the proximal right P2 PCA with severe
stenosis of a more distal right P2 PCA branch vessel.
3. Moderate left intradural vertebral artery and distal right A2 ACA
stenosis.

CTA neck:

1. Severe stenosis of the left vertebral artery origin with
additional multifocal moderate left and mild right V2 segment
narrowing.
2. Approximately 50-60% stenosis of the left ICA origin with
ulcerated atherosclerotic plaque.
3. Approximately 50% stenosis of the left subclavian artery origin.
4. Aortic Atherosclerosis ([I9]-[I9]) and Emphysema ([I9]-[I9]).

## 2021-09-21 MED ORDER — IOHEXOL 350 MG/ML SOLN
75.0000 mL | Freq: Once | INTRAVENOUS | Status: AC | PRN
  Administered 2021-09-21: 75 mL via INTRAVENOUS

## 2021-09-21 MED ORDER — POTASSIUM CHLORIDE 20 MEQ PO PACK
40.0000 meq | PACK | Freq: Once | ORAL | Status: AC
  Administered 2021-09-21: 40 meq via ORAL
  Filled 2021-09-21: qty 2

## 2021-09-21 NOTE — Progress Notes (Signed)
Physical Therapy Treatment ?Patient Details ?Name: Joy Clark ?MRN: [id number] ?DOB: 1951/01/19 ?Today's Date: 09/21/2021 ? ? ?History of Present Illness Joy Clark is a 71 y.o. female with medical history significant for nicotine dependence, chronic pain syndrome, dyslipidemia who was brought into the ER by EMS for evaluation of mental status changes for about 4 days.  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  S/p lumbar puncture 09/19/21. ? ?  ?PT Comments  ? ? Pt with flat affect and with extensive cuing needed to initiate most movements.  Pt required physical assistance with all functional tasks and once in standing was only able to take several very small shuffling steps before needing to return to sitting.  After two sessions of limited ambulation pt was unable to come to standing a third time.  Pt's SpO2 and HR were both WNL on room air. Pt will benefit from PT services in an IR setting upon discharge to safely address deficits listed in patient problem list for decreased caregiver assistance and eventual return to PLOF. ?  ?Recommendations for follow up therapy are one component of a multi-disciplinary discharge planning process, led by the attending physician.  Recommendations may be updated based on patient status, additional functional criteria and insurance authorization. ? ?Follow Up Recommendations ? Acute inpatient rehab (3hours/day) ?  ?  ?Assistance Recommended at Discharge Frequent or constant Supervision/Assistance  ?Patient can return home with the following A lot of help with walking and/or transfers;A lot of help with bathing/dressing/bathroom;Assistance with cooking/housework;Assistance with feeding;Direct supervision/assist for medications management;Direct supervision/assist for financial management;Assist for transportation;Help with stairs or ramp for entrance ?  ?Equipment  Recommendations ? BSC/3in1;Wheelchair (measurements PT);Wheelchair cushion (measurements PT);Hospital bed  ?  ?Recommendations for Other Services   ? ? ?  ?Precautions / Restrictions Precautions ?Precautions: Fall ?Precaution Comments: Aspiration ?Restrictions ?Weight Bearing Restrictions: No  ?  ? ?Mobility ? Bed Mobility ?Overal bed mobility: Needs Assistance ?Bed Mobility: Supine to Sit, Sit to Supine ?  ?  ?Supine to sit: Mod assist ?Sit to supine: Max assist ?  ?General bed mobility comments: Mod to max A for BLE and trunk control ?  ? ?Transfers ?Overall transfer level: Needs assistance ?Equipment used: Rolling walker (2 wheels) ?Transfers: Sit to/from Stand ?Sit to Stand: Mod assist, From elevated surface ?  ?  ?  ?  ?  ?General transfer comment: Max multi-modal cues to initiate standing and then mod A to come to full upright position ?  ? ?Ambulation/Gait ?Ambulation/Gait assistance: Mod assist ?Gait Distance (Feet): 1 Feet x 2 ?Assistive device: Rolling walker (2 wheels) ?Gait Pattern/deviations: Step-to pattern, Decreased step length - right, Decreased step length - left ?Gait velocity: decreased ?  ?  ?General Gait Details: Pt able to take several very small steps at the EOB with max cuing for sequencing and with Mod A for stability to prevent posterior LOB ? ? ?Stairs ?  ?  ?  ?  ?  ? ? ?Wheelchair Mobility ?  ? ?Modified Rankin (Stroke Patients Only) ?  ? ? ?  ?Balance Overall balance assessment: Needs assistance ?Sitting-balance support: Bilateral upper extremity supported, Feet supported ?Sitting balance-[name]: Fair ?  ?  ?Standing balance support: During functional activity, Bilateral upper extremity supported ?Standing balance-[name]: Poor ?Standing balance comment: posterior lean noted with standing activities (with RW use) ?  ?  ?  ?  ?  ?  ?  ?  ?  ?  ?  ?  ? ?  ?  Cognition Arousal/Alertness: Awake/alert ?Behavior During Therapy: Flat affect ?Overall Cognitive Status: Impaired/Different  from baseline ?  ?  ?  ?  ?  ?  ?  ?  ?  ?  ?  ?  ?  ?  ?  ?  ?  ?  ?  ? ?  ?Exercises Total Joint Exercises ?Ankle Circles/Pumps: AROM, Strengthening, Both, 10 reps ?Other Exercises ?Other Exercises: Static unsupported sitting at EOB for core strengthening and improved activity tolerance ? ?  ?General Comments   ?  ?  ? ?Pertinent Vitals/Pain Pain Assessment ?Pain Assessment: 0-10 ?Pain Score: 9  ?Pain Location: bottom ?Pain Descriptors / Indicators: Sore ?Pain Intervention(s): Repositioned, Premedicated before session, Monitored during session  ? ? ?Home Living   ?  ?  ?  ?  ?  ?  ?  ?  ?  ?   ?  ?Prior Function    ?  ?  ?   ? ?PT Goals (current goals can now be found in the care plan section) Progress towards PT goals: PT to reassess next treatment ? ?  ?Frequency ? ? ? 7X/week ? ? ? ?  ?PT Plan Frequency needs to be updated  ? ? ?Co-evaluation   ?  ?  ?  ?  ? ?  ?AM-PAC PT "6 Clicks" Mobility   ?Outcome Measure ? Help needed turning from your back to your side while in a flat bed without using bedrails?: A Little ?Help needed moving from lying on your back to sitting on the side of a flat bed without using bedrails?: A Lot ?Help needed moving to and from a bed to a chair (including a wheelchair)?: A Lot ?Help needed standing up from a chair using your arms (e.g., wheelchair or bedside chair)?: A Lot ?Help needed to walk in hospital room?: Total ?Help needed climbing 3-5 steps with a railing? : Total ?6 Click Score: 11 ? ?  ?End of Session Equipment Utilized During Treatment: Gait belt ?Activity Tolerance: Patient tolerated treatment well ?Patient left: in bed;with call bell/phone within reach;with bed alarm set;with family/visitor present ?Nurse Communication: Mobility status ?PT Visit Diagnosis: Muscle weakness (generalized) (M62.81);Other abnormalities of gait and mobility (R26.89);Unsteadiness on feet (R26.81) ?  ? ? ?Time: 9702-6378 ?PT Time Calculation (min) (ACUTE ONLY): 31 min ? ?Charges:  $Therapeutic  Exercise: 8-22 mins ?$Therapeutic Activity: 8-22 mins          ?          ?D. Elly Modena PT, DPT ?09/21/21, 5:19 PM ? ? ?

## 2021-09-21 NOTE — Progress Notes (Signed)
?Progress Note ? ? ?Patient: Joy Clark LHT:[id number] DOB: 06/07/1951 DOA: 09/07/2021     14 ?DOS: the patient was seen and examined on 09/21/2021 ?  ?Brief hospital course: ?This 71 years old female with PMH significant for nicotine dependence, chronic pain syndrome, hyperlipidemia who was brought in the ED for the evaluation of altered mentation for last 4 days.  Patient is visiting her daughter in [place] and has been here for last 4 weeks.  Daughter reports patient having multiple episodes of emesis, diarrhea and poor p.o. intake.  In the ED she was found to have hypokalemia,  lactic acidosis,  elevated troponin and pyuria.  CT head was unremarkable.  CT abdomen and pelvis shows inflammatory or infectious enterocolitis.  Ultrasound abdomen shows borderline gallbladder hydrops and+ Murphy sign.  She was admitted for altered mentation. ?Hospital course complicated by A-fib with RVR, started on Cardizem drip,  cardiology was consulted now she is hemodynamically stable.  Patient was found to have CVA on MRI,  so neurology was consulted. ?Patient continued to have altered mentation and progressive leukocytosis.  LP was done to rule out meningitis / Encephalitis.  She was initially started on empiric vancomycin, Ampicillin,  Rocephin and acyclovir.  CSF is negative for bacterial meningitis.  Infectious diseases consulted recommended to continue ceftriaxone and acyclovir until cultures comes back.  Neurology recommended CTA head and neck to complete the stroke work-up.  PT recommended SNF now recommending acute rehab. ? ?Assessment and Plan: ?* Acute metabolic encephalopathy ?Patient presented for the evaluation of altered mentation. ?Initial impression was could be UTI or metabolic encephalopathy ?She was started on antibiotics, continued to remain with altered mentation. 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 ?Son denies any history of dementia. ?S/p LP 09/19/21, CSF negative for  bacterial meningitis. ?Patient is much improved today, sitting up in the chair,  answers questions appropriately. ?Continue with supportive care. ? ?CVA (cerebral vascular accident) [facility]) ?MRI shows small acute infarct in the left thalamocapsular region. ?S/p LP 09/19/2021.  CSF negative for bacterial meningitis. ?Ampicillin, vancomycin has been discontinued as per ID. ?Continue IV Rocephin, acyclovir until cultures comes back. ?EEG shows moderate severe diffuse encephalopathy but no evidence of seizures. ?Neurology recommended CTA head and neck to complete stroke work-up. ? ?HTN (hypertension) ?Continue Coreg, lisinopril, Aldactone. ? ?Dysphagia ?Speech and swallow evaluation completed. ?Recommended dysphagia 3 diet. ? ?Atrial fibrillation (HCC) ?Patient found to have a new onset A-fib.  Now back to sinus rhythm.   ?Heart rate well controlled.  Continue Coreg and Eliquis. ? ?Hyperthyroidism ?Thyroid ultrasound did not show suspicious nodules. ?TSH low, T4 elevated T3 normal. ?Continue Coreg, methimazole. ?Outpatient follow-up with endocrinology. ? ?Gallbladder hydrops ?General surgery was consulted.  No urgent surgical intervention needed. ? ?Hypokalemia ?Replaced.  Continue to monitor ? ?UTI (urinary tract infection) ?Completed course of antibiotics. ? ?Enterocolitis ?Likely viral versus inflammatory. ?C. difficile, GI PCR panel negative. ? ? ?Leucocytosis-resolved as of 09/21/2021 ?Trending down.  Procalcitonin <0.10,  ?Continue Rocephin, acyclovir as per infectious disease. ? ? ?Nutrition Status: ?Nutrition Problem: Moderate Malnutrition ?Etiology: acute illness (acute metabolic encepahlopathy) ?Signs/Symptoms: energy intake < or equal to 50% for > or equal to 5 days, mild fat depletion, moderate fat depletion, mild muscle depletion, moderate muscle depletion ?Interventions: Ensure Enlive (each supplement provides 350kcal and 20 grams of protein), MVI ?  ?Subjective: Patient was seen and examined at bedside.   Overnight events noted. ?She reports feeling much improved.  She is fully aware about  person, place, time. ?She denies any chest pain or shortness of breath, able to move all extremities. ? ?Physical Exam: ?Vitals:  ? 09/21/21 0326 09/21/21 0327 09/21/21 0802 09/21/21 1121  ?BP:  (!) 138/57 137/60 (!) 127/110  ?Pulse:  61 63 62  ?Resp:  18 18 18   ?Temp:  97.9 ?F (36.6 ?C) (!) 97.5 ?F (36.4 ?C) 97.7 ?F (36.5 ?C)  ?TempSrc:   Oral Oral  ?SpO2:  97% 97% 98%  ?Weight: 49.4 kg     ?Height:      ? ?General exam: Appears comfortable, not in any acute distress, deconditioned. ?Respiratory : CTA bilaterally, no wheezing, no crackles, normal respiratory effort. ?Cardiovascular : S1-S2 heard, regular rate and rhythm, no murmur. ?Gastrointestinal system: Abdominal soft, nontender, nondistended, BS+ ?Central nervous system: Alert, oriented x 3, no focal neurological deficits. ?Extremities: No edema, no cyanosis, no clubbing, ?Psychiatry: Mood, insight, judgment appropriate ? ? ? ?Data Reviewed: ?I have Reviewed nursing notes, Vitals, and Lab results since pt's last encounter. Pertinent lab results CBC, BMP, CSF ?I have ordered test including CBC, BMP ?I have reviewed the last note from neurology, infectious disease, cardiology,  ?I have discussed pt's care plan and test results with patient.  ? ?Family Communication: No family at bedside ? ?Disposition: ?Status is: Inpatient ?Remains inpatient appropriate because: Admitted for altered mentation initial impression was UTI found to have a CVA, status post LP, negative for bacterial meningitis, remains on IV acyclovir and Rocephin until cultures comes back.  PT recommended acute rehab. ? ? ?Planned Discharge Destination: Rehab ? ? ? ?Time spent: 50 minutes ? ?Author: ? , MD ?09/21/2021 11:57 AM ? ?For on call review [email or web address].  ?

## 2021-09-21 NOTE — Assessment & Plan Note (Signed)
General surgery was consulted.  No urgent surgical intervention needed. ?

## 2021-09-21 NOTE — Assessment & Plan Note (Addendum)
Thyroid ultrasound did not show suspicious nodules. ?TSH low, T4 elevated,  T3 normal. ?Continue Coreg, methimazole. ?Outpatient follow-up with endocrinology. ?

## 2021-09-21 NOTE — Assessment & Plan Note (Addendum)
MRI showed small acute infarct in the left thalamocapsular region. ?S/p LP on 09/19/2021.  CSF negative for bacterial meningitis. ?Ampicillin, vancomycin has been discontinued as per ID. ?Rocephin discontinued.  Acyclovir discontinued due to HSV cultures negative ?EEG shows moderate severe diffuse encephalopathy but no evidence of seizures. 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 ?Stroke work completed.  Neurology recommended to continue Eliquis, Outpatient neurology follow-up ?PCP follow-up on pending lab work. ?

## 2021-09-21 NOTE — Assessment & Plan Note (Addendum)
Speech and swallow evaluation completed. ?Recommended dysphagia 3 diet. ?

## 2021-09-21 NOTE — Assessment & Plan Note (Signed)
Continue Coreg, lisinopril, Aldactone. ?

## 2021-09-21 NOTE — Progress Notes (Signed)
? ? ? ? ? ? ?Subjective: ? ?No new complaints. ? ? ?Antibiotics:  ?Anti-infectives (From admission, onward)  ? ? Start     Dose/Rate Route Frequency Ordered Stop  ? 09/17/21 2200  acyclovir (ZOVIRAX) 500 mg in dextrose 5 % 100 mL IVPB  Status:  Discontinued       ? 500 mg ?110 mL/hr over 60 Minutes Intravenous Every 8 hours 09/17/21 1038 09/17/21 1050  ? 09/17/21 1400  acyclovir (ZOVIRAX) 500 mg in dextrose 5 % 100 mL IVPB       ? 500 mg ?110 mL/hr over 60 Minutes Intravenous Every 8 hours 09/17/21 1050    ? 09/17/21 1200  ampicillin (OMNIPEN) 2 g in sodium chloride 0.9 % 100 mL IVPB  Status:  Discontinued       ? 2 g ?300 mL/hr over 20 Minutes Intravenous Every 4 hours 09/17/21 1036 09/19/21 1437  ? 09/16/21 2300  vancomycin (VANCOCIN) IVPB 1000 mg/200 mL premix  Status:  Discontinued       ?See Hyperspace for full Linked Orders Report.  ? 1,000 mg ?200 mL/hr over 60 Minutes Intravenous Every 24 hours 09/15/21 2157 09/17/21 1027  ? 09/15/21 2300  acyclovir (ZOVIRAX) 485 mg in dextrose 5 % 100 mL IVPB  Status:  Discontinued       ? 10 mg/kg ? 48.4 kg ?109.7 mL/hr over 60 Minutes Intravenous Every 12 hours 09/15/21 2146 09/17/21 1038  ? 09/15/21 2300  cefTRIAXone (ROCEPHIN) 2 g in sodium chloride 0.9 % 100 mL IVPB  Status:  Discontinued       ? 2 g ?200 mL/hr over 30 Minutes Intravenous Every 12 hours 09/15/21 2146 09/20/21 1515  ? 09/15/21 2300  ampicillin (OMNIPEN) 2 g in sodium chloride 0.9 % 100 mL IVPB  Status:  Discontinued       ? 2 g ?300 mL/hr over 20 Minutes Intravenous Every 6 hours 09/15/21 2206 09/17/21 1036  ? 09/15/21 2245  vancomycin (VANCOREADY) IVPB 1250 mg/250 mL       ?See Hyperspace for full Linked Orders Report.  ? 1,250 mg ?166.7 mL/hr over 90 Minutes Intravenous  Once 09/15/21 2157 09/16/21 1911  ? 09/08/21 1200  cefTRIAXone (ROCEPHIN) 2 g in sodium chloride 0.9 % 100 mL IVPB       ? 2 g ?200 mL/hr over 30 Minutes Intravenous Every 24 hours 09/07/21 1506 09/13/21 1447  ? 09/07/21 2200   metroNIDAZOLE (FLAGYL) IVPB 500 mg  Status:  Discontinued       ? 500 mg ?100 mL/hr over 60 Minutes Intravenous Every 12 hours 09/07/21 1506 09/08/21 1407  ? 09/07/21 1445  metroNIDAZOLE (FLAGYL) IVPB 500 mg       ? 500 mg ?100 mL/hr over 60 Minutes Intravenous  Once 09/07/21 1435 09/07/21 1545  ? 09/07/21 1230  cefTRIAXone (ROCEPHIN) 1 g in sodium chloride 0.9 % 100 mL IVPB       ? 1 g ?200 mL/hr over 30 Minutes Intravenous  Once 09/07/21 1217 09/07/21 1320  ? 09/07/21 1230  azithromycin (ZITHROMAX) 500 mg in sodium chloride 0.9 % 250 mL IVPB       ? 500 mg ?250 mL/hr over 60 Minutes Intravenous  Once 09/07/21 1217 09/07/21 1424  ? ?  ? ? ?Medications: ?Scheduled Meds: ? apixaban  5 mg Oral BID  ? atorvastatin  40 mg Per Tube Daily  ? feeding supplement  237 mL Oral TID BM  ? lisinopril  40 mg Per Tube Daily  ?  mouth rinse  15 mL Mouth Rinse BID  ? methimazole  5 mg Per Tube Daily  ? pantoprazole (PROTONIX) IV  40 mg Intravenous Q24H  ? spironolactone  50 mg Per Tube Daily  ? ?Continuous Infusions: ? sodium chloride Stopped (09/19/21 1648)  ? acyclovir Stopped (09/21/21 1542)  ? dextrose 50 mL/hr at 09/21/21 1819  ? ?PRN Meds:.sodium chloride, albuterol, ondansetron **OR** ondansetron (ZOFRAN) IV, oxyCODONE-acetaminophen ? ? ? ?Objective: ?Weight change: -1.7 kg ? ?Intake/Output Summary (Last 24 hours) at 09/21/2021 2027 ?Last data filed at 09/21/2021 1900 ?Gross per 24 hour  ?Intake 1701.42 ml  ?Output 2400 ml  ?Net -698.58 ml  ? ? ?Blood pressure (!) 115/54, pulse 67, temperature 97.6 ?F (36.4 ?C), resp. rate 18, height 5\' 10"  (1.778 m), weight 49.4 kg, SpO2 99 %. ?Temp:  [97.5 ?F (36.4 ?C)-98.6 ?F (37 ?C)] 97.6 ?F (36.4 ?C) (03/15 2017) ?Pulse Rate:  [61-75] 67 (03/15 2017) ?Resp:  [18] 18 (03/15 2017) ?BP: (115-138)/(54-110) 115/54 (03/15 2017) ?SpO2:  [97 %-99 %] 99 % (03/15 2017) ?Weight:  [49.4 kg] 49.4 kg (03/15 0326) ? ?Physical Exam: ?Physical Exam ?Vitals reviewed.  ?HENT:  ?   Head: Normocephalic and  atraumatic.  ?Cardiovascular:  ?   Rate and Rhythm: Bradycardia present. Rhythm irregular.  ?Pulmonary:  ?   Effort: Pulmonary effort is normal. No respiratory distress.  ?   Breath sounds: No wheezing.  ?Abdominal:  ?   General: Bowel sounds are normal. There is no distension.  ?   Palpations: There is no mass.  ?   Tenderness: There is no abdominal tenderness.  ?   Hernia: No hernia is present.  ?Musculoskeletal:     ?   General: Normal range of motion.  ?Skin: ?   Coloration: Skin is pale.  ?   Findings: No rash.  ?Neurological:  ?   General: No focal deficit present.  ?   Mental Status: She is alert.  ?Psychiatric:     ?   Mood and Affect: Mood normal. Mood is not depressed.     ?   Speech: Speech is delayed.     ?   Behavior: Behavior is cooperative.     ?   Cognition and Memory: Cognition is not impaired. Memory is impaired. She exhibits impaired recent memory. She does not exhibit impaired remote memory.  ?  ?She knows that she is in the hospital but had a hard time coming up with the city she was in initially saying that it was not [place]. ? ?She recognizes her daughter who is at the bedside ? ? ?CBC: ? ? ? ?BMET ?Recent Labs  ?  09/20/21 ?0606 09/21/21 ?0503  ?NA 134* 132*  ?K 3.8 3.4*  ?CL 102 99  ?CO2 26 29  ?GLUCOSE 134* 103*  ?BUN 15 11  ?CREATININE 0.74 0.62  ?CALCIUM 8.2* 8.4*  ? ? ? ? ?Liver Panel ? ?Recent Labs  ?  09/19/21 ?1229  ?ALBUMIN 2.6*  ? ? ? ? ? ?Sedimentation Rate ?No results for input(s): ESRSEDRATE in the last 72 hours. ?C-Reactive Protein ?No results for input(s): CRP in the last 72 hours. ? ?Micro Results: ?Recent Results (from the past 720 hour(s))  ?Blood culture (single)     Status: None  ? Collection Time: 09/07/21 11:09 AM  ? Specimen: BLOOD LEFT ARM  ?Result Value Ref Range Status  ? Specimen Description BLOOD LEFT ARM  Final  ? Special Requests   Final  ?  BOTTLES  DRAWN AEROBIC AND ANAEROBIC Blood Culture adequate volume  ? Culture   Final  ?  NO GROWTH 5 DAYS ?Performed at  [facility], [address] [place], [place] [address] ?  ? Report Status 09/12/2021 FINAL  Final  ?Resp Panel by RT-PCR (Flu A&B, Covid) Nasopharyngeal Swab     Status: None  ? Collection Time: 09/07/21 11:09 AM  ? Specimen: Nasopharyngeal Swab; Nasopharyngeal(NP) swabs in vial transport medium  ?Result Value Ref Range Status  ? SARS Coronavirus 2 by RT PCR NEGATIVE NEGATIVE Final  ?  Comment: (NOTE) ?SARS-CoV-2 target nucleic acids are NOT DETECTED. ? ?The SARS-CoV-2 RNA is generally detectable in upper respiratory ?specimens during the acute phase of infection. The lowest ?concentration of SARS-CoV-2 viral copies this assay can detect is ?138 copies/mL. A negative result does not preclude SARS-Cov-2 ?infection and should not be used as the sole basis for treatment or ?other patient management decisions. A negative result may occur with  ?improper specimen collection/handling, submission of specimen other ?than nasopharyngeal swab, presence of viral mutation(s) within the ?areas targeted by this assay, and inadequate number of viral ?copies(<138 copies/mL). A negative result must be combined with ?clinical observations, patient history, and epidemiological ?information. The expected result is Negative. ? ?Fact Sheet for Patients:  ?BloggerCourse.comhttps://www.fda.gov/media/152166/download ? ?Fact Sheet for Healthcare Providers:  ?SeriousBroker.ithttps://www.fda.gov/media/152162/download ? ?This test is no t yet approved or cleared by the [place] FDA and  ?has been authorized for detection and/or diagnosis of SARS-CoV-2 by ?FDA under an Emergency Use Authorization (EUA). This EUA will remain  ?in effect (meaning this test can be used) for the duration of the ?COVID-19 declaration under Section 564(b)(1) of the Act, 21 ?U.S.C.section 360bbb-3(b)(1), unless the authorization is terminated  ?or revoked sooner.  ? ? ?  ? Influenza A by PCR NEGATIVE NEGATIVE Final  ? Influenza B by PCR NEGATIVE NEGATIVE Final  ?  Comment: (NOTE) ?The  Xpert Xpress SARS-CoV-2/FLU/RSV plus assay is intended as an aid ?in the diagnosis of influenza from Nasopharyngeal swab specimens and ?should not be used as a sole basis for treatment. Nasal washings and ?as

## 2021-09-21 NOTE — Hospital Course (Addendum)
This 71 years old female with PMH significant for Nicotine dependence, chronic pain syndrome, hyperlipidemia who was brought in the ED for the evaluation of altered mentation for last 4 days. Patient is visiting her daughter in [place] and has been here for last 4 weeks. Daughter reports patient had multiple episodes of emesis, diarrhea and poor p.o. intake.  In the ED she was found to have hypokalemia,  lactic acidosis,  elevated troponin and pyuria.  CT head was unremarkable.  CT abdomen and pelvis shows inflammatory or infectious enterocolitis.  Ultrasound abdomen shows borderline gallbladder hydrops and+ Murphy sign.  She was admitted for altered mentation. ?Hospital course complicated by A-fib with RVR, started on Cardizem drip,  Cardiology was consulted, She remains hemodynamically stable. Patient was found to have CVA on MRI,  so Neurology was consulted. CVA workup completed, recommended outpatient Neurology follow up. Patient continued to have altered mentation and progressive leukocytosis.  LP was done to rule out meningitis / Encephalitis.  She was initially started on empiric antibiotics ( vancomycin, Ampicillin,  Rocephin and acyclovir).  CSF is negative for bacterial meningitis.  Infectious diseases consulted recommended to continue ceftriaxone and acyclovir until cultures comes back.  ?HSV culture came back negative.  Patient has significantly improved and back to her baseline mental status.  PT has recommended acute rehab.  Since patient is from [facility], family requesting discharge home with daughter in [place], and then she will fly to [facility] next week.  Follow-up with primary care physician in [place].  ?

## 2021-09-21 NOTE — Progress Notes (Signed)
Subjective: ?Much more alert today. Able to answer questions with more than "yes," although she reports the year as 1900. ? ?LP results:  ?WBC 7 ?RBC 2 ?Protein 45 ?Glucose 64 ?Crypto antigen - neg ?Pending: culture, fungal culture, acid fast smear, HSV, VZV, CMV, IgG index ? ?Objective: ?Current vital signs: ?BP 137/60 (BP Location: Left Arm)   Pulse 63   Temp (!) 97.5 ?F (36.4 ?C)   Resp 18   Ht 5\' 10"  (1.778 m)   Wt 49.4 kg   SpO2 97%   BMI 15.63 kg/m?  ?Vital signs in last 24 hours: ?Temp:  [97.5 ?F (36.4 ?C)-98.6 ?F (37 ?C)] 97.5 ?F (36.4 ?C) (03/15 0802) ?Pulse Rate:  [61-67] 63 (03/15 0802) ?Resp:  [16-19] 18 (03/15 0802) ?BP: (119-158)/(54-71) 137/60 (03/15 0802) ?SpO2:  [96 %-100 %] 97 % (03/15 0802) ?Weight:  [49.4 kg] 49.4 kg (03/15 0326) ? ?Intake/Output from previous day: ?03/14 0701 - 03/15 0700 ?In: 2397.6 [P.O.:70; I.V.:1487.6; IV [id number] ?Out: 1200 [Urine:200; Emesis/NG output:1000] ?Intake/Output this shift: ?No intake/output data recorded. ?Nutritional status:  ?Diet Order   ? ?       ?  DIET DYS 3 Room service appropriate? Yes; Fluid consistency: Thin  Diet effective now       ?  ? ?  ?  ? ?  ? ? ?HEENT: [place]/AT ?Lungs: Respirations unlabored. Tachypnea has resolved. ?Ext: No edema ?  ?Neurologic Exam: ?Mental Status: Awake and not following commands today, oriented to self, states year is 1900.Tracks examiner briskly. ?Cranial Nerves: ?II: Glances at examiner at times; does so more purposefully than yesterday.   ?III,IV, VI: No ptosis. Eyes are conjugate. Will track examiner to left and right as she walks from side to side. No nystagmus.  ?VII: Face is symmetric.  ?VIII: Will glance towards examiner in response to loud calling of her name  ?XI: Head is midline ?XII: Does not protrude tongue. ?Motor/Sensory: ?BUE. Some movement anti-gravity BUE (does not follow commands but will occasionally mimic) ?BLE: Purposefully moving BLE today. Moves in response to noxious stimuli bilaterally.    ?Diffusely decreased muscle bulk in all 4 extremities. Decreased tone in BUE.  ?Does not follow any commands for motor testing.  ?Cerebellar/Gait: Unable to assess ? ?Lab Results: ?Results for orders placed or performed during the hospital encounter of 09/07/21 (from the past 48 hour(s))  ?Glucose, capillary     Status: Abnormal  ? Collection Time: 09/19/21 11:45 AM  ?Result Value Ref Range  ? Glucose-Capillary 119 (H) 70 - 99 mg/dL  ?  Comment: Glucose reference range applies only to samples taken after fasting for at least 8 hours.  ?Cryptococcal antigen, CSF ([facility] only)     Status: None  ? Collection Time: 09/19/21 12:29 PM  ?Result Value Ref Range  ? Crypto Ag NEGATIVE NEGATIVE  ? Cryptococcal Ag Titer NOT INDICATED NOT INDICATED  ?  Comment: Performed at [facility], [place] [address] [place], [place] [address]  ?Glucose, CSF     Status: None  ? Collection Time: 09/19/21 12:29 PM  ?Result Value Ref Range  ? Glucose, CSF 64 40 - 70 mg/dL  ?  Comment: Performed at [facility], [address] [place],  [address]  ?Protein, CSF     Status: None  ? Collection Time: 09/19/21 12:29 PM  ?Result Value Ref Range  ? Total  Protein, CSF 45 15 - 45 mg/dL  ?  Comment: Performed at [facility], [address] [place], [place] [address]  ?CSF cell count with differential     Status: Abnormal  ? Collection Time: 09/19/21 12:29 PM  ?Result Value Ref Range  ? Tube # 3   ? Color, CSF COLORLESS COLORLESS  ? Appearance, CSF CLEAR CLEAR  ? Supernatant NOT INDICATED   ? RBC Count, CSF 2 0 - 3 /cu mm  ? WBC, CSF 7 (H) 0 - 5 /cu mm  ? Segmented Neutrophils-CSF 0 %  ? Lymphs, CSF 44 %  ? Monocyte-Macrophage-Spinal Fluid 56 %  ? Eosinophils, CSF 0 %  ?  Comment: Performed at [facility], [address], [place], [place] [address]  ?CSF culture w Gram Stain     Status: None (Preliminary result)  ? Collection Time: 09/19/21 12:29 PM  ? Specimen: PATH Cytology CSF; Cerebrospinal Fluid  ?Result  Value Ref Range  ? Specimen Description    ?  CSF ?Performed at [facility], [address], [place], [place] [address] ?  ? Special Requests    ?  NONE ?Performed at [facility], [address], [place], [place] [address] ?  ? Gram Stain    ?  WBC SEEN ?RED BLOOD CELLS ?NO ORGANISMS SEEN ?GRAM STAIN REVIEWED-AGREE WITH RESULT ?Performed at [facility], [place] [address] [place], [place] [address] ?  ? Culture PENDING   ? Report Status PENDING   ?HIV-1 RNA, PCR (Graph) Rfx/Geno EDI     Status: None  ? Collection Time: 09/19/21  2:24 PM  ?Result Value Ref Range  ? HIV-1 RNA Quant, Log UNABLE TO CALCULATE log10copy/mL  ?  Comment: (NOTE) ?Unable to calculate result since non-numeric result obtained for ?component test. ?  ? HIV-1 RNA BY PCR <20 copies/mL  ?  Comment: (NOTE) ?HIV-1 RNA not detected ?The reportable range for this assay is 20 to 10,000,000 ?copies HIV-1 RNA/mL. ?Performed At: [facility] ?[address] [place], [place] [id number] ?Rush Farmer MD [id number] ?  ? Genotype Assay RTNI   ?  Comment: Not indicated  ?Glucose, capillary     Status: Abnormal  ? Collection Time: 09/19/21  4:23 PM  ?Result Value Ref Range  ? Glucose-Capillary 124 (H) 70 - 99 mg/dL  ?  Comment: Glucose reference range applies only to samples taken after fasting for at least 8 hours.  ?Glucose, capillary     Status: Abnormal  ? Collection Time: 09/19/21  8:47 PM  ?Result Value Ref Range  ? Glucose-Capillary 129 (H) 70 - 99 mg/dL  ?  Comment: Glucose reference range applies only to samples taken after fasting for at least 8 hours.  ?Glucose, capillary     Status: Abnormal  ? Collection Time: 09/20/21 12:12 AM  ?Result Value Ref Range  ? Glucose-Capillary 106 (H) 70 - 99 mg/dL  ?  Comment: Glucose reference range applies only to samples taken after fasting for at least 8 hours.  ?Glucose, capillary     Status: Abnormal  ? Collection Time: 09/20/21  4:57 AM  ?Result Value Ref Range  ?  Glucose-Capillary 130 (H) 70 - 99 mg/dL  ?  Comment: Glucose reference range applies only to samples taken after fasting for at least 8 hours.  ?CBC     Status: Abnormal  ? Collection Time: 09/20/21  6:06 AM  ?Result Value Ref Range  ? WBC 11.0 (H) 4.0 - 10.5 K/uL  ? RBC 3.63 (L) 3.87 - 5.11 MIL/uL  ? Hemoglobin 11.6 (L) 12.0 - 15.0  g/dL  ? HCT 34.2 (L) 36.0 - 46.0 %  ? MCV 94.2 80.0 - 100.0 fL  ? MCH 32.0 26.0 - 34.0 pg  ? MCHC 33.9 30.0 - 36.0 g/dL  ? RDW 12.5 11.5 - 15.5 %  ? Platelets 269 150 - 400 K/uL  ? nRBC 0.0 0.0 - 0.2 %  ?  Comment: Performed at [facility], [address] [place], [place] [address]  ?Basic metabolic panel     Status: Abnormal  ? Collection Time: 09/20/21  6:06 AM  ?Result Value Ref Range  ? Sodium 134 (L) 135 - 145 mmol/L  ? Potassium 3.8 3.5 - 5.1 mmol/L  ? Chloride 102 98 - 111 mmol/L  ? CO2 26 22 - 32 mmol/L  ? Glucose, Bld 134 (H) 70 - 99 mg/dL  ?  Comment: Glucose reference range applies only to samples taken after fasting for at least 8 hours.  ? BUN 15 8 - 23 mg/dL  ? Creatinine, Ser 0.74 0.44 - 1.00 mg/dL  ? Calcium 8.2 (L) 8.9 - 10.3 mg/dL  ? GFR, Estimated >60 >60 mL/min  ?  Comment: (NOTE) ?Calculated using the CKD-EPI Creatinine Equation (2021) ?  ? Anion gap 6 5 - 15  ?  Comment: Performed at [facility], [address] [place], [place] [address]  ?Glucose, capillary     Status: Abnormal  ? Collection Time: 09/20/21  8:19 AM  ?Result Value Ref Range  ? Glucose-Capillary 118 (H) 70 - 99 mg/dL  ?  Comment: Glucose reference range applies only to samples taken after fasting for at least 8 hours.  ?Glucose, capillary     Status: Abnormal  ? Collection Time: 09/20/21 11:42 AM  ?Result Value Ref Range  ? Glucose-Capillary 122 (H) 70 - 99 mg/dL  ?  Comment: Glucose reference range applies only to samples taken after fasting for at least 8 hours.  ?Glucose, capillary     Status: Abnormal  ? Collection Time: 09/20/21  4:51 PM  ?Result Value Ref Range  ?  Glucose-Capillary 107 (H) 70 - 99 mg/dL  ?  Comment: Glucose reference range applies only to samples taken after fasting for at least 8 hours.  ?CBC     Status: Abnormal  ? Collection Time: 09/21/21  5:03 AM  ?Result Valu

## 2021-09-21 NOTE — Progress Notes (Signed)
Inpatient Rehabilitation Admissions Coordinator  ? ?I met at bedside with patient and her daughter , [name] for rehab assessment. Patient alert and interactive, eating lunch. We discussed goals and expectations of a possible Cir admit. They would like to discuss and see how she progresses over the next 24 hrs before deciding rehab venue for she has greatly improved over the last 24 hrs. ? ?Danne Baxter, RN, MSN ?Rehab Admissions Coordinator ?[phone] ?09/21/2021 2:15 PM ? ?

## 2021-09-21 NOTE — Assessment & Plan Note (Signed)
Trending down.  Procalcitonin <0.10,  ?Continue Rocephin, acyclovir as per infectious disease. ?

## 2021-09-21 NOTE — Assessment & Plan Note (Signed)
Patient found to have a new onset A-fib.  Now back to sinus rhythm.   ?Heart rate well controlled.  Continue Coreg and Eliquis. ?

## 2021-09-21 NOTE — Assessment & Plan Note (Addendum)
Patient presented for the evaluation of altered mentation. ?Initial impression was could be UTI or metabolic encephalopathy ?She was started on emperic antibiotics, continued to remain with altered mentation. 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 ?Son denies any history of dementia. ?S/p LP 09/19/21, CSF negative for bacterial meningitis.  Antibiotics discontinued. ?Patient is much improved now, sitting up in the chair,  answers questions appropriately. ?Continue with supportive care. ?

## 2021-09-22 DIAGNOSIS — G9341 Metabolic encephalopathy: Secondary | ICD-10-CM | POA: Diagnosis not present

## 2021-09-22 LAB — BASIC METABOLIC PANEL
Anion gap: 10 (ref 5–15)
BUN: 12 mg/dL (ref 8–23)
CO2: 27 mmol/L (ref 22–32)
Calcium: 9 mg/dL (ref 8.9–10.3)
Chloride: 97 mmol/L — ABNORMAL LOW (ref 98–111)
Creatinine, Ser: 0.68 mg/dL (ref 0.44–1.00)
GFR, Estimated: >60 mL/min (ref >60)
Glucose, Bld: 119 mg/dL — ABNORMAL HIGH (ref 70–99)
Potassium: 4.2 mmol/L (ref 3.5–5.1)
Sodium: 134 mmol/L — ABNORMAL LOW (ref 135–145)

## 2021-09-22 LAB — CBC
HCT: 39.8 % (ref 36.0–46.0)
Hemoglobin: 13.4 g/dL (ref 12.0–15.0)
MCH: 31.8 pg (ref 26.0–34.0)
MCHC: 33.7 g/dL (ref 30.0–36.0)
MCV: 94.3 fL (ref 80.0–100.0)
Platelets: 340 10*3/uL (ref 150–400)
RBC: 4.22 MIL/uL (ref 3.87–5.11)
RDW: 12.7 % (ref 11.5–15.5)
WBC: 7.8 10*3/uL (ref 4.0–10.5)
nRBC: 0 % (ref 0.0–0.2)

## 2021-09-22 LAB — ACID FAST SMEAR (AFB, MYCOBACTERIA): Acid Fast Smear: NEGATIVE

## 2021-09-22 LAB — GLUCOSE, CAPILLARY
Glucose-Capillary: 101 mg/dL — ABNORMAL HIGH (ref 70–99)
Glucose-Capillary: 105 mg/dL — ABNORMAL HIGH (ref 70–99)
Glucose-Capillary: 106 mg/dL — ABNORMAL HIGH (ref 70–99)
Glucose-Capillary: 121 mg/dL — ABNORMAL HIGH (ref 70–99)
Glucose-Capillary: 125 mg/dL — ABNORMAL HIGH (ref 70–99)
Glucose-Capillary: 92 mg/dL (ref 70–99)

## 2021-09-22 LAB — PHOSPHORUS: Phosphorus: 3.8 mg/dL (ref 2.5–4.6)

## 2021-09-22 LAB — HSV 1/2 PCR, CSF
HSV-1 DNA: NEGATIVE
HSV-2 DNA: NEGATIVE

## 2021-09-22 LAB — MAGNESIUM: Magnesium: 1.8 mg/dL (ref 1.7–2.4)

## 2021-09-22 NOTE — Progress Notes (Signed)
Occupational Therapy Treatment ?Patient Details ?Name: Joy Clark ?MRN: [id number] ?DOB: 04/28/1951 ?Today's Date: 09/22/2021 ? ? ?History of present illness Joy Clark is a 71 y.o. female with medical history significant for nicotine dependence, chronic pain syndrome, dyslipidemia who was brought into the ER by EMS for evaluation of mental status changes for about 4 days.  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  S/p lumbar puncture 09/19/21. ?  ?OT comments ? Joy Clark was seen for OT treatment on this date. Upon arrival to room pt reclined in chair, unaware sitting in wet gown/underwear. Pt requires MOD A don/doff briefs sit<>stand. MIN A don/doff gown in sitting. MIN A + RW sit<>stand and ~10 ft mobility - assist for RW mgmt and LOBs. Pt demonstrates poor insight into deficits, pt releases walker to reach for side table causing LOB staing "I need to bring that over there." When Joy Clark enters room and provides encouragement for walking pt states "you've seen me walk this week," which Joy Clark denies. Pt making progress toward goals however continues to require assist for balance and significant cueing. Pt continues to benefit from skilled OT services to maximize return to PLOF and minimize risk of future falls, injury, caregiver burden, and readmission. Will continue to follow POC. Discharge recommendation to AIR remains appropriate.  ?  ? ?Recommendations for follow up therapy are one component of a multi-disciplinary discharge planning process, led by the attending physician.  Recommendations may be updated based on patient status, additional functional criteria and insurance authorization. ?   ?Follow Up Recommendations ? Acute inpatient rehab (3hours/day)  ?  ?Assistance Recommended at Discharge Frequent or constant Supervision/Assistance  ?Patient can return home with the following ? Two people  to help with bathing/dressing/bathroom;Help with stairs or ramp for entrance;Direct supervision/assist for medications management;A lot of help with walking and/or transfers ?  ?Equipment Recommendations ? BSC/3in1  ?  ?Recommendations for Other Services   ? ?  ?Precautions / Restrictions Precautions ?Precautions: Fall ?Precaution Comments: Aspiration ?Restrictions ?Weight Bearing Restrictions: No  ? ? ?  ? ?Mobility Bed Mobility ?Overal bed mobility: Needs Assistance ?Bed Mobility: Supine to Sit, Sit to Supine ?  ?  ?Supine to sit: Min assist ?Sit to supine: Min assist ?  ?  ?  ? ?Transfers ?Overall transfer level: Needs assistance ?Equipment used: Rolling walker (2 wheels) ?Transfers: Sit to/from Stand ?Sit to Stand: Min assist ?  ?  ?  ?  ?  ?  ?  ?  ?Balance Overall balance assessment: Needs assistance ?Sitting-balance support: Bilateral upper extremity supported, Feet supported ?Sitting balance-[name]: Fair ?  ?  ?Standing balance support: No upper extremity supported, During functional activity ?Standing balance-[name]: Poor ?  ?  ?  ?  ?  ?  ?  ?  ?  ?  ?  ?  ?   ? ?ADL either performed or assessed with clinical judgement  ? ?ADL Overall ADL's : Needs assistance/impaired ?  ?  ?  ?  ?  ?  ?  ?  ?  ?  ?  ?  ?  ?  ?  ?  ?  ?  ?  ?General ADL Comments: MOD A don/doff briefs sit<>stand at EOB. MIN A don/doff gown in sitting. MIN A + RW for ADL t/f, assist for RW mgmt and LOBs ?  ? ? ? ?Cognition Arousal/Alertness: Awake/alert ?Behavior During Therapy: Flat  affect ?Overall Cognitive Status: Impaired/Different from baseline ?Area of Impairment: Orientation, Attention, Memory, Following commands, Safety/judgement, Problem solving ?  ?  ?  ?  ?  ?  ?  ?  ?Orientation Level: Disoriented to, Time, Situation ?  ?Memory: Decreased recall of precautions, Decreased short-term memory ?Following Commands: Follows one step commands consistently ?Safety/Judgement: Decreased awareness of safety, Decreased awareness of  deficits ?  ?Problem Solving: Slow processing, Decreased initiation ?General Comments: Poor insight into deficits, pt releases walker to reach for side table causing LOB staing "I need tobring that over there." When Joy Clark enters room and provides encouragement for walking pt states "you've seen me walk this week," which Joy Clark denies. ?  ?  ?   ?   ?   ?   ? ? ?Pertinent Vitals/ Pain       Pain Assessment ?Pain Assessment: No/denies pain ? ? ?Frequency ? Min 3X/week  ? ? ? ? ?  ?Progress Toward Goals ? ?OT Goals(current goals can now be found in the care plan section) ? Progress towards OT goals: Progressing toward goals ? ?Acute Rehab OT Goals ?Patient Stated Goal: to go home ?OT Goal Formulation: With patient ?Time For Goal Achievement: 10/04/21 ?Potential to Achieve Goals: Fair ?ADL Goals ?Pt Will Perform Grooming: with set-up;with supervision;sitting ?Pt Will Perform Lower Body Dressing: with min assist;sitting/lateral leans ?Pt Will Transfer to Toilet: with min assist;ambulating;regular height toilet ?Pt Will Perform Toileting - Clothing Manipulation and hygiene: with supervision;sitting/lateral leans  ?Plan Discharge plan remains appropriate;Frequency remains appropriate   ? ?Co-evaluation ? ? ?   ?  ?PT goals addressed during session: Mobility/safety with mobility;Balance;Proper use of DME;Strengthening/ROM ?  ?  ? ?  ?AM-PAC OT "6 Clicks" Daily Activity     ?Outcome Measure ? ? Help from another person eating meals?: A Little ?Help from another person taking care of personal grooming?: A Little ?Help from another person toileting, which includes using toliet, bedpan, or urinal?: A Lot ?Help from another person bathing (including washing, rinsing, drying)?: A Lot ?Help from another person to put on and taking off regular upper body clothing?: A Little ?Help from another person to put on and taking off regular lower body clothing?: A Lot ?6 Click Score: 15 ? ?  ?End of Session Equipment Utilized During  Treatment: Rolling walker (2 wheels) ? ?OT Visit Diagnosis: Other abnormalities of gait and mobility (R26.89);Muscle weakness (generalized) (M62.81) ?  ?Activity Tolerance Patient tolerated treatment well ?  ?Patient Left in bed;with call bell/phone within reach;with bed alarm set;with family/visitor present ?  ?Nurse Communication Mobility status ?  ? ?   ? ?Time: 5852-7782 ?OT Time Calculation (min): 33 min ? ?Charges: OT General Charges ?$OT Visit: 1 Visit ?OT Treatments ?$Self Care/Home Management : 23-37 mins ? ?Kathie Dike, M.S. OTR/L  ?09/22/21, 2:51 PM  ?ascom [phone] ? ?

## 2021-09-22 NOTE — Progress Notes (Signed)
Inpatient Rehabilitation Admissions Coordinator  ? ?I have left a voicemail for her daughter, Fleet Contras, to contact me to discuss her rehab venue choice. I feel patient would benefit from a CIR admit. ? ?Ottie Glazier, RN, MSN ?Rehab Admissions Coordinator ?([phone] ?09/22/2021 3:54 PM ? ?

## 2021-09-22 NOTE — Progress Notes (Signed)
?Progress Note ? ? ?Patient: Joy Clark RFX:[id number] DOB: [birth year] DOA: 09/07/2021     15 ? ?DOS: the patient was seen and examined on 09/22/2021 ?  ?Brief hospital course: ?This 71 years old female with PMH significant for nicotine dependence, chronic pain syndrome, hyperlipidemia who was brought in the ED for the evaluation of altered mentation for last 4 days.  Patient is visiting her daughter in [place] and has been here for last 4 weeks.  Daughter reports patient had multiple episodes of emesis, diarrhea and poor p.o. intake.  In the ED she was found to have hypokalemia,  lactic acidosis,  elevated troponin and pyuria.  CT head was unremarkable.  CT abdomen and pelvis shows inflammatory or infectious enterocolitis.  Ultrasound abdomen shows borderline gallbladder hydrops and+ Murphy sign.  She was admitted for altered mentation. ?Hospital course complicated by A-fib with RVR, started on Cardizem drip,  Cardiology was consulted now she is hemodynamically stable.  Patient was found to have CVA on MRI,  so neurology was consulted. CVA workup completed, recommended outpatient follow up. ?Patient continued to have altered mentation and progressive leukocytosis.  LP was done to rule out meningitis / Encephalitis.  She was initially started on empiric vancomycin, Ampicillin,  Rocephin and acyclovir.  CSF is negative for bacterial meningitis.  Infectious diseases consulted recommended to continue ceftriaxone and acyclovir until cultures comes back. PT recommended SNF now recommending acute rehab. ? ?Assessment and Plan: ?* Acute metabolic encephalopathy ?Patient presented for the evaluation of altered mentation. ?Initial impression was could be UTI or metabolic encephalopathy ?She was started on emperic antibiotics, continued to remain with altered mentation. 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 ?Son denies any history of dementia. ?S/p LP 09/19/21, CSF negative for bacterial  meningitis. ?Patient is much improved now, sitting up in the chair,  answers questions appropriately. ?Continue with supportive care. ? ?CVA (cerebral vascular accident) [facility]) ?MRI shows small acute infarct in the left thalamocapsular region. ?S/p LP on 09/19/2021.  CSF negative for bacterial meningitis. ?Ampicillin, vancomycin has been discontinued as per ID. ?Continue IV Rocephin, Acyclovir until cultures comes back. ?EEG shows moderate severe diffuse encephalopathy but no evidence of seizures. 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 ?Stroke work completed.  Neurology recommended to continue Eliquis,  Agree with ID recommendation and follow-up outstanding labs. ? ?HTN (hypertension) ?Continue Coreg, lisinopril, Aldactone. ? ?Dysphagia ?Speech and swallow evaluation completed. ?Recommended dysphagia 3 diet. ? ?Atrial fibrillation (HCC) ?Patient found to have a new onset A-fib.  Now back to sinus rhythm.   ?Heart rate well controlled.  Continue Coreg and Eliquis. ? ?Hyperthyroidism ?Thyroid ultrasound did not show suspicious nodules. ?TSH low, T4 elevated,  T3 normal. ?Continue Coreg, methimazole. ?Outpatient follow-up with endocrinology. ? ?Gallbladder hydrops ?General surgery was consulted.  No urgent surgical intervention needed. ? ?Hypokalemia ?Replaced.  Continue to monitor ? ?UTI (urinary tract infection) ?Completed course of antibiotics. ? ?Enterocolitis ?Likely viral versus inflammatory. ?C. difficile, GI PCR negative. ? ? ?Leucocytosis-resolved as of 09/21/2021 ?Trending down.  Procalcitonin <0.10,  ?Continue Rocephin, acyclovir as per infectious disease. ? ? ?Nutrition Status: ?Nutrition Problem: Moderate Malnutrition ?Etiology: acute illness (acute metabolic encepahlopathy) ?Signs/Symptoms: energy intake < or equal to 50% for > or equal to 5 days, mild fat depletion, moderate fat depletion, mild muscle depletion, moderate  muscle depletion ?Interventions: Ensure Enlive (each supplement provides 350kcal and 20 grams of protein),  MVI ?  ?Subjective: Patient was seen and examined at bedside.  Overnight events noted. ?Patient reports feeling much improved.  She was sitting on the bed having breakfast. ?She is fully aware about person, place, time.  She is able to move all extremities. ? ?Physical Exam: ?Vitals:  ? 09/21/21 2315 09/22/21 0414 09/22/21 0446 09/22/21 0835  ?BP: (!) 109/46 121/60  (!) 102/53  ?Pulse: 70 61  81  ?Resp: 16 14  17   ?Temp: (!) 97.4 ?F (36.3 ?C) 97.6 ?F (36.4 ?C)  97.9 ?F (36.6 ?C)  ?TempSrc:  Axillary    ?SpO2: 97% 98%  96%  ?Weight:   50 kg   ?Height:      ? ?General exam: Appears comfortable, not in any acute distress.  Deconditioned. ?Respiratory : CTA bilaterally, No wheezing, No crackles, normal respiratory effort. ?Cardiovascular : S1-S2 heard, regular rate and rhythm, no murmur. ?Gastrointestinal system: Abdomen is soft, non tender, non distended, BS+ ?Central nervous system: Alert, oriented x 3, no focal neurological deficits. ?Extremities: No edema, no cyanosis, no clubbing, ?Psychiatry: Mood, insight, judgment appropriate ? ? ? ?Data Reviewed: ?I have Reviewed nursing notes, Vitals, and Lab results since pt's last encounter. Pertinent lab results CBC, CMP,  ?I have ordered test including CBC, BMP ?I have reviewed the last note from neurology, infectious diseases,  ?I have discussed pt's care plan and test results with patient.  ? ?Family Communication: No family at bedside ? ?Disposition: ?Status is: Inpatient ?Remains inpatient appropriate because: Admitted for altered mentation,  initial impression was UTI  and found to have CVA, status post LP, negative for bacterial meningitis, remains on IV acyclovir and Rocephin until cultures comes back.  Neurology and infectious disease signed off.  PT recommended acute rehab. ? ? ?Planned Discharge Destination: Rehab ? ? ? ? ?Time spent: 35  minutes ? ?Author: ? , MD ?09/22/2021 10:43 AM ? ?For on call review [email or web address].  ?

## 2021-09-22 NOTE — Progress Notes (Signed)
Physical Therapy Treatment ?Patient Details ?Name: Joy Clark ?MRN: [id number] ?DOB: 05/05/1951 ?Today's Date: 09/22/2021 ? ? ?History of Present Illness Joy Clark is a 71 y.o. female with medical history significant for nicotine dependence, chronic pain syndrome, dyslipidemia who was brought into the ER by EMS for evaluation of mental status changes for about 4 days.  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  S/p lumbar puncture 09/19/21. ? ?  ?PT Comments  ? ? Pt was long sitting in bed awake however disoriented. Only oriented to self. She is mostly non verbal throughout session but occasionally did respond with short answers to simple questions. She agrees to OOB activity with encouragement. Increased time required to process all desired task requested of her. She was able to exit R side of bed, stand, and ambulate with RW ~ 25 ft. She requires constant assistance with RW progression however all vitals were stable throughout. She will require extensive PT going forward to maximize independence with all ADLs. CIR still most appropriate DC disposition currently. Acute PT will continue to follow and progress as able per current POC.  ?  ?Recommendations for follow up therapy are one component of a multi-disciplinary discharge planning process, led by the attending physician.  Recommendations may be updated based on patient status, additional functional criteria and insurance authorization. ? ?Follow Up Recommendations ? Acute inpatient rehab (3hours/day) ?  ?  ?Assistance Recommended at Discharge Frequent or constant Supervision/Assistance  ?Patient can return home with the following A lot of help with walking and/or transfers;A lot of help with bathing/dressing/bathroom;Assistance with cooking/housework;Assistance with feeding;Direct supervision/assist for medications management;Direct supervision/assist  for financial management;Assist for transportation;Help with stairs or ramp for entrance ?  ?Equipment Recommendations ? BSC/3in1;Wheelchair (measurements PT);Wheelchair cushion (measurements PT);Hospital bed  ?  ?Recommendations for Other Services   ? ? ?  ?Precautions / Restrictions Precautions ?Precautions: Fall ?Precaution Comments: Aspiration ?Restrictions ?Weight Bearing Restrictions: No  ?  ? ?Mobility ? Bed Mobility ?Overal bed mobility: Needs Assistance ?Bed Mobility: Supine to Sit, Sit to Supine ?  ?  ?Supine to sit: Mod assist ?Sit to supine:  (in recl;iner post session) ?  ?General bed mobility comments: Pt required increased time to perform with vcs for technique improvements. ?  ? ?Transfers ?Overall transfer level: Needs assistance ?Equipment used: Rolling walker (2 wheels) ?Transfers: Sit to/from Stand ?Sit to Stand: Min assist, Mod assist, From elevated surface ?  ?  ?  ?  ?  ?General transfer comment: pt struggles with initiation of movements however once she starts to perform transfers only required min-mod assist of one ?  ? ?Ambulation/Gait ?Ambulation/Gait assistance: Min assist ?Gait Distance (Feet): 25 Feet ?Assistive device: Rolling walker (2 wheels) ?Gait Pattern/deviations: Step-to pattern, Decreased step length - right, Decreased step length - left ?Gait velocity: decreased ?  ?  ?General Gait Details: Pt was able to ambulate to doorway of rioom and back to recliner. Vcs throughout + min assist to progress RW fwd. She did have 2 occasions of LOB but overall tolerated well. RR/HR/O2 all stable ? ? ? ?  ?Balance Overall balance assessment: Needs assistance ?Sitting-balance support: Bilateral upper extremity supported, Feet supported ?Sitting balance-[name]: Fair ?  ?  ?Standing balance support: During functional activity, Bilateral upper extremity supported ?Standing balance-[name]: Poor ?Standing balance comment: reliant on AD with 2 occasions of LOB during short gait distance ?   ?   ?  Cognition Arousal/Alertness: Awake/alert ?Behavior During Therapy: Flat affect ?Overall Cognitive Status: Impaired/Different from baseline ?Area of Impairment: Orientation, Attention, Memory, Following commands, Safety/judgement, Problem solving ?  ?   ?Orientation Level: Disoriented to, Place, Time, Situation ?Current Attention Level: Sustained ?Memory: Decreased recall of precautions, Decreased short-term memory ?Following Commands: Follows one step commands consistently ?Safety/Judgement: Decreased awareness of safety, Decreased awareness of deficits ?  ?Problem Solving: Slow processing, Decreased initiation ?General Comments: Pt is alert throughout session however was non verbal. She was able to consistently follow commands with increased time to process and tactle/verbalcueing ?  ?  ? ?  ?   ?   ? ?Pertinent Vitals/Pain Pain Assessment ?Pain Assessment: No/denies pain ?Pain Score: 0-No pain  ? ? ? ?PT Goals (current goals can now be found in the care plan section) Acute Rehab PT Goals ?Patient Stated Goal: none stated ?Progress towards PT goals: Progressing toward goals ? ?  ?Frequency ? ? ? 7X/week ? ? ? ?  ?PT Plan Frequency needs to be updated  ? ? ?Co-evaluation   ?  ?PT goals addressed during session: Mobility/safety with mobility;Balance;Proper use of DME;Strengthening/ROM ?  ?  ? ?  ?AM-PAC PT "6 Clicks" Mobility   ?Outcome Measure ? Help needed turning from your back to your side while in a flat bed without using bedrails?: A Little ?Help needed moving from lying on your back to sitting on the side of a flat bed without using bedrails?: A Little ?Help needed moving to and from a bed to a chair (including a wheelchair)?: A Lot ?Help needed standing up from a chair using your arms (e.g., wheelchair or bedside chair)?: A Lot ?Help needed to walk in hospital room?: A Lot ?Help needed climbing 3-5 steps with a railing? : A Lot ?6 Click Score: 14 ? ?  ?End of Session Equipment Utilized During Treatment:  Gait belt ?Activity Tolerance: Patient tolerated treatment well ?Patient left: in chair;with call bell/phone within reach;with chair alarm set ?Nurse Communication: Mobility status ?PT Visit Diagnosis: Muscle weakness (generalized) (M62.81);Other abnormalities of gait and mobility (R26.89);Unsteadiness on feet (R26.81) ?  ? ? ?Time: 8185-6314 ?PT Time Calculation (min) (ACUTE ONLY): 29 min ? ?Charges:  $Gait Training: 8-22 mins ?$Therapeutic Activity: 8-22 mins          ?          ? ?Jetta Lout PTA ?09/22/21, 11:25 AM  ? ?

## 2021-09-22 NOTE — Progress Notes (Signed)
Nutrition Follow-up ? ?DOCUMENTATION CODES:  ? ?Non-severe (moderate) malnutrition in context of acute illness/injury ? ?INTERVENTION:  ? ?-Continue Ensure Enlive po TID, each supplement provides 350 kcal and 20 grams of protein ?-Continue MVI with minerals daily  ? ?NUTRITION DIAGNOSIS:  ? ?Moderate Malnutrition related to acute illness (acute metabolic encepahlopathy) as evidenced by energy intake < or equal to 50% for > or equal to 5 days, mild fat depletion, moderate fat depletion, mild muscle depletion, moderate muscle depletion. ? ?Ongoing ? ?GOAL:  ? ?Patient will meet greater than or equal to 90% of their needs ? ?Progressing  ? ?MONITOR:  ? ?PO intake, Supplement acceptance, Diet advancement, Labs, Weight trends, Skin, I & O's ? ?REASON FOR ASSESSMENT:  ? ?Low [place], Consult ?Enteral/tube feeding initiation and management ? ?ASSESSMENT:  ? ?Joy Clark is a 71 y.o. female with medical history significant for nicotine dependence, chronic pain syndrome, dyslipidemia who was brought into the ER by EMS for evaluation of mental status changes for about 4 days.  Patient is visiting her daughter here in [place] and has been here for about 4 weeks.  At baseline she is usually awake, alert and oriented to person place and time.  Patient's daughter states that her symptoms started about 4 days ago with chills and confusion.  Since then she has had multiple episodes of emesis and diarrhea and is now incontinent of stool which is loose and has lots of mucus in it.  Patient's oral intake has been very poor over the last 4 days as well.  She is awake and oriented to person but is unable to follow commands. ? ?3/2- s/p BSE- advanced to full liquid diet ?3/3- s/p BSE- advanced to regular diet ?3/5- NGT placed (placement confirmed in stomach) ?3/6- TF initiated ?3/14- NGT pulled out by pt ? ?Reviewed I/O's: -1.1 L x 24 hours and +12.2 L since 09/08/21 ? ?UOP: 2.4 L x 24 hours ? ?Pt sitting up in recliner chair, eating  breakfast at time of visit. Pt reports feeling better and swallowing has improved. Noted pt consumed about 25-100% of her breakfast and 75% of her Ensure supplement. Meal completions 25-100%. Pt reports Ensure supplements are "okay" and agrees to continue drinking them. RD discussed importance of good meal and supplement intake to promote healing.   ? ?Per CIR notes, possible plan for CIR admit, but would like to see how pt progresses over the next 24 hours.  ? ?Medications reviewed and include aldactone.  ? ?Labs reviewed: Na: 134, CBGS: 105-133 (inpatient orders for glycemic control are none).   ? ?Diet Order:   ?Diet Order   ? ?       ?  DIET DYS 3 Room service appropriate? Yes; Fluid consistency: Thin  Diet effective now       ?  ? ?  ?  ? ?  ? ? ?EDUCATION NEEDS:  ? ?Education needs have been addressed ? ?Skin:  Skin Assessment: Skin Integrity Issues: ?Skin Integrity Issues:: Other (Comment) ?Other: MASD bilateral groin ? ?Last BM:  09/20/21 ? ?Height:  ? ?Ht Readings from Last 1 Encounters:  ?09/07/21 5\' 10"  (1.778 m)  ? ? ?Weight:  ? ?Wt Readings from Last 1 Encounters:  ?09/22/21 50 kg  ? ? ?Ideal Body Weight:  68.2 kg ? ?BMI:  Body mass index is 15.82 kg/m?. ? ?Estimated Nutritional Needs:  ? ?Kcal:  1900-2100 ? ?Protein:  95-110 grams ? ?Fluid:  > 1.9 L ? ? ? ?Eyleen Rawlinson  W, RD, LDN, CDCES ?Registered Dietitian II ?Certified Diabetes Care and Education Specialist ?Please refer to [facility] for RD and/or RD on-call/weekend/after hours pager  ?

## 2021-09-22 NOTE — Plan of Care (Signed)
Neurology plan of care ? ?CTA H&N ? ?1. 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 ?2. 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 ?3. 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 ?4. Aortic Atherosclerosis (ICD10-I70.0) and Emphysema (ICD10-J43.9). ? ?CNS imaging personally reviewed; I agree with above interpretation ? ?See most recent neuro progress note for history and hospital course to date, summary of workup, and full recommendations.  ? ?Final recommendations: ?Stroke workup complete.  ?Continue eliquis.  ?Agree with ID recs re: discontinuation of abx and continuation of acyclovir until CSF HSV results negative.  ?Primary team please f/u on outstanding [facility] send out labs (misc labs ordered 3/10 - anti-Gq1b Abs and Lymphocytic choriomeningitis virus IgG and IgM). These are expected to be negative. If patient discharged before results are available, may be followed up by PCP. ?I will arrange neuro f/u outpatient ? ?Neurology to sign off, but please re-engage if additional neurologic questions arise. ? ?Bing Neighbors, MD ?[facility] ?[phone] ? ?If 7pm- 7am, please page neurology on call as listed in AMION. ? ?

## 2021-09-23 DIAGNOSIS — G9341 Metabolic encephalopathy: Secondary | ICD-10-CM | POA: Diagnosis not present

## 2021-09-23 LAB — CSF CULTURE W GRAM STAIN: Culture: NO GROWTH

## 2021-09-23 LAB — GLUCOSE, CAPILLARY: Glucose-Capillary: 113 mg/dL — ABNORMAL HIGH (ref 70–99)

## 2021-09-23 MED ORDER — PANTOPRAZOLE SODIUM 40 MG PO TBEC
40.0000 mg | DELAYED_RELEASE_TABLET | Freq: Every day | ORAL | Status: DC
  Administered 2021-09-23 – 2021-09-24 (×2): 40 mg via ORAL
  Filled 2021-09-23 (×2): qty 1

## 2021-09-23 NOTE — Progress Notes (Addendum)
Inpatient Rehabilitation Admissions Coordinator  ? ?I spoke with her daughter, Fleet Contras , by phone to clarify preference for rehab venue. She is discussing with her siblings and will let me know by 1030 this morning if CIR at [facility] in [place] vs home with [facility] decision this morning. Acute team and TOC made aware. ? ?Ottie Glazier, RN, MSN ?Rehab Admissions Coordinator ?[phone] ?09/23/2021 10:13 AM ? ?I received call from daughter, Fleet Contras. Patient and family wish to pursue discharge home locally with Fleet Contras and for daughter from [place] to come in next few days to escort patient home to [place]. I have alerted acute team and TOC. We will sign off at this time. ? ?Ottie Glazier, RN, MSN ?Rehab Admissions Coordinator ?[phone] ?09/23/2021 10:54 AM ? ? ?

## 2021-09-23 NOTE — Progress Notes (Signed)
?Progress Note ? ? ?Patient: Joy Clark [id number] DOB: [birth year] DOA: 09/07/2021     16 ? ?DOS: the patient was seen and examined on 09/23/2021 ?  ?Brief hospital course: ?This 71 years old female with PMH significant for nicotine dependence, chronic pain syndrome, hyperlipidemia who was brought in the ED for the evaluation of altered mentation for last 4 days.  Patient is visiting her daughter in [place] and has been here for last 4 weeks.  Daughter reports patient had multiple episodes of emesis, diarrhea and poor p.o. intake.  In the ED she was found to have hypokalemia,  lactic acidosis,  elevated troponin and pyuria.  CT head was unremarkable.  CT abdomen and pelvis shows inflammatory or infectious enterocolitis.  Ultrasound abdomen shows borderline gallbladder hydrops and+ Murphy sign.  She was admitted for altered mentation. ?Hospital course complicated by A-fib with RVR, started on Cardizem drip,  Cardiology was consulted now she is hemodynamically stable.  Patient was found to have CVA on MRI,  so neurology was consulted. CVA workup completed, recommended outpatient follow up. ?Patient continued to have altered mentation and progressive leukocytosis.  LP was done to rule out meningitis / Encephalitis.  She was initially started on empiric vancomycin, Ampicillin,  Rocephin and acyclovir.  CSF is negative for bacterial meningitis.  Infectious diseases consulted recommended to continue ceftriaxone and acyclovir until cultures comes back. PT recommended SNF now recommending acute rehab. ? ?Assessment and Plan: ?* Acute metabolic encephalopathy ?Patient presented for the evaluation of altered mentation. ?Initial impression was could be UTI or metabolic encephalopathy ?She was started on emperic antibiotics, continued to remain with altered mentation. 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 ?Son denies any history of dementia. ?S/p LP 09/19/21, CSF negative for bacterial  meningitis. ?Patient is much improved now, sitting up in the chair,  answers questions appropriately. ?Continue with supportive care. ? 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 ?S/p LP on 09/19/2021.  CSF negative for bacterial meningitis. ?Ampicillin, vancomycin has been discontinued as per ID. ?Rocephin discontinued.  Acyclovir discontinued due to HCV cultures negative ?EEG shows moderate severe diffuse encephalopathy but no evidence of seizures. 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 ?Stroke work completed.  Neurology recommended to continue Eliquis, Outpatient neurology follow-up ?PCP follow-up on pending lab work. ? ?HTN (hypertension) ?Continue Coreg, lisinopril, Aldactone. ? ?Dysphagia ?Speech and swallow evaluation completed. ?Recommended dysphagia 3 diet. ? ?Atrial fibrillation ([place]) ?Patient found to have a new onset A-fib.  Now back to sinus rhythm.   ?Heart rate well controlled.  Continue Coreg and Eliquis. ? ?Hyperthyroidism ?Thyroid ultrasound did not show suspicious nodules. ?TSH low, T4 elevated,  T3 normal. ?Continue Coreg, methimazole. ?Outpatient follow-up with endocrinology. ? ?Gallbladder hydrops ?General surgery was consulted.  No urgent surgical intervention needed. ? ?Hypokalemia ?Replaced.  Continue to monitor ? ?UTI (urinary tract infection) ?Completed course of antibiotics. ? ?Enterocolitis ?Likely viral versus inflammatory. ?C. difficile, GI PCR negative. ? ? ?Leucocytosis-resolved as of 09/21/2021 ?Trending down.  Procalcitonin <0.10,  ?Continue Rocephin, acyclovir as per infectious disease. ? ? ?Nutrition Status: ?Nutrition Problem: Moderate Malnutrition ?Etiology: acute illness (acute metabolic encepahlopathy) ?Signs/Symptoms: energy intake < or equal to 50% for > or equal to 5 days, mild fat depletion, moderate fat depletion, mild muscle  depletion, moderate muscle depletion ?Interventions: Ensure Enlive (each supplement provides 350kcal and 20 grams  of protein), MVI ?  ?Subjective: Patient was seen and examined at bedside.  Overnight events noted. ?Patient reports feeling much improved.  She was sitting on the bed having breakfast. ?She is fully aware about person, place, time.  She is able to move all extremities. ? ?Physical Exam: ?Vitals:  ? 09/23/21 0350 09/23/21 0500 09/23/21 0901 09/23/21 1224  ?BP: (!) 117/54  92/82 (!) 102/53  ?Pulse: 66  77 69  ?Resp: 20  17 16   ?Temp: 98 ?F (36.7 ?C)  98 ?F (36.7 ?C) 98 ?F (36.7 ?C)  ?TempSrc:      ?SpO2: 100%  97% 98%  ?Weight:  49 kg    ?Height:      ? ?General exam: Appears comfortable, not in any acute distress.  Deconditioned ?Respiratory : CTA bilaterally, No wheezing, No crackles, normal respiratory effort. ?Cardiovascular : S1-S2 heard, regular rate and rhythm, no murmur. ?Gastrointestinal system: Abdomen is soft, non tender, non distended, BS+ ?Central nervous system: Alert, oriented x 3, no focal neurological deficits. ?Extremities: No edema, no cyanosis, no clubbing, ?Psychiatry: Mood, insight, judgment appropriate ? ? ?Data Reviewed: ?I have Reviewed nursing notes, Vitals, and Lab results since pt's last encounter. Pertinent lab results CBC, BMP ?I have ordered test including CBC, BMP ?I have reviewed the last note from neurology, infectious diseases,  ?I have discussed pt's care plan and test results with patient.  ? ?Family Communication: No family at bedside ? ?Disposition: ?Status is: Inpatient ?Remains inpatient appropriate because: Admitted for altered mentation,  initial impression was UTI  and found to have CVA, status post LP, negative for bacterial meningitis, HSV cultures negative,  acyclovir discontinued.  Neurology and infectious disease signed off.  PT recommended acute rehab. ? ? ?Planned Discharge Destination:.  Home with home PT ? ? ? ? ?Time spent: 35  minutes ? ?Author: ?Shawna Clamp, MD ?09/23/2021 2:13 PM ? ?For on call review [email or web address].  ?

## 2021-09-23 NOTE — Progress Notes (Signed)
OT Cancellation Note ? ?Patient Details ?Name: Joy Clark ?MRN: [id number] ?DOB: 09/25/1950 ? ? ?Cancelled Treatment:    Reason Eval/Treat Not Completed: Fatigue/lethargy limiting ability to participate. Attempted x3 this date, no family in the room for caregiver training as family has refused CIR and will need education on transfer training. Upon 3rd attempt pt unaware of lunch at bedside, requesting to eat. Pt assisted in sitting at EOB (alarm exit on) and setup for meal. Will reattempt at later time/date as able.  ? ?Kathie Dike, M.S. OTR/L  ?09/23/21, 4:29 PM  ?ascom [phone] ? ?

## 2021-09-23 NOTE — TOC Progression Note (Addendum)
Transition of Care (TOC) - Progression Note  ? ? ?Patient Details  ?Name: Sharese Manrique ?MRN: [id number] ?Date of Birth: September 24, 1950 ? ?Transition of Care (TOC) CM/SW Contact  ?Margarito Liner, LCSW ?Phone Number: ?09/23/2021, 1:51 PM ? ?Clinical Narrative: Called daughter Fleet Contras. She is agreeable to setting up home health in [place]. Agency list in the room for her to review when she arrives. She will get PCP name. Will order RW and 3-in-1 to be delivered to patient's [facility] address. Daughter asked if she should have family book plane tickets to come to [facility]. Explained that MD wanted to speak with her before any plans were made.   ? ?2:07 pm: Ordered RW and 3-in-1 through [employer]. ? ?Expected Discharge Plan: Skilled Nursing Facility ?Barriers to Discharge: Continued Medical Work up ? ?Expected Discharge Plan and Services ?Expected Discharge Plan: Skilled Nursing Facility ?  ?  ?  ?  ?                ?  ?  ?  ?  ?  ?  ?  ?  ?  ?  ? ? ?Social Determinants of Health (SDOH) Interventions ?  ? ?Readmission Risk Interventions ?No flowsheet data found. ? ?

## 2021-09-23 NOTE — Care Management Important Message (Signed)
Important Message ? ?Patient Details  ?Name: Joy Clark ?MRN: [id number] ?Date of Birth: July 05, 1951 ? ? ?Medicare Important Message Given:  Yes ? ?Patient asleep upon time of visit, no family in room.  Copy of Medicare IM left on windowsill for reference.  ? ? ?[name] ?09/23/2021, 2:01 PM ?

## 2021-09-23 NOTE — Progress Notes (Signed)
Physical Therapy Treatment ?Patient Details ?Name: Joy Clark ?MRN: [id number] ?DOB: 1951/07/02 ?Today's Date: 09/23/2021 ? ? ?History of Present Illness Joy Clark is a 71 y.o. female with medical history significant for nicotine dependence, chronic pain syndrome, dyslipidemia who was brought into the ER by EMS for evaluation of mental status changes for about 4 days.  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  S/p lumbar puncture 09/19/21. ? ?  ?PT Comments  ? ? Pt was asleep in long sitting upon arriving. She easily awakes and is agreeable to OOB activity with encouragement. Pt was unaware breakfast tray had arrived. She is more verbal today than previous date however still presents with flat affect and does not volunteer any information or initiate conversation. She was able to consistently follow simple commands with increased time to process. Slow motor planning overall. She was able to exit bed, stand, and ambulate ~ 50 ft today. Much improved abilities to self propel RW. Pt continues to be high fall risk due to balance deficits and poor gait kinematics. Pt tends to have narrow BOS and even scissoring at times. She will greatly benefit from continued skilled PT at DC to address deficits while assisting her to PLOF. CIR still most appropriate DC disposition. Acute PT will continue to follow and progress as able per current POC. ?  ?Recommendations for follow up therapy are one component of a multi-disciplinary discharge planning process, led by the attending physician.  Recommendations may be updated based on patient status, additional functional criteria and insurance authorization. ? ?Follow Up Recommendations ? Acute inpatient rehab (3hours/day) ?  ?  ?Assistance Recommended at Discharge Frequent or constant Supervision/Assistance  ?Patient can return home with the following A lot of help with  walking and/or transfers;A lot of help with bathing/dressing/bathroom;Assistance with cooking/housework;Assistance with feeding;Direct supervision/assist for medications management;Direct supervision/assist for financial management;Assist for transportation;Help with stairs or ramp for entrance ?  ?Equipment Recommendations ? Rolling walker (2 wheels);BSC/3in1;Wheelchair cushion (measurements PT);Wheelchair (measurements PT);Other (comment) (ongoing assessment... defer to CIR level of care for equipment needs at DC)  ?  ?   ?Precautions / Restrictions Precautions ?Precautions: Fall ?Precaution Comments: Aspiration ?Restrictions ?Weight Bearing Restrictions: No  ?  ? ?Mobility ? Bed Mobility ?Overal bed mobility: Needs Assistance ?Bed Mobility: Supine to Sit ?  ?  ?Supine to sit: Min assist ?  ?  ?General bed mobility comments: Min assist to achieve EOB short sit form long sitting in bed. increased time to perform however slightly improved from previous date. ?  ? ?Transfers ?Overall transfer level: Needs assistance ?Equipment used: Rolling walker (2 wheels) ?Transfers: Sit to/from Stand ?Sit to Stand: Min assist ?  ?  ?  ?  ?  ?General transfer comment: pt contiues to require physical assistance to achieve full upright standing. She has slight posterior push but once standing is able to achieve neutral with vcs for widening BOS and fwd wt shift ?  ? ?Ambulation/Gait ?Ambulation/Gait assistance: Min assist ?Gait Distance (Feet): 50 Feet ?Assistive device: Rolling walker (2 wheels) ?Gait Pattern/deviations: Step-through pattern, Decreased stride length, Narrow base of support, Staggering left, Staggering right, Scissoring ?Gait velocity: decreased ?  ?  ?General Gait Details: Pt tolerated ambulation 50 ft with use of RW. improved abilities to self propel RW today. Continues to be high fall risk due to narrow BOS with occasional scissoring (especially with turns). Overall pt is progressing with gait safety  and  tolerance to ambulation. Viotals were stable throughout session. ? ?  ?Balance Overall balance assessment: Needs assistance ?Sitting-balance support: Bilateral upper extremity supported, Feet supported ?Sitting balance-[name]: Fair ?  ?  ?Standing balance support: Bilateral upper extremity supported, During functional activity, Reliant on assistive device for balance ?Standing balance-[name]: Poor ?Standing balance comment: pt continues to be high fall risk due to dynamic/static standing balance deficits ?  ?   ?Cognition Arousal/Alertness: Lethargic ?Behavior During Therapy: Flat affect ?Overall Cognitive Status: Impaired/Different from baseline ?Area of Impairment: Orientation, Attention, Memory, Following commands, Safety/judgement, Problem solving ?  ?   ?Orientation Level: Disoriented to, Time, Situation ?Current Attention Level: Sustained ?Memory: Decreased recall of precautions, Decreased short-term memory ?Following Commands: Follows one step commands consistently ?Safety/Judgement: Decreased awareness of safety, Decreased awareness of deficits ?  ?Problem Solving: Slow processing, Decreased initiation ?General Comments: Pt was awake but still lethargic this morning. She was more verbal throughout session however continues to prevent with lethargy. she is oriented to self and hospital only. Does however continue to follow simple one step commands with increased time to process. ?  ?  ? ?  ?   ?General Comments General comments (skin integrity, edema, etc.): Author assisted pt with breakfast tray set up. she needs assistance and encouragement to eat. Discussed CIR  at DC and pt seemed agreeable however author questions if pt really fully understands current situation and current overall deficits. ?  ?  ? ?Pertinent Vitals/Pain Pain Assessment ?Pain Assessment: No/denies pain ?Pain Score: 0-No pain  ? ? ? ?PT Goals (current goals can now be found in the care plan section) Acute Rehab PT  Goals ?Patient Stated Goal: none stated ?Progress towards PT goals: Progressing toward goals ? ?  ?Frequency ? ? ? 7X/week ? ? ? ?  ?PT Plan Current plan remains appropriate  ? ? ?Co-evaluation   ?  ?PT goals addressed during session: Mobility/safety with mobility;Balance;Proper use of DME;Strengthening/ROM ?  ?  ? ?  ?AM-PAC PT "6 Clicks" Mobility   ?Outcome Measure ? Help needed turning from your back to your side while in a flat bed without using bedrails?: A Little ?Help needed moving from lying on your back to sitting on the side of a flat bed without using bedrails?: A Little ?Help needed moving to and from a bed to a chair (including a wheelchair)?: A Lot ?Help needed standing up from a chair using your arms (e.g., wheelchair or bedside chair)?: A Lot ?Help needed to walk in hospital room?: A Lot ?Help needed climbing 3-5 steps with a railing? : A Lot ?6 Click Score: 14 ? ?  ?End of Session Equipment Utilized During Treatment: Gait belt ?Activity Tolerance: Patient tolerated treatment well ?Patient left: in chair;with call bell/phone within reach;with chair alarm set ?Nurse Communication: Mobility status ?PT Visit Diagnosis: Muscle weakness (generalized) (M62.81);Other abnormalities of gait and mobility (R26.89);Unsteadiness on feet (R26.81) ?  ? ? ?Time: [id number] ?PT Time Calculation (min) (ACUTE ONLY): 24 min ? ?Charges:  $Gait Training: 8-22 mins ?$Therapeutic Activity: 8-22 mins          ?          ?Julaine Fusi PTA ?09/23/21, 9:13 AM  ? ?

## 2021-09-24 DIAGNOSIS — G9341 Metabolic encephalopathy: Secondary | ICD-10-CM | POA: Diagnosis not present

## 2021-09-24 MED ORDER — OXYCODONE-ACETAMINOPHEN 10-325 MG PO TABS: 1.0000 | ORAL_TABLET | Freq: Four times a day (QID) | ORAL | 0 refills | Status: AC | PRN

## 2021-09-24 MED ORDER — APIXABAN 5 MG PO TABS: 5.0000 mg | ORAL_TABLET | Freq: Two times a day (BID) | ORAL | 3 refills | Status: AC

## 2021-09-24 MED ORDER — SPIRONOLACTONE 50 MG PO TABS
50.0000 mg | ORAL_TABLET | Freq: Every day | ORAL | 1 refills | Status: AC
Start: 2021-09-25 — End: ?

## 2021-09-24 MED ORDER — LISINOPRIL 40 MG PO TABS: 40.0000 mg | ORAL_TABLET | Freq: Every day | ORAL | 1 refills | Status: AC

## 2021-09-24 MED ORDER — METHIMAZOLE 5 MG PO TABS: 5.0000 mg | ORAL_TABLET | Freq: Every day | ORAL | 3 refills | Status: AC

## 2021-09-24 NOTE — Discharge Instructions (Signed)
Advised to follow-up with primary care physician in 1 week. ?Advised to follow-up with Neurology in 3 to 4 weeks. ?Advised to take Eliquis 5 mg twice daily. ?Advised to take lisinopril 40 mg daily, Aldactone 50 mg daily. ?

## 2021-09-24 NOTE — Plan of Care (Signed)

## 2021-09-24 NOTE — Discharge Summary (Signed)
?Physician Discharge Summary ?  ?Patient: Joy Clark MRN: [id number] DOB: [birth year]  ?Admit date:     09/07/2021  ?Discharge date: 09/24/21  ?Discharge Physician: Cipriano Bunker  ? ?PCP: Pcp, No  ? ?Recommendations at discharge:  ?Advised to follow-up with primary care physician in 1 week. ?Advised to follow-up with Neurology in 3 to 4 weeks. ?Advised to take Eliquis 5 mg twice daily. ?Advised to take lisinopril 40 mg daily, Aldactone 50 mg daily. ?Home health services been arranged. ? ?Discharge Diagnoses: ?Principal Problem: ?  Acute metabolic encephalopathy ?Active Problems: ?  CVA (cerebral vascular accident) (HCC) ?  Enterocolitis ?  UTI (urinary tract infection) ?  Hypokalemia ?  Abnormal CT scan, gallbladder ?  Altered mental status ?  Colitis ?  Dehydration ?  Gallbladder hydrops ?  Hyperthyroidism ?  Nausea vomiting and diarrhea ?  RUQ pain ?  Atrial fibrillation (HCC) ?  Hypernatremia ?  Aspiration into airway ?  Malnutrition of moderate degree ?  Dysphagia ?  Encephalitis ?  HTN (hypertension) ?  Encephalopathy ? ?Resolved Problems: ?  Leucocytosis ? ?Hospital Course: ?This 71 years old female with PMH significant for Nicotine dependence, chronic pain syndrome, hyperlipidemia who was brought in the ED for the evaluation of altered mentation for last 4 days. Patient is visiting her daughter in [place] and has been here for last 4 weeks. Daughter reports patient had multiple episodes of emesis, diarrhea and poor p.o. intake.  In the ED she was found to have hypokalemia,  lactic acidosis,  elevated troponin and pyuria.  CT head was unremarkable.  CT abdomen and pelvis shows inflammatory or infectious enterocolitis.  Ultrasound abdomen shows borderline gallbladder hydrops and+ Murphy sign.  She was admitted for altered mentation. ?Hospital course complicated by A-fib with RVR, started on Cardizem drip,  Cardiology was consulted, She remains hemodynamically stable. Patient was found to have CVA on MRI,   so Neurology was consulted. CVA workup completed, recommended outpatient Neurology follow up. Patient continued to have altered mentation and progressive leukocytosis.  LP was done to rule out meningitis / Encephalitis.  She was initially started on empiric antibiotics ( vancomycin, Ampicillin,  Rocephin and acyclovir).  CSF is negative for bacterial meningitis.  Infectious diseases consulted recommended to continue ceftriaxone and acyclovir until cultures comes back.  ?HSV culture came back negative.  Patient has significantly improved and back to her baseline mental status.  PT has recommended acute rehab.  Since patient is from [facility], family requesting discharge home with daughter in [place], and then she will fly to [facility] next week.  Follow-up with primary care physician in [place].  ? ?Assessment and Plan: ?* Acute metabolic encephalopathy ?Patient presented for the evaluation of altered mentation. ?Initial impression was could be UTI or metabolic encephalopathy ?She was started on emperic antibiotics, continued to remain with altered mentation. 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 ?Son denies any history of dementia. ?S/p LP 09/19/21, CSF negative for bacterial meningitis.  Antibiotics discontinued. ?Patient is much improved now, sitting up in the chair,  answers questions appropriately. ?Continue with supportive care. ? ?CVA (cerebral vascular accident) [facility]) ?MRI showed small acute infarct in the left thalamocapsular region. ?S/p LP on 09/19/2021.  CSF negative for bacterial meningitis. ?Ampicillin, vancomycin has been discontinued as per ID. ?Rocephin discontinued.  Acyclovir discontinued due to HSV cultures negative ?EEG shows moderate severe diffuse encephalopathy but no evidence of seizures. 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 ?Stroke work completed.  Neurology recommended to  continue Eliquis, Outpatient neurology follow-up ?PCP follow-up on pending lab work. ? ?HTN (hypertension) ?Continue Coreg, lisinopril, Aldactone. ? ?Dysphagia ?Speech and swallow evaluation completed. ?Recommended dysphagia 3 diet. ? ?Atrial fibrillation (HCC) ?Patient found to have a new onset A-fib.  Now back to sinus rhythm.   ?Heart rate well controlled.  Continue Coreg and Eliquis. ? ?Hyperthyroidism ?Thyroid ultrasound did not show suspicious nodules. ?TSH low, T4 elevated,  T3 normal. ?Continue Coreg, methimazole. ?Outpatient follow-up with endocrinology. ? ?Gallbladder hydrops ?General surgery was consulted.  No urgent surgical intervention needed. ? ?Hypokalemia ?Replaced.  Continue to monitor ? ?UTI (urinary tract infection) ?Completed course of antibiotics. ? ?Enterocolitis ?Likely viral versus inflammatory. ?C. difficile, GI PCR negative. ? ? ?Leucocytosis-resolved as of 09/21/2021 ?Trending down.  Procalcitonin <0.10,  ?Continue Rocephin, acyclovir as per infectious disease. ? ? ?Nutrition Status: ?Nutrition Problem: Moderate Malnutrition ?Etiology: acute illness (acute metabolic encepahlopathy) ?Signs/Symptoms: energy intake < or equal to 50% for > or equal to 5 days, mild fat depletion, moderate fat depletion, mild muscle depletion, moderate muscle depletion ?Interventions: Ensure Enlive (each supplement provides 350kcal and 20 grams of protein), MVI ?  ? ?Pain control - [employer] Controlled Substance Reporting System database was reviewed. and patient was instructed, not to drive, operate heavy machinery, perform activities at heights, swimming or participation in water activities or provide baby-sitting services while on Pain, Sleep and Anxiety Medications; until their outpatient Physician has advised to do so again. Also recommended to not to take more than prescribed Pain, Sleep and Anxiety Medications.  ?Consultants: Neurology, infectious diseases. ? ?Procedures performed: Lumbar  puncture ? ?Disposition: Home health ? ?Diet recommendation:  ?Discharge Diet Orders (From admission, onward)  ? ?  Start     Ordered  ? 09/24/21 0000  Diet - low sodium heart healthy       ? 09/24/21 1038  ? 09/24/21 0000  Diet Carb Modified       ? 09/24/21 1038  ? ?  ?  ? ?  ? ?Carb modified diet ?DISCHARGE MEDICATION: ?Allergies as of 09/24/2021   ?Not on File ?  ? ?  ?Medication List  ?  ? ?TAKE these medications   ? ?albuterol 108 (90 Base) MCG/ACT inhaler ?Commonly known as: VENTOLIN HFA ?Inhale into the lungs every 6 (six) hours as needed for wheezing or shortness of breath. ?  ?apixaban 5 MG Tabs tablet ?Commonly known as: ELIQUIS ?Take 1 tablet (5 mg total) by mouth 2 (two) times daily. ?  ?atorvastatin 40 MG tablet ?Commonly known as: LIPITOR ?Take 40 mg by mouth daily. ?  ?carboxymethylcellulose 1 % ophthalmic solution ?1 drop 3 (three) times daily. ?  ?celecoxib 200 MG capsule ?Commonly known as: CELEBREX ?Take 200 mg by mouth 2 (two) times daily. ?  ?cilostazol 100 MG tablet ?Commonly known as: PLETAL ?Take 100 mg by mouth 2 (two) times daily. ?  ?fluticasone 50 MCG/ACT nasal spray ?Commonly known as: FLONASE ?Place into both nostrils daily. ?  ?gabapentin 800 MG tablet ?Commonly known as: NEURONTIN ?Take 800 mg by mouth 3 (three) times daily. ?  ?lisinopril 40 MG tablet ?Commonly known as: ZESTRIL ?Take 1 tablet (40 mg total) by mouth daily. ?Start taking on: September 25, 2021 ?  ?methimazole 5 MG tablet ?Commonly known as: TAPAZOLE ?Take 1 tablet (5 mg total) by mouth daily. ?Start taking on: September 25, 2021 ?  ?omeprazole 40 MG capsule ?Commonly known as: PRILOSEC ?Take 40 mg by mouth daily. ?  ?ondansetron 8 MG tablet ?Commonly known as: ZOFRAN ?Take by mouth every 8 (eight) hours as needed for nausea or vomiting. ?  ?oxyCODONE-acetaminophen 10-325 MG tablet ?Commonly known as: PERCOCET ?Take 1 tablet by mouth every 6 (six) hours as needed for pain. ?What changed: when to take this ?  ?sodium chloride  0.65 % Soln nasal spray ?Commonly known as: OCEAN ?Place 1 spray into both nostrils as needed for congestion. ?  ?spironolactone 50 MG tablet ?Commonly known as: ALDACTONE ?Take 1 tablet (50 mg total) by mouth daily.

## 2021-09-24 NOTE — TOC Transition Note (Signed)
Transition of Care (TOC) - CM/SW Discharge Note ? ? ?Patient Details  ?Name: Jenise Westfall ?MRN: [id number] ?Date of Birth: 1950-10-05 ? ?Transition of Care (TOC) CM/SW Contact:  ?Alberteen Sam, LCSW ?Phone Number: ?09/24/2021, 12:04 PM ? ? ?Clinical Narrative:    ? ?Family at bedside to transport home today.  ? ?They report still working on getting patient's PCP info. They are aware once obtained to call PCP to set up her home health services with their agency preference in [place].  ? ?They are also aware to call Medical Records for any chart documents needed to transfer to [facility].  ? ?No further questions or concerns at this time.  ? ? ?Final next level of care: [place] ?Barriers to Discharge: No Barriers Identified ? ? ?Patient Goals and CMS Choice ?Patient states their goals for this hospitalization and ongoing recovery are:: to go home ?CMS Medicare.gov Compare Post Acute Care list provided to:: Patient ?Choice offered to / list presented to : Patient ? ?Discharge Placement ?  ?           ?  ?  ?  ?  ? ?Discharge Plan and Services ?  ?  ?           ?  ?  ?  ?  ?  ?  ?  ?  ?  ?  ? ?Social Determinants of Health (SDOH) Interventions ?  ? ? ?Readmission Risk Interventions ?No flowsheet data found. ? ? ? ? ?

## 2021-09-26 LAB — MISC LABCORP TEST (SEND OUT): Labcorp test code: 9985

## 2021-09-27 LAB — MISC LABCORP TEST (SEND OUT): Labcorp test code: 9985

## 2021-10-05 LAB — VDRL, CSF: VDRL Quant, CSF: NONREACTIVE

## 2021-10-11 ENCOUNTER — Inpatient Hospital Stay: Payer: Medicare Other | Admitting: Pulmonary Disease

## 2021-10-11 LAB — CULTURE, FUNGUS WITHOUT SMEAR

## 2021-10-14 ENCOUNTER — Ambulatory Visit: Payer: Medicare Other | Admitting: Medical

## 2021-10-20 ENCOUNTER — Inpatient Hospital Stay: Payer: Medicare Other | Admitting: Pulmonary Disease

## 2021-11-05 LAB — ACID FAST CULTURE WITH REFLEXED SENSITIVITIES (MYCOBACTERIA): Acid Fast Culture: NEGATIVE
